# Patient Record
Sex: Male | Born: 1940 | Race: White | Hispanic: No | Marital: Married | Smoking: Former smoker
Health system: Southern US, Community
[De-identification: ages and names within clinical notes are randomized; demographics above are authoritative.]

## PROBLEM LIST (undated history)

## (undated) DIAGNOSIS — R7989 Other specified abnormal findings of blood chemistry: Secondary | ICD-10-CM

## (undated) DIAGNOSIS — Z973 Presence of spectacles and contact lenses: Secondary | ICD-10-CM

## (undated) DIAGNOSIS — F0390 Unspecified dementia without behavioral disturbance: Secondary | ICD-10-CM

## (undated) DIAGNOSIS — R945 Abnormal results of liver function studies: Secondary | ICD-10-CM

## (undated) DIAGNOSIS — Z9889 Other specified postprocedural states: Secondary | ICD-10-CM

## (undated) DIAGNOSIS — M1990 Unspecified osteoarthritis, unspecified site: Secondary | ICD-10-CM

## (undated) DIAGNOSIS — M419 Scoliosis, unspecified: Secondary | ICD-10-CM

## (undated) DIAGNOSIS — T4145XA Adverse effect of unspecified anesthetic, initial encounter: Secondary | ICD-10-CM

## (undated) DIAGNOSIS — T8859XA Other complications of anesthesia, initial encounter: Secondary | ICD-10-CM

## (undated) DIAGNOSIS — R519 Headache, unspecified: Secondary | ICD-10-CM

## (undated) DIAGNOSIS — G4730 Sleep apnea, unspecified: Secondary | ICD-10-CM

## (undated) DIAGNOSIS — R42 Dizziness and giddiness: Secondary | ICD-10-CM

## (undated) DIAGNOSIS — Z9981 Dependence on supplemental oxygen: Secondary | ICD-10-CM

## (undated) DIAGNOSIS — J189 Pneumonia, unspecified organism: Secondary | ICD-10-CM

## (undated) DIAGNOSIS — F419 Anxiety disorder, unspecified: Secondary | ICD-10-CM

## (undated) DIAGNOSIS — C249 Malignant neoplasm of biliary tract, unspecified: Secondary | ICD-10-CM

## (undated) DIAGNOSIS — K589 Irritable bowel syndrome without diarrhea: Secondary | ICD-10-CM

## (undated) DIAGNOSIS — R569 Unspecified convulsions: Secondary | ICD-10-CM

## (undated) DIAGNOSIS — G959 Disease of spinal cord, unspecified: Secondary | ICD-10-CM

## (undated) DIAGNOSIS — R51 Headache: Secondary | ICD-10-CM

## (undated) DIAGNOSIS — K219 Gastro-esophageal reflux disease without esophagitis: Secondary | ICD-10-CM

## (undated) DIAGNOSIS — R112 Nausea with vomiting, unspecified: Secondary | ICD-10-CM

## (undated) DIAGNOSIS — E785 Hyperlipidemia, unspecified: Secondary | ICD-10-CM

## (undated) HISTORY — DX: Hyperlipidemia, unspecified: E78.5

## (undated) HISTORY — PX: COLONOSCOPY: SHX174

## (undated) HISTORY — PX: OSTECTOMY METATARSAL: SUR970

## (undated) HISTORY — PX: CHOLECYSTECTOMY: SHX55

## (undated) HISTORY — PX: FRACTURE SURGERY: SHX138

## (undated) HISTORY — PX: VASECTOMY: SHX75

## (undated) HISTORY — PX: UVULOPALATOPHARYNGOPLASTY (UPPP)/TONSILLECTOMY/SEPTOPLASTY: SHX6164

## (undated) HISTORY — PX: BACK SURGERY: SHX140

## (undated) HISTORY — DX: Dizziness and giddiness: R42

## (undated) HISTORY — PX: TONSILLECTOMY: SUR1361

## (undated) HISTORY — PX: CARDIAC CATHETERIZATION: SHX172

---
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
## 2005-02-22 ENCOUNTER — Emergency Department (HOSPITAL_COMMUNITY): Admission: EM | Admit: 2005-02-22 | Discharge: 2005-02-23 | Payer: Self-pay | Admitting: *Deleted

## 2005-06-29 ENCOUNTER — Emergency Department (HOSPITAL_COMMUNITY): Admission: EM | Admit: 2005-06-29 | Discharge: 2005-06-29 | Payer: Self-pay | Admitting: Emergency Medicine

## 2009-11-12 ENCOUNTER — Encounter: Admission: RE | Admit: 2009-11-12 | Discharge: 2009-11-12 | Payer: Self-pay | Admitting: Gastroenterology

## 2010-10-03 HISTORY — PX: OTHER SURGICAL HISTORY: SHX169

## 2015-05-08 ENCOUNTER — Ambulatory Visit: Payer: Self-pay | Admitting: Urology

## 2017-03-21 ENCOUNTER — Other Ambulatory Visit: Payer: Self-pay | Admitting: Orthopedic Surgery

## 2017-04-07 NOTE — Pre-Procedure Instructions (Signed)
Samuel Olson  04/07/2017      [id number] Angelina Sheriff, [place] [address] [place] [address] Phone: [phone] Fax: [phone]    Your procedure is scheduled on July 16  Report to [place] at 800 A.M.  Call this number if you have problems the morning of surgery:  [phone]   Remember:  Do not eat food or drink liquids after midnight.  Take these medicines the morning of surgery with A SIP OF WATER Baclofen (Lioresal), Buspirone (Buspar), Fluvoxamine (Luvox), Phenobarbital (Luminal), Phenytoin (Dilantin), ranitidine (Zantac)  Stop taking aspirin, BC's, Goody's, herbal medications, Fish Oil, Ibuprofen, Advil, Motrin, Aleve, Vitamins, Celebrex,    Do not wear jewelry, make-up or nail polish.  Do not wear lotions, powders, or perfumes, or deoderant.  Do not shave 48 hours prior to surgery.  Men may shave face and neck.  Do not bring valuables to the hospital.  [facility] is not responsible for any belongings or valuables.  Contacts, dentures or bridgework may not be worn into surgery.  Leave your suitcase in the car.  After surgery it may be brought to your room.  For patients admitted to the hospital, discharge time will be determined by your treatment team.  Patients discharged the day of surgery will not be allowed to drive home.   Special instructions:  [place] - Preparing for Surgery  Before surgery, you can play an important role.  Because skin is not sterile, your skin needs to be as free of germs as possible.  You can reduce the number of germs on you skin by washing with CHG (chlorahexidine gluconate) soap before surgery.  CHG is an antiseptic cleaner which kills germs and bonds with the skin to continue killing germs even after washing.  Please DO NOT use if you have an allergy to CHG or antibacterial soaps.  If your skin becomes reddened/irritated stop using the CHG and inform your  nurse when you arrive at Short Stay.  Do not shave (including legs and underarms) for at least 48 hours prior to the first CHG shower.  You may shave your face.  Please follow these instructions carefully:   1.  Shower with CHG Soap the night before surgery and the                                morning of Surgery.  2.  If you choose to wash your hair, wash your hair first as usual with your       normal shampoo.  3.  After you shampoo, rinse your hair and body thoroughly to remove the                      Shampoo.  4.  Use CHG as you would any other liquid soap.  You can apply chg directly       to the skin and wash gently with scrungie or a clean washcloth.  5.  Apply the CHG Soap to your body ONLY FROM THE NECK DOWN.        Do not use on open wounds or open sores.  Avoid contact with your eyes,       ears, mouth and genitals (private parts).  Wash genitals (private parts)       with your normal soap.  6.  Wash thoroughly, paying  special attention to the area where your surgery        will be performed.  7.  Thoroughly rinse your body with warm water from the neck down.  8.  DO NOT shower/wash with your normal soap after using and rinsing off       the CHG Soap.  9.  Pat yourself dry with a clean towel.            10.  Wear clean pajamas.            11.  Place clean sheets on your bed the night of your first shower and do not        sleep with pets.  Day of Surgery  Do not apply any lotions/deoderants the morning of surgery.  Please wear clean clothes to the hospital/surgery center.     Please read over the following fact sheets that you were given. Pain Booklet, Coughing and Deep Breathing, MRSA Information and Surgical Site Infection Prevention

## 2017-04-10 ENCOUNTER — Encounter (HOSPITAL_COMMUNITY): Payer: Self-pay | Admitting: *Deleted

## 2017-04-10 ENCOUNTER — Encounter (HOSPITAL_COMMUNITY): Payer: Self-pay

## 2017-04-10 ENCOUNTER — Encounter (HOSPITAL_COMMUNITY)
Admission: RE | Admit: 2017-04-10 | Discharge: 2017-04-10 | Disposition: A | Discharge status: Home / Self Care | Payer: Medicare Other | Source: Ambulatory Visit | Attending: Orthopedic Surgery | Admitting: Orthopedic Surgery

## 2017-04-10 DIAGNOSIS — Z01812 Encounter for preprocedural laboratory examination: Secondary | ICD-10-CM | POA: Diagnosis present

## 2017-04-10 DIAGNOSIS — M1711 Unilateral primary osteoarthritis, right knee: Secondary | ICD-10-CM | POA: Diagnosis not present

## 2017-04-10 HISTORY — DX: Gastro-esophageal reflux disease without esophagitis: K21.9

## 2017-04-10 HISTORY — DX: Other complications of anesthesia, initial encounter: T88.59XA

## 2017-04-10 HISTORY — DX: Scoliosis, unspecified: M41.9

## 2017-04-10 HISTORY — DX: Unspecified osteoarthritis, unspecified site: M19.90

## 2017-04-10 HISTORY — DX: Unspecified convulsions: R56.9

## 2017-04-10 HISTORY — DX: Other specified postprocedural states: Z98.890

## 2017-04-10 HISTORY — DX: Pneumonia, unspecified organism: J18.9

## 2017-04-10 HISTORY — DX: Nausea with vomiting, unspecified: R11.2

## 2017-04-10 HISTORY — DX: Adverse effect of unspecified anesthetic, initial encounter: T41.45XA

## 2017-04-10 HISTORY — DX: Irritable bowel syndrome, unspecified: K58.9

## 2017-04-10 HISTORY — DX: Anxiety disorder, unspecified: F41.9

## 2017-04-10 HISTORY — DX: Disease of spinal cord, unspecified: G95.9

## 2017-04-10 HISTORY — DX: Sleep apnea, unspecified: G47.30

## 2017-04-10 LAB — COMPREHENSIVE METABOLIC PANEL
ALT: 26 U/L (ref 17–63)
AST: 36 U/L (ref 15–41)
Albumin: 4.2 g/dL (ref 3.5–5.0)
Alkaline Phosphatase: 98 U/L (ref 38–126)
Anion gap: 8 (ref 5–15)
BILIRUBIN TOTAL: 0.4 mg/dL (ref 0.3–1.2)
BUN: 21 mg/dL — AB (ref 6–20)
CO2: 28 mmol/L (ref 22–32)
Calcium: 9.4 mg/dL (ref 8.9–10.3)
Chloride: 102 mmol/L (ref 101–111)
Creatinine, Ser: 0.81 mg/dL (ref 0.61–1.24)
GFR calc Af Amer: >60 mL/min (ref >60)
GLUCOSE: 86 mg/dL (ref 65–99)
POTASSIUM: 4.8 mmol/L (ref 3.5–5.1)
Sodium: 138 mmol/L (ref 135–145)
TOTAL PROTEIN: 7.4 g/dL (ref 6.5–8.1)

## 2017-04-10 LAB — CBC WITH DIFFERENTIAL/PLATELET
BASOS ABS: 0 10*3/uL (ref 0.0–0.1)
BASOS PCT: 1 %
Eosinophils Absolute: 0.1 10*3/uL (ref 0.0–0.7)
Eosinophils Relative: 2 %
HEMATOCRIT: 43.7 % (ref 39.0–52.0)
HEMOGLOBIN: 13.6 g/dL (ref 13.0–17.0)
LYMPHS PCT: 23 %
Lymphs Abs: 1.1 10*3/uL (ref 0.7–4.0)
MCH: 29.5 pg (ref 26.0–34.0)
MCHC: 31.1 g/dL (ref 30.0–36.0)
MCV: 94.8 fL (ref 78.0–100.0)
Monocytes Absolute: 0.3 10*3/uL (ref 0.1–1.0)
Monocytes Relative: 7 %
NEUTROS ABS: 3.2 10*3/uL (ref 1.7–7.7)
NEUTROS PCT: 67 %
Platelets: 173 10*3/uL (ref 150–400)
RBC: 4.61 MIL/uL (ref 4.22–5.81)
RDW: 14.3 % (ref 11.5–15.5)
WBC: 4.8 10*3/uL (ref 4.0–10.5)

## 2017-04-10 LAB — SURGICAL PCR SCREEN
MRSA, PCR: NEGATIVE
Staphylococcus aureus: NEGATIVE

## 2017-04-10 NOTE — Progress Notes (Signed)
PCP -  Elyn Aquas Cardiologist - Denies  Chest x-ray - Denies EKG - Denies Stress Test - Denies ECHO - Denies Cardiac Cath - Yes, but years ago, per the wife  Sleep Study - Yes CPAP - Denies, but uses 2L of O2 while sleeping due to him not tolerating the cpap machine.   Pt denies having chest pain, sob, or fever at this time. All instructions explained to the pt and wife, with a verbal understanding of the material. Pt and wife agree to go over the instructions while at home for a better understanding. The opportunity to ask questions was provided.

## 2017-04-14 MED ORDER — ACETAMINOPHEN 500 MG PO TABS
1000.0000 mg | ORAL_TABLET | Freq: Once | ORAL | Status: AC
  Administered 2017-04-17: 1000 mg via ORAL
  Filled 2017-04-14: qty 2

## 2017-04-14 MED ORDER — DEXAMETHASONE SODIUM PHOSPHATE 10 MG/ML IJ SOLN
8.0000 mg | Freq: Once | INTRAMUSCULAR | Status: AC
  Administered 2017-04-17: 8 mg via INTRAVENOUS
  Filled 2017-04-14: qty 1

## 2017-04-14 MED ORDER — BUPIVACAINE LIPOSOME 1.3 % IJ SUSP
20.0000 mL | INTRAMUSCULAR | Status: AC
  Administered 2017-04-17: 20 mL
  Filled 2017-04-14: qty 20

## 2017-04-14 MED ORDER — TRANEXAMIC ACID 1000 MG/10ML IV SOLN
1000.0000 mg | INTRAVENOUS | Status: AC
  Administered 2017-04-17: 1000 mg via INTRAVENOUS
  Filled 2017-04-14: qty 10

## 2017-04-14 MED ORDER — GABAPENTIN 300 MG PO CAPS
300.0000 mg | ORAL_CAPSULE | Freq: Once | ORAL | Status: AC
  Administered 2017-04-17: 300 mg via ORAL
  Filled 2017-04-14: qty 1

## 2017-04-16 NOTE — H&P (Signed)
Samuel Olson MRN:  [id number] DOB/SEX:  Jul 18, 1941/male  CHIEF COMPLAINT:  Painful right Knee  HISTORY: Patient is a 76 y.o. male presented with a history of pain in the right knee. Onset of symptoms was gradual starting a few years ago with gradually worsening course since that time. Patient has been treated conservatively with over-the-counter NSAIDs and activity modification. Patient currently rates pain in the knee at 10 out of 10 with activity. There is pain at night.  PAST MEDICAL HISTORY: There are no active problems to display for this patient.  Past Medical History:  Diagnosis Date  . Anxiety   . Arthritis   . 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 Complication of anesthesia   . GERD (gastroesophageal reflux disease)   . IBS (irritable bowel syndrome)   . Pneumonia   . PONV (postoperative nausea and vomiting)   . Scoliosis   . Seizures ([place])   . Sleep apnea    Uses O2 at 2L while sleeping   Past Surgical History:  Procedure Laterality Date  . BACK SURGERY    . CARDIAC CATHETERIZATION    . CHOLECYSTECTOMY    . COLONOSCOPY    . FRACTURE SURGERY     Left plates and screws  . pain stimulator   2012  . pain stimulator   2012   removed 2012  . TONSILLECTOMY    . UVULOPALATOPHARYNGOPLASTY (UPPP)/TONSILLECTOMY/SEPTOPLASTY    . VASECTOMY       MEDICATIONS:   No prescriptions prior to admission.    ALLERGIES:   Allergies  Allergen Reactions  . Promethazine Other (See Comments)    Cramps, shakes.  . Augmentin [Amoxicillin-Pot Clavulanate] Other (See Comments)    UNKNOWN [SEE BELOW]  Has patient had a PCN reaction causing immediate rash, facial/tongue/throat swelling, SOB or lightheadedness with hypotension: Unknown Has patient had a PCN reaction causing severe rash involving mucus membranes or skin necrosis: Uknown PATIENT HAS HAD A PCN REACTION THAT REQUIRED HOSPITALIZATION: >> UNSPECIFIED REACTION WHILE HE WAS ALREADY ADMITTED TO A HOSPITAL.  Has patient had a PCN  reaction occurring within the last 10 years: No   . Oxycodone Other (See Comments)    UNSPECIFIED REACTION  TOLERATES APAP WITHOUT OXYCODONE    REVIEW OF SYSTEMS:  A comprehensive review of systems was negative except for: Musculoskeletal: positive for arthralgias and bone pain   FAMILY HISTORY:  No family history on file.  SOCIAL HISTORY:   Social History  Substance Use Topics  . Smoking status: Former Smoker    Types: Cigarettes  . Smokeless tobacco: Never Used  . Alcohol use No     EXAMINATION:  Vital signs in last 24 hours:    There were no vitals taken for this visit.  General Appearance:    Alert, cooperative, no distress, appears stated age  Head:    Normocephalic, without obvious abnormality, atraumatic  Eyes:    PERRL, conjunctiva/corneas clear, EOM's intact, fundi    benign, both eyes       Ears:    Normal TM's and external ear canals, both ears  Nose:   Nares normal, septum midline, mucosa normal, no drainage    or sinus tenderness  Throat:   Lips, mucosa, and tongue normal; teeth and gums normal  Neck:   Supple, symmetrical, trachea midline, no adenopathy;       thyroid:  No enlargement/tenderness/nodules; no carotid   bruit or JVD  Back:     Symmetric, no curvature, ROM normal, no CVA tenderness  Lungs:     Clear to auscultation bilaterally, respirations unlabored  Chest wall:    No tenderness or deformity  Heart:    Regular rate and rhythm, S1 and S2 normal, no murmur, rub   or gallop  Abdomen:     Soft, non-tender, bowel sounds active all four quadrants,    no masses, no organomegaly  Genitalia:    Normal male without lesion, discharge or tenderness  Rectal:    Normal tone, normal prostate, no masses or tenderness;   guaiac negative stool  Extremities:   Extremities normal, atraumatic, no cyanosis or edema  Pulses:   2+ and symmetric all extremities  Skin:   Skin color, texture, turgor normal, no rashes or lesions  Lymph nodes:   Cervical,  supraclavicular, and axillary nodes normal  Neurologic:   CNII-XII intact. Normal strength, sensation and reflexes      throughout    Musculoskeletal:  ROM 0-120, Ligaments intact,  Imaging Review Plain radiographs demonstrate severe degenerative joint disease of the right knee medial compartment. The overall alignment is mild varus. The bone quality appears to be excellent for age and reported activity level.  Assessment/Plan: Primary osteoarthritis medical compartment, right knee   The patient history, physical examination and imaging studies are consistent with advanced degenerative joint disease of the right knee medial compartment. The patient has failed conservative treatment.  The clearance notes were reviewed.  After discussion with the patient it was felt that partial Knee Replacement was indicated. The procedure,  risks, and benefits of partial knee arthroplasty were presented and reviewed. The risks including but not limited to aseptic loosening, infection, blood clots, vascular injury, stiffness, patella tracking problems complications among others were discussed. The patient acknowledged the explanation, agreed to proceed with the plan.  Donia Ast 04/16/2017, 8:44 PM

## 2017-04-17 ENCOUNTER — Encounter (HOSPITAL_COMMUNITY)
Admission: RE | Disposition: A | Discharge status: Home / Self Care | Payer: Self-pay | Source: Ambulatory Visit | Attending: Orthopedic Surgery

## 2017-04-17 ENCOUNTER — Ambulatory Visit (HOSPITAL_COMMUNITY): Payer: Medicare Other | Admitting: Anesthesiology

## 2017-04-17 ENCOUNTER — Encounter (HOSPITAL_COMMUNITY): Payer: Self-pay

## 2017-04-17 ENCOUNTER — Observation Stay (HOSPITAL_COMMUNITY)
Admission: RE | Admit: 2017-04-17 | Discharge: 2017-04-18 | Disposition: A | Discharge status: Home / Self Care | Payer: Medicare Other | Source: Ambulatory Visit | Attending: Orthopedic Surgery | Admitting: Orthopedic Surgery

## 2017-04-17 DIAGNOSIS — Z79899 Other long term (current) drug therapy: Secondary | ICD-10-CM | POA: Diagnosis not present

## 2017-04-17 DIAGNOSIS — F419 Anxiety disorder, unspecified: Secondary | ICD-10-CM | POA: Insufficient documentation

## 2017-04-17 DIAGNOSIS — M1711 Unilateral primary osteoarthritis, right knee: Secondary | ICD-10-CM | POA: Diagnosis not present

## 2017-04-17 DIAGNOSIS — K219 Gastro-esophageal reflux disease without esophagitis: Secondary | ICD-10-CM | POA: Diagnosis not present

## 2017-04-17 DIAGNOSIS — Z87891 Personal history of nicotine dependence: Secondary | ICD-10-CM | POA: Insufficient documentation

## 2017-04-17 DIAGNOSIS — Z791 Long term (current) use of non-steroidal anti-inflammatories (NSAID): Secondary | ICD-10-CM | POA: Diagnosis not present

## 2017-04-17 DIAGNOSIS — M25561 Pain in right knee: Secondary | ICD-10-CM | POA: Diagnosis present

## 2017-04-17 DIAGNOSIS — G4730 Sleep apnea, unspecified: Secondary | ICD-10-CM | POA: Diagnosis not present

## 2017-04-17 DIAGNOSIS — Z9981 Dependence on supplemental oxygen: Secondary | ICD-10-CM | POA: Diagnosis not present

## 2017-04-17 DIAGNOSIS — Z96651 Presence of right artificial knee joint: Secondary | ICD-10-CM

## 2017-04-17 HISTORY — PX: PARTIAL KNEE ARTHROPLASTY: SHX2174

## 2017-04-17 HISTORY — DX: Dependence on supplemental oxygen: Z99.81

## 2017-04-17 HISTORY — PX: REPLACEMENT UNICONDYLAR JOINT KNEE: SUR1227

## 2017-04-17 SURGERY — ARTHROPLASTY, KNEE, UNICOMPARTMENTAL
Anesthesia: Spinal | Site: Knee | Laterality: Right

## 2017-04-17 MED ORDER — MENTHOL 3 MG MT LOZG: 1.0000 | LOZENGE | OROMUCOSAL | Status: DC | PRN

## 2017-04-17 MED ORDER — LACTATED RINGERS IV SOLN
INTRAVENOUS | Status: DC
  Administered 2017-04-17 (×2): via INTRAVENOUS

## 2017-04-17 MED ORDER — ROPIVACAINE HCL 5 MG/ML IJ SOLN
INTRAMUSCULAR | Status: DC | PRN
  Administered 2017-04-17: 30 mL via PERINEURAL

## 2017-04-17 MED ORDER — FLEET ENEMA 7-19 GM/118ML RE ENEM: 1.0000 | ENEMA | Freq: Once | RECTAL | Status: DC | PRN

## 2017-04-17 MED ORDER — ZOLPIDEM TARTRATE 5 MG PO TABS
5.0000 mg | ORAL_TABLET | Freq: Every evening | ORAL | Status: DC | PRN
Start: 2017-04-17 — End: 2017-04-18

## 2017-04-17 MED ORDER — EPHEDRINE SULFATE 50 MG/ML IJ SOLN
INTRAMUSCULAR | Status: DC | PRN
  Administered 2017-04-17 (×6): 5 mg via INTRAVENOUS

## 2017-04-17 MED ORDER — DEXAMETHASONE SODIUM PHOSPHATE 10 MG/ML IJ SOLN
10.0000 mg | Freq: Once | INTRAMUSCULAR | Status: AC
  Administered 2017-04-18: 10 mg via INTRAVENOUS
  Filled 2017-04-17: qty 1

## 2017-04-17 MED ORDER — ALUM & MAG HYDROXIDE-SIMETH 200-200-20 MG/5ML PO SUSP: 30.0000 mL | ORAL | Status: DC | PRN

## 2017-04-17 MED ORDER — MIDAZOLAM HCL 2 MG/2ML IJ SOLN
INTRAMUSCULAR | Status: AC
  Administered 2017-04-17: 1 mg via INTRAVENOUS
  Filled 2017-04-17: qty 2

## 2017-04-17 MED ORDER — FENTANYL CITRATE (PF) 100 MCG/2ML IJ SOLN
100.0000 ug | Freq: Once | INTRAMUSCULAR | Status: AC
  Administered 2017-04-17: 100 ug via INTRAVENOUS

## 2017-04-17 MED ORDER — PHENYTOIN SODIUM EXTENDED 100 MG PO CAPS
100.0000 mg | ORAL_CAPSULE | Freq: Two times a day (BID) | ORAL | Status: DC
  Administered 2017-04-17 – 2017-04-18 (×2): 100 mg via ORAL
  Filled 2017-04-17 (×2): qty 1

## 2017-04-17 MED ORDER — PROPOFOL 10 MG/ML IV BOLUS
INTRAVENOUS | Status: DC | PRN
  Administered 2017-04-17: 150 mg via INTRAVENOUS

## 2017-04-17 MED ORDER — LIDOCAINE HCL (CARDIAC) 20 MG/ML IV SOLN
INTRAVENOUS | Status: AC
  Filled 2017-04-17: qty 5

## 2017-04-17 MED ORDER — SIMVASTATIN 40 MG PO TABS
40.0000 mg | ORAL_TABLET | Freq: Every evening | ORAL | Status: DC
  Administered 2017-04-17: 40 mg via ORAL
  Filled 2017-04-17: qty 1

## 2017-04-17 MED ORDER — CELECOXIB 200 MG PO CAPS
200.0000 mg | ORAL_CAPSULE | Freq: Two times a day (BID) | ORAL | Status: DC
  Administered 2017-04-17 – 2017-04-18 (×2): 200 mg via ORAL
  Filled 2017-04-17 (×2): qty 1

## 2017-04-17 MED ORDER — MIDAZOLAM HCL 2 MG/2ML IJ SOLN
INTRAMUSCULAR | Status: AC
  Filled 2017-04-17: qty 2

## 2017-04-17 MED ORDER — FAMOTIDINE 20 MG PO TABS
40.0000 mg | ORAL_TABLET | Freq: Every day | ORAL | Status: DC
  Administered 2017-04-17 – 2017-04-18 (×2): 40 mg via ORAL
  Filled 2017-04-17 (×2): qty 2

## 2017-04-17 MED ORDER — CHLORHEXIDINE GLUCONATE 4 % EX LIQD: 60.0000 mL | Freq: Once | CUTANEOUS | Status: DC

## 2017-04-17 MED ORDER — HYDROMORPHONE HCL 1 MG/ML IJ SOLN: 1.0000 mg | INTRAMUSCULAR | Status: DC | PRN

## 2017-04-17 MED ORDER — FLUVOXAMINE MALEATE 100 MG PO TABS
100.0000 mg | ORAL_TABLET | Freq: Every day | ORAL | Status: DC
  Administered 2017-04-17 – 2017-04-18 (×2): 100 mg via ORAL
  Filled 2017-04-17 (×2): qty 1

## 2017-04-17 MED ORDER — MIDAZOLAM HCL 2 MG/2ML IJ SOLN
1.0000 mg | Freq: Once | INTRAMUSCULAR | Status: AC
  Administered 2017-04-17: 1 mg via INTRAVENOUS

## 2017-04-17 MED ORDER — FENTANYL CITRATE (PF) 100 MCG/2ML IJ SOLN
INTRAMUSCULAR | Status: AC
  Filled 2017-04-17: qty 2

## 2017-04-17 MED ORDER — METOCLOPRAMIDE HCL 5 MG/ML IJ SOLN: 5.0000 mg | Freq: Three times a day (TID) | INTRAMUSCULAR | Status: DC | PRN

## 2017-04-17 MED ORDER — EPHEDRINE 5 MG/ML INJ
INTRAVENOUS | Status: AC
  Filled 2017-04-17: qty 10

## 2017-04-17 MED ORDER — ACETAMINOPHEN 325 MG PO TABS: 650.0000 mg | ORAL_TABLET | Freq: Four times a day (QID) | ORAL | Status: DC | PRN

## 2017-04-17 MED ORDER — METHOCARBAMOL 1000 MG/10ML IJ SOLN: 500.0000 mg | Freq: Four times a day (QID) | INTRAMUSCULAR | Status: DC | PRN

## 2017-04-17 MED ORDER — ACETAMINOPHEN 650 MG RE SUPP: 650.0000 mg | Freq: Four times a day (QID) | RECTAL | Status: DC | PRN

## 2017-04-17 MED ORDER — SENNOSIDES-DOCUSATE SODIUM 8.6-50 MG PO TABS: 1.0000 | ORAL_TABLET | Freq: Every evening | ORAL | Status: DC | PRN

## 2017-04-17 MED ORDER — SODIUM CHLORIDE 0.9 % IJ SOLN
INTRAMUSCULAR | Status: DC | PRN
  Administered 2017-04-17: 20 mL via INTRAVENOUS

## 2017-04-17 MED ORDER — FENTANYL CITRATE (PF) 100 MCG/2ML IJ SOLN
INTRAMUSCULAR | Status: DC | PRN
  Administered 2017-04-17: 25 ug via INTRAVENOUS

## 2017-04-17 MED ORDER — DOCUSATE SODIUM 100 MG PO CAPS
100.0000 mg | ORAL_CAPSULE | Freq: Two times a day (BID) | ORAL | Status: DC
  Administered 2017-04-17 – 2017-04-18 (×2): 100 mg via ORAL
  Filled 2017-04-17 (×2): qty 1

## 2017-04-17 MED ORDER — MEPERIDINE HCL 25 MG/ML IJ SOLN: 6.2500 mg | INTRAMUSCULAR | Status: DC | PRN

## 2017-04-17 MED ORDER — HYDROCODONE-ACETAMINOPHEN 7.5-325 MG PO TABS
1.0000 | ORAL_TABLET | Freq: Four times a day (QID) | ORAL | Status: DC
  Administered 2017-04-17 – 2017-04-18 (×4): 1 via ORAL
  Filled 2017-04-17 (×4): qty 1

## 2017-04-17 MED ORDER — ONDANSETRON HCL 4 MG/2ML IJ SOLN
INTRAMUSCULAR | Status: DC | PRN
  Administered 2017-04-17: 4 mg via INTRAVENOUS

## 2017-04-17 MED ORDER — METOCLOPRAMIDE HCL 5 MG PO TABS: 5.0000 mg | ORAL_TABLET | Freq: Three times a day (TID) | ORAL | Status: DC | PRN

## 2017-04-17 MED ORDER — BUPIVACAINE-EPINEPHRINE (PF) 0.25% -1:200000 IJ SOLN
INTRAMUSCULAR | Status: DC | PRN
  Administered 2017-04-17: 30 mL via PERINEURAL

## 2017-04-17 MED ORDER — 0.9 % SODIUM CHLORIDE (POUR BTL) OPTIME
TOPICAL | Status: DC | PRN
  Administered 2017-04-17: 3000 mL
  Administered 2017-04-17: 1000 mL

## 2017-04-17 MED ORDER — PHENOL 1.4 % MT LIQD: 1.0000 | OROMUCOSAL | Status: DC | PRN

## 2017-04-17 MED ORDER — CEFAZOLIN SODIUM-DEXTROSE 2-4 GM/100ML-% IV SOLN
INTRAVENOUS | Status: AC
  Filled 2017-04-17: qty 100

## 2017-04-17 MED ORDER — ONDANSETRON HCL 4 MG/2ML IJ SOLN
INTRAMUSCULAR | Status: AC
  Filled 2017-04-17: qty 2

## 2017-04-17 MED ORDER — BUSPIRONE HCL 5 MG PO TABS
5.0000 mg | ORAL_TABLET | Freq: Two times a day (BID) | ORAL | Status: DC
  Administered 2017-04-17 – 2017-04-18 (×2): 5 mg via ORAL
  Filled 2017-04-17 (×2): qty 1

## 2017-04-17 MED ORDER — CEFAZOLIN SODIUM-DEXTROSE 2-4 GM/100ML-% IV SOLN
2.0000 g | INTRAVENOUS | Status: AC
  Administered 2017-04-17: 2 g via INTRAVENOUS

## 2017-04-17 MED ORDER — BISACODYL 5 MG PO TBEC: 5.0000 mg | DELAYED_RELEASE_TABLET | Freq: Every day | ORAL | Status: DC | PRN

## 2017-04-17 MED ORDER — DIPHENHYDRAMINE HCL 12.5 MG/5ML PO ELIX: 12.5000 mg | ORAL_SOLUTION | ORAL | Status: DC | PRN

## 2017-04-17 MED ORDER — ONDANSETRON HCL 4 MG/2ML IJ SOLN: 4.0000 mg | Freq: Four times a day (QID) | INTRAMUSCULAR | Status: DC | PRN

## 2017-04-17 MED ORDER — ONDANSETRON HCL 4 MG PO TABS: 4.0000 mg | ORAL_TABLET | Freq: Four times a day (QID) | ORAL | Status: DC | PRN

## 2017-04-17 MED ORDER — TRANEXAMIC ACID 1000 MG/10ML IV SOLN
1000.0000 mg | Freq: Once | INTRAVENOUS | Status: AC
  Administered 2017-04-17: 1000 mg via INTRAVENOUS
  Filled 2017-04-17: qty 10

## 2017-04-17 MED ORDER — CLINDAMYCIN PHOSPHATE 600 MG/50ML IV SOLN
600.0000 mg | Freq: Four times a day (QID) | INTRAVENOUS | Status: AC
  Administered 2017-04-17 – 2017-04-18 (×2): 600 mg via INTRAVENOUS
  Filled 2017-04-17 (×2): qty 50

## 2017-04-17 MED ORDER — LIDOCAINE 2% (20 MG/ML) 5 ML SYRINGE
INTRAMUSCULAR | Status: DC | PRN
  Administered 2017-04-17: 100 mg via INTRAVENOUS

## 2017-04-17 MED ORDER — FENTANYL CITRATE (PF) 100 MCG/2ML IJ SOLN
INTRAMUSCULAR | Status: AC
  Administered 2017-04-17: 100 ug via INTRAVENOUS
  Filled 2017-04-17: qty 2

## 2017-04-17 MED ORDER — ASPIRIN EC 325 MG PO TBEC
325.0000 mg | DELAYED_RELEASE_TABLET | Freq: Two times a day (BID) | ORAL | Status: DC
  Administered 2017-04-17 – 2017-04-18 (×2): 325 mg via ORAL
  Filled 2017-04-17 (×2): qty 1

## 2017-04-17 MED ORDER — FENTANYL CITRATE (PF) 100 MCG/2ML IJ SOLN
25.0000 ug | INTRAMUSCULAR | Status: DC | PRN
  Administered 2017-04-17 (×2): 50 ug via INTRAVENOUS

## 2017-04-17 MED ORDER — PHENOBARBITAL 32.4 MG PO TABS
64.8000 mg | ORAL_TABLET | Freq: Two times a day (BID) | ORAL | Status: DC
  Administered 2017-04-17 – 2017-04-18 (×2): 64.8 mg via ORAL
  Filled 2017-04-17 (×3): qty 2

## 2017-04-17 MED ORDER — GABAPENTIN 300 MG PO CAPS
300.0000 mg | ORAL_CAPSULE | Freq: Three times a day (TID) | ORAL | Status: DC
  Administered 2017-04-17 – 2017-04-18 (×3): 300 mg via ORAL
  Filled 2017-04-17 (×3): qty 1

## 2017-04-17 MED ORDER — METHOCARBAMOL 500 MG PO TABS
500.0000 mg | ORAL_TABLET | Freq: Four times a day (QID) | ORAL | Status: DC | PRN
  Administered 2017-04-17 – 2017-04-18 (×2): 500 mg via ORAL
  Filled 2017-04-17 (×2): qty 1

## 2017-04-17 MED ORDER — FENTANYL CITRATE (PF) 250 MCG/5ML IJ SOLN
INTRAMUSCULAR | Status: AC
  Filled 2017-04-17: qty 5

## 2017-04-17 SURGICAL SUPPLY — 63 items
BANDAGE ACE 6X5 VEL STRL LF (GAUZE/BANDAGES/DRESSINGS) ×3 IMPLANT
BANDAGE ESMARK 6X9 LF (GAUZE/BANDAGES/DRESSINGS) ×1 IMPLANT
BEARING TIBIAL IBALANCE SZ5 8M (Miscellaneous) ×3 IMPLANT
BLADE SAW RECIP 87.9 MT (BLADE) IMPLANT
BLADE SAW SGTL 13X75X1.27 (BLADE) IMPLANT
BLADE SAW SGTL 83.5X18.5 (BLADE) IMPLANT
BNDG CMPR MED 10X6 ELC LF (GAUZE/BANDAGES/DRESSINGS) ×1
BNDG ELASTIC 6X10 VLCR STRL LF (GAUZE/BANDAGES/DRESSINGS) ×3 IMPLANT
BNDG ESMARK 6X9 LF (GAUZE/BANDAGES/DRESSINGS) ×3
BOWL SMART MIX CTS (DISPOSABLE) ×3 IMPLANT
CEMENT BONE SIMPLEX SPEEDSET (Cement) ×3 IMPLANT
CLOSURE STERI-STRIP 1/2X4 (GAUZE/BANDAGES/DRESSINGS) ×1
CLSR STERI-STRIP ANTIMIC 1/2X4 (GAUZE/BANDAGES/DRESSINGS) ×2 IMPLANT
COMP FEM CEMENT IBALANCE SZ5 (Knees) ×3 IMPLANT
COMPONENT FEM CMNT IBALNCE SZ5 (Knees) ×1 IMPLANT
COVER SURGICAL LIGHT HANDLE (MISCELLANEOUS) ×6 IMPLANT
CUFF TOURNIQUET SINGLE 34IN LL (TOURNIQUET CUFF) ×3 IMPLANT
DRAPE EXTREMITY T 121X128X90 (DRAPE) ×3 IMPLANT
DRAPE HALF SHEET 40X57 (DRAPES) ×3 IMPLANT
DRAPE IMP U-DRAPE 54X76 (DRAPES) ×3 IMPLANT
DRAPE INCISE IOBAN 66X45 STRL (DRAPES) ×6 IMPLANT
DRAPE U-SHAPE 47X51 STRL (DRAPES) ×3 IMPLANT
DRSG AQUACEL AG ADV 3.5X10 (GAUZE/BANDAGES/DRESSINGS) ×3 IMPLANT
DURAPREP 26ML APPLICATOR (WOUND CARE) ×6 IMPLANT
ELECT REM PT RETURN 9FT ADLT (ELECTROSURGICAL) ×3
ELECTRODE REM PT RTRN 9FT ADLT (ELECTROSURGICAL) ×1 IMPLANT
FLUID NSS /IRRIG 3000 ML XXX (IV SOLUTION) ×3 IMPLANT
GLOVE BIOGEL M 7.0 STRL (GLOVE) IMPLANT
GLOVE BIOGEL PI IND STRL 7.5 (GLOVE) IMPLANT
GLOVE BIOGEL PI IND STRL 8.5 (GLOVE) ×4 IMPLANT
GLOVE BIOGEL PI INDICATOR 7.5 (GLOVE)
GLOVE BIOGEL PI INDICATOR 8.5 (GLOVE) ×8
GLOVE SURG ORTHO 8.0 STRL STRW (GLOVE) ×12 IMPLANT
GOWN STRL REUS W/ TWL LRG LVL3 (GOWN DISPOSABLE) ×1 IMPLANT
GOWN STRL REUS W/ TWL XL LVL3 (GOWN DISPOSABLE) ×2 IMPLANT
GOWN STRL REUS W/TWL 2XL LVL3 (GOWN DISPOSABLE) ×3 IMPLANT
GOWN STRL REUS W/TWL LRG LVL3 (GOWN DISPOSABLE) ×2
GOWN STRL REUS W/TWL XL LVL3 (GOWN DISPOSABLE) ×4
HANDPIECE INTERPULSE COAX TIP (DISPOSABLE) ×3
HOOD PEEL AWAY FACE SHEILD DIS (HOOD) ×9 IMPLANT
KIT BASIN OR (CUSTOM PROCEDURE TRAY) ×3 IMPLANT
KIT ROOM TURNOVER OR (KITS) ×3 IMPLANT
MANIFOLD NEPTUNE II (INSTRUMENTS) ×3 IMPLANT
NEEDLE 21X1 OR PACK (NEEDLE) ×3 IMPLANT
NEEDLE HYPO 21X1 ECLIPSE (NEEDLE) ×3 IMPLANT
NS IRRIG 1000ML POUR BTL (IV SOLUTION) ×3 IMPLANT
PACK TOTAL JOINT (CUSTOM PROCEDURE TRAY) ×3 IMPLANT
PAD ARMBOARD 7.5X6 YLW CONV (MISCELLANEOUS) ×6 IMPLANT
SET HNDPC FAN SPRY TIP SCT (DISPOSABLE) ×1 IMPLANT
SUCTION FRAZIER HANDLE 10FR (MISCELLANEOUS) ×2
SUCTION TUBE FRAZIER 10FR DISP (MISCELLANEOUS) ×1 IMPLANT
SUT MNCRL AB 3-0 PS2 18 (SUTURE) ×3 IMPLANT
SUT VIC AB 0 CT1 27 (SUTURE) ×3
SUT VIC AB 0 CT1 27XBRD ANBCTR (SUTURE) ×1 IMPLANT
SUT VIC AB 1 CT1 27 (SUTURE) ×3
SUT VIC AB 1 CT1 27XBRD ANBCTR (SUTURE) ×1 IMPLANT
SUT VIC AB 2-0 CT1 27 (SUTURE) ×2
SUT VIC AB 2-0 CT1 TAPERPNT 27 (SUTURE) ×1 IMPLANT
SYR 20CC LL (SYRINGE) ×6 IMPLANT
TOWEL OR 17X24 6PK STRL BLUE (TOWEL DISPOSABLE) ×3 IMPLANT
TOWEL OR 17X26 10 PK STRL BLUE (TOWEL DISPOSABLE) ×3 IMPLANT
TRAY TIBIAL UKA SZ5 RIGHT MED (Knees) ×3 IMPLANT
WRAP KNEE MAXI GEL POST OP (GAUZE/BANDAGES/DRESSINGS) ×3 IMPLANT

## 2017-04-17 NOTE — Progress Notes (Signed)
Orthopedic Tech Progress Note Patient Details:  Samuel Olson 01-02-1941 [id number]  CPM Right Knee CPM Right Knee: On Right Knee Flexion (Degrees): 90 Right Knee Extension (Degrees): 0 Additional Comments: trapeze bar patient helper   [name] 04/17/2017, 1:58 PM Viewed order from doctor's order list

## 2017-04-17 NOTE — Op Note (Addendum)
UNI KNEE REPLACEMENT OPERATIVE NOTE:  04/17/2017  2:18 PM  PATIENT:  Samuel Olson  76 y.o. male  PRE-OPERATIVE DIAGNOSIS:  primary osteoarthritis right knee  POST-OPERATIVE DIAGNOSIS:  primary osteoarthritis right knee  PROCEDURE:  Procedure(s): UNICOMPARTMENTAL KNEE  SURGEON:  Surgeon(s): Vickey Huger, MD  PHYSICIAN ASSISTANT: Carlyon Shadow, [facility]   ANESTHESIA:   spinal  DRAINS: Hemovac  SPECIMEN: None  COUNTS:  Correct  TOURNIQUET:   Total Tourniquet Time Documented: Thigh (Right) - 44 minutes Total: Thigh (Right) - 44 minutes   DICTATION:  Indication for procedure:    The patient is a 76 y.o. male who has failed conservative treatment for primary osteoarthritis right knee.  Informed consent was obtained prior to anesthesia. The risks versus benefits of the operation were explain and in a way the patient can, and did, understand.   On the implant demand matching protocol, this patient scored 10.  Therefore, this patient was not receive a polyethylene insert with vitamin E which is a high demand implant.  Description of procedure:     The patient was taken to the operating room and placed under anesthesia.  The patient was positioned in the usual fashion taking care that all body parts were adequately padded and/or protected.  I foley catheter was not placed.  A tourniquet was applied and the leg prepped and draped in the usual sterile fashion.  The extremity was exsanguinated with the esmarch and tourniquet inflated to 350 mmHg.  Pre-operative range of motion was normal.  The knee was in 5 degree of mild varus.  A midline incision approximately 3-4 inches long was made with a #10 blade.  A new blade was used to make a parapatellar arthrotomy going 1 cm into the quadriceps tendon, over the patella, and alongside the medial aspect of the patellar tendon.  A synovectomy was then performed with the #10 blade and forceps. I then elevated the deep MCL off the medial tibial flare.  The knee was put at 90 degrees and the patient specific cutting blocks were used to make our proximal tibial cut and distal femoral cut. The medial meniscus was removed at this point.  I then used the #5 cutting guide on the femur to drill for lugs and cut the chamfers. Likewise, a #5  tibial baseplate was used to prepare the tibia. I then trialed the #5 frmur and #5 tibia. I trialed several poly inserts and a 8 mm achieved good balance in flexion and extension.  I then irrigated copiously and then mixed the cement. I injected exparel in the deep soft tissues at this point. I then cemented the tibia first followed by the femur and removed excess cement and then inserted the polyethylene. I placed the leg in extension and finished injecting the rest of the exparel.  BLOOD LOSS:  300cc DRAINS: 1 hemovac, 1 pain catheter COMPLICATIONS:  None.  PLAN OF CARE: Admit to inpatient   PATIENT DISPOSITION:  PACU - hemodynamically stable.   Delay start of Pharmacological VTE agent (>24hrs) due to surgical blood loss or risk of bleeding:  not applicable  Please fax a copy of this op note to my office at [phone] (please only include page 1 and 2 of the Case Information op note)

## 2017-04-17 NOTE — Evaluation (Addendum)
Physical Therapy Evaluation Patient Details Name: Samuel Olson MRN: [id number] DOB: September 01, 1941 Today's Date: 04/17/2017   History of Present Illness  Patient is a 76 y/o male who presents s/p Right unicompartmental knee replacement. PMH includes seizures, sleep apnea, anxiety, cervical myelopathy.  Clinical Impression  Patient presents with pain and post surgical deficits s/p above surgery. Tolerated gait training with Min guard assist for safety and cues for RW management. Instructed pt in exercises. Pt has support of spouse at home. Pt independent and volunteers 6 days/week PTA. Lengthy discussion re: problem solving bathroom setup, how to use BSC over toilet, get in/out of bed, knee precautions, zero degree knee, exercises etc. Plan for stair training and bed mobility tomorrow prior to d/c. Also will plan to bring exercise handout. Will follow to maximize independence and mobility prior to return home.     Follow Up Recommendations DC plan and follow up therapy as arranged by surgeon;Supervision for mobility/OOB    Equipment Recommendations  None recommended by PT    Recommendations for Other Services       Precautions / Restrictions Precautions Precautions: Knee Precaution Booklet Issued: No Precaution Comments: Reviewed no pillow under knee and precautions. Restrictions Weight Bearing Restrictions: Yes RLE Weight Bearing: Weight bearing as tolerated      Mobility  Bed Mobility               General bed mobility comments: Up in chair upon PT arrival.   Transfers Overall transfer level: Needs assistance Equipment used: Rolling walker (2 wheeled) Transfers: Sit to/from Stand Sit to Stand: Min guard         General transfer comment: Min guard to steady in standing. Stood from Automotive engineer.  Ambulation/Gait Ambulation/Gait assistance: Min guard Ambulation Distance (Feet): 100 Feet (+30') Assistive device: Rolling walker (2 wheeled) Gait Pattern/deviations: Step-to  pattern;Step-through pattern;Decreased stance time - right;Decreased step length - left;Trunk flexed Gait velocity: decreased Gait velocity interpretation: Below normal speed for age/gender General Gait Details: Cues for knee extension during stance phase to activate quads. Cues for RW management esp during turns.  Stairs            Wheelchair Mobility    Modified Rankin (Stroke Patients Only)       Balance Overall balance assessment: Needs assistance Sitting-balance support: Feet supported;No upper extremity supported Sitting balance-Leahy Scale: Good     Standing balance support: During functional activity;Single extremity supported Standing balance-Leahy Scale: Fair Standing balance comment: Able to stand at toilet and urinate with 1 UE support; requires UE support for ambulation for safety.                              Pertinent Vitals/Pain Pain Assessment: Faces Faces Pain Scale: Hurts even more Pain Location: right knee Pain Descriptors / Indicators: Sore;Operative site guarding;Aching Pain Intervention(s): Monitored during session;Repositioned;Limited activity within patient's tolerance;Relaxation    Home Living Family/patient expects to be discharged to:: Private residence Living Arrangements: Spouse/significant other;Children Available Help at Discharge: Family;Available 24 hours/day Type of Home: House Home Access: Stairs to enter Entrance Stairs-Rails: Can reach both Entrance Stairs-Number of Steps: 3-4 Home Layout: One level Home Equipment: Walker - 2 wheels      Prior Function Level of Independence: Independent         Comments: Volunteers 5 days/week with disaster relief.     Hand Dominance        Extremity/Trunk Assessment   Upper Extremity Assessment  Upper Extremity Assessment: Defer to OT evaluation    Lower Extremity Assessment Lower Extremity Assessment: RLE deficits/detail RLE Deficits / Details: Able to perform  SLR. RLE Sensation:  [facility])       Communication   Communication: HOH  Cognition Arousal/Alertness: Awake/alert Behavior During Therapy: WFL for tasks assessed/performed Overall Cognitive Status: Within Functional Limits for tasks assessed                                 General Comments: Has memory issues per wife      General Comments General comments (skin integrity, edema, etc.): Wife present during session.     Exercises Total Joint Exercises Ankle Circles/Pumps: Both;10 reps;Seated Quad Sets: Both;10 reps;Seated Gluteal Sets: Both;5 reps;Seated   Assessment/Plan    PT Assessment Patient needs continued PT services  PT Problem List Decreased strength;Decreased mobility;Decreased range of motion;Pain;Decreased balance;Decreased knowledge of use of DME;Decreased knowledge of precautions       PT Treatment Interventions DME instruction;Therapeutic activities;Gait training;Therapeutic exercise;Patient/family education;Stair training;Balance training;Functional mobility training    PT Goals (Current goals can be found in the Care Plan section)  Acute Rehab PT Goals Patient Stated Goal: to go home PT Goal Formulation: With patient Time For Goal Achievement: 05/01/17 Potential to Achieve Goals: Good    Frequency 7X/week   Barriers to discharge Inaccessible home environment stairs to enter home    Co-evaluation               AM-PAC PT "6 Clicks" Daily Activity  Outcome Measure Difficulty turning over in bed (including adjusting bedclothes, sheets and blankets)?: None Difficulty moving from lying on back to sitting on the side of the bed? : None Difficulty sitting down on and standing up from a chair with arms (e.g., wheelchair, bedside commode, etc,.)?: None Help needed moving to and from a bed to chair (including a wheelchair)?: A Little Help needed walking in hospital room?: A Little Help needed climbing 3-5 steps with a railing? : A Lot 6  Click Score: 20    End of Session Equipment Utilized During Treatment: Gait belt Activity Tolerance: Patient tolerated treatment well Patient left: in chair;with call bell/phone within reach;with family/visitor present Nurse Communication: Mobility status PT Visit Diagnosis: Pain;Difficulty in walking, not elsewhere classified (R26.2) Pain - Right/Left: Right Pain - part of body: Knee    Time: 5809-9833 PT Time Calculation (min) (ACUTE ONLY): 37 min   Charges:   PT Evaluation $PT Eval Low Complexity: 1 Procedure PT Treatments $Gait Training: 8-22 mins   PT G Codes:   PT G-Codes **NOT FOR INPATIENT CLASS** Functional Assessment Tool Used: Clinical judgement Functional Limitation: Mobility: Walking and moving around Mobility: Walking and Moving Around Current Status (A2505): At least 20 percent but less than 40 percent impaired, limited or restricted Mobility: Walking and Moving Around Goal Status [phone]): At least 1 percent but less than 20 percent impaired, limited or restricted    [place], [place], [place] [phone]    Lacie Draft 04/17/2017, 4:03 PM

## 2017-04-17 NOTE — Progress Notes (Signed)
Orthopedic Tech Progress Note Patient Details:  Samuel Olson Jun 25, 1941 [id number]  CPM Right Knee CPM Right Knee: On Right Knee Flexion (Degrees): 90 Right Knee Extension (Degrees): 0 Additional Comments: Right knee.  pt tolerated application well.  Informed pt at 12am he should request to come off CPM.     Kristopher Oppenheim 04/17/2017, 9:50 PM

## 2017-04-17 NOTE — Anesthesia Preprocedure Evaluation (Addendum)
Anesthesia Evaluation  Patient identified by MRN, date of birth, ID band Patient awake    Reviewed: Allergy & Precautions, NPO status , Patient's Chart, lab work & pertinent test results  History of Anesthesia Complications (+) PONV and history of anesthetic complications  Airway Mallampati: II  TM Distance: >3 FB Neck ROM: Full    Dental no notable dental hx.    Pulmonary neg pulmonary ROS, sleep apnea and Oxygen sleep apnea , former smoker,    Pulmonary exam normal breath sounds clear to auscultation       Cardiovascular negative cardio ROS Normal cardiovascular exam Rhythm:Regular Rate:Normal     Neuro/Psych Seizures -, Well Controlled,  Anxiety negative neurological ROS  negative psych ROS   GI/Hepatic negative GI ROS, Neg liver ROS, GERD  ,  Endo/Other  negative endocrine ROS  Renal/GU negative Renal ROS  negative genitourinary   Musculoskeletal negative musculoskeletal ROS (+)   Abdominal   Peds negative pediatric ROS (+)  Hematology negative hematology ROS (+)   Anesthesia Other Findings   Reproductive/Obstetrics negative OB ROS                             Anesthesia Physical Anesthesia Plan  ASA: III  Anesthesia Plan: Spinal and General   Post-op Pain Management:  Regional for Post-op pain   Induction: Intravenous  PONV Risk Score and Plan: 3 and Ondansetron, Dexamethasone, Propofol, Midazolam and Treatment may vary due to age or medical condition  Airway Management Planned: Nasal Cannula and Natural Airway  Additional Equipment:   Intra-op Plan:   Post-operative Plan:   Informed Consent: I have reviewed the patients History and Physical, chart, labs and discussed the procedure including the risks, benefits and alternatives for the proposed anesthesia with the patient or authorized representative who has indicated his/her understanding and acceptance.   Dental  advisory given  Plan Discussed with: CRNA and Surgeon  Anesthesia Plan Comments: (  )       Anesthesia Quick Evaluation

## 2017-04-17 NOTE — Transfer of Care (Signed)
Immediate Anesthesia Transfer of Care Note  Patient: Samuel Olson  Procedure(s) Performed: Procedure(s): UNICOMPARTMENTAL KNEE (Right)  Patient Location: PACU  Anesthesia Type:GA combined with regional for post-op pain  Level of Consciousness: drowsy and patient cooperative  Airway & Oxygen Therapy: Patient Spontanous Breathing and Patient connected to nasal cannula oxygen  Post-op Assessment: Report given to RN and Post -op Vital signs reviewed and stable  Post vital signs: Reviewed and stable  Last Vitals:  Vitals:   04/17/17 0920 04/17/17 1232  BP: 128/62   Pulse: (!) 47   Resp: 11   Temp:  (!) (P) 36.1 C    Last Pain:  Vitals:   04/17/17 0817  TempSrc: Oral         Complications: No apparent anesthesia complications

## 2017-04-17 NOTE — Anesthesia Procedure Notes (Signed)
Procedure Name: LMA Insertion Date/Time: 04/17/2017 11:03 AM Performed by: Candis Shine Pre-anesthesia Checklist: Patient identified, Emergency Drugs available, Suction available and Patient being monitored Patient Re-evaluated:Patient Re-evaluated prior to induction Oxygen Delivery Method: Circle System Utilized Preoxygenation: Pre-oxygenation with 100% oxygen Induction Type: IV induction Ventilation: Mask ventilation without difficulty LMA: LMA inserted LMA Size: 4.0 Number of attempts: 1 Placement Confirmation: positive ETCO2 Tube secured with: Tape Dental Injury: Teeth and Oropharynx as per pre-operative assessment

## 2017-04-17 NOTE — Anesthesia Procedure Notes (Signed)
Anesthesia Regional Block: Adductor canal block   Pre-Anesthetic Checklist: ,, timeout performed, Correct Patient, Correct Site, Correct Laterality, Correct Procedure, Correct Position, site marked, Risks and benefits discussed,  Surgical consent,  Pre-op evaluation,  At surgeon's request and post-op pain management  Laterality: Right  Prep: chloraprep       Needles:  Injection technique: Single-shot  Needle Type: Echogenic Stimulator Needle     Needle Length: 5cm  Needle Gauge: 22     Additional Needles:   Procedures: ultrasound guided, nerve stimulator,,,,,,  Narrative:  Start time: 04/17/2017 9:09 AM End time: 04/17/2017 9:19 AM Injection made incrementally with aspirations every 5 mL.  Performed by: Personally  Anesthesiologist: Keva Darty  Additional Notes: Functioning IV was confirmed and monitors were applied.  A 43mm 22ga Arrow echogenic stimulator needle was used. Sterile prep and drape,hand hygiene and sterile gloves were used. Ultrasound guidance: relevant anatomy identified, needle position confirmed, local anesthetic spread visualized around nerve(s)., vascular puncture avoided.  Image printed for medical record. Negative aspiration and negative test dose prior to incremental administration of local anesthetic. The patient tolerated the procedure well.

## 2017-04-17 NOTE — Progress Notes (Signed)
Pt admitted to the unit from pacu; pt A&O x4; pt oriented to the unit and room; fall/safety precaution and prevention education completed with pt and family at bedside; CPM; IV intact and transfusing; SCD's ON; right knee incision has compression dsg clean, dry and intact with no stain or drainage noted. Skin intact with no pressure ulcer or other opened wound noted; pt due to void per report; I&O cathed at 1300; call light within reach, will continue to closely monitor. Delia Heady RN

## 2017-04-17 NOTE — Progress Notes (Signed)
Pt voided 348ml in urinal; PVR >958ml; pt assisted to the BR and voided unmeasured. Pt voices relieve and sitting up in chair with family at bedside. Will continue to closely monitor. Delia Heady RN

## 2017-04-17 NOTE — Anesthesia Postprocedure Evaluation (Signed)
Anesthesia Post Note  Patient: Samuel Olson  Procedure(s) Performed: Procedure(s) (LRB): UNICOMPARTMENTAL KNEE (Right)     Patient location during evaluation: PACU Anesthesia Type: General and Regional Level of consciousness: oriented and awake and alert Pain management: pain level controlled Vital Signs Assessment: post-procedure vital signs reviewed and stable Respiratory status: spontaneous breathing, respiratory function stable and patient connected to nasal cannula oxygen Cardiovascular status: blood pressure returned to baseline and stable Postop Assessment: no headache and no backache Anesthetic complications: no    Last Vitals:  Vitals:   04/17/17 1353 04/17/17 1409  BP:  (!) 153/77  Pulse: (!) 58 67  Resp: 10 18  Temp: (!) 36.1 C 36.4 C    Last Pain:  Vitals:   04/17/17 1409  TempSrc: Oral  PainSc:                  Kelliann Pendergraph

## 2017-04-18 DIAGNOSIS — M1711 Unilateral primary osteoarthritis, right knee: Secondary | ICD-10-CM | POA: Diagnosis not present

## 2017-04-18 LAB — CBC
HEMATOCRIT: 35.4 % — AB (ref 39.0–52.0)
HEMOGLOBIN: 11.3 g/dL — AB (ref 13.0–17.0)
MCH: 29.2 pg (ref 26.0–34.0)
MCHC: 31.9 g/dL (ref 30.0–36.0)
MCV: 91.5 fL (ref 78.0–100.0)
Platelets: 140 10*3/uL — ABNORMAL LOW (ref 150–400)
RBC: 3.87 MIL/uL — ABNORMAL LOW (ref 4.22–5.81)
RDW: 13.9 % (ref 11.5–15.5)
WBC: 8.1 10*3/uL (ref 4.0–10.5)

## 2017-04-18 LAB — BASIC METABOLIC PANEL
ANION GAP: 6 (ref 5–15)
BUN: 15 mg/dL (ref 6–20)
CHLORIDE: 102 mmol/L (ref 101–111)
CO2: 27 mmol/L (ref 22–32)
Calcium: 8.6 mg/dL — ABNORMAL LOW (ref 8.9–10.3)
Creatinine, Ser: 0.79 mg/dL (ref 0.61–1.24)
GFR calc non Af Amer: >60 mL/min (ref >60)
Glucose, Bld: 88 mg/dL (ref 65–99)
Potassium: 3.9 mmol/L (ref 3.5–5.1)
Sodium: 135 mmol/L (ref 135–145)

## 2017-04-18 MED ORDER — HYDROCODONE-ACETAMINOPHEN 7.5-325 MG PO TABS: 1.0000 | ORAL_TABLET | Freq: Four times a day (QID) | ORAL | 0 refills | Status: DC | PRN

## 2017-04-18 MED ORDER — ASPIRIN 325 MG PO TBEC: 325.0000 mg | DELAYED_RELEASE_TABLET | Freq: Two times a day (BID) | ORAL | 0 refills | Status: DC

## 2017-04-18 MED ORDER — METHOCARBAMOL 500 MG PO TABS: 500.0000 mg | ORAL_TABLET | Freq: Four times a day (QID) | ORAL | 0 refills | Status: DC | PRN

## 2017-04-18 NOTE — Progress Notes (Signed)
Occupational Therapy Treatment Patient Details Name: Samuel Olson MRN: [id number] DOB: [birth year] Today's Date: 04/18/2017    History of present illness Patient is a 76 y/o male who presents s/p Right unicompartmental knee replacement. PMH includes seizures, sleep apnea, anxiety, cervical myelopathy.   OT comments  Patient evaluated by Occupational Therapy with no further acute OT needs identified. All education has been completed and the patient has no further questions. See below for any follow-up Occupational Therapy or equipment needs. OT to sign off. Thank you for referral.    Follow Up Recommendations  No OT follow up    Equipment Recommendations  None recommended by OT    Recommendations for Other Services      Precautions / Restrictions Precautions Precautions: Knee Precaution Booklet Issued: Yes (comment) Precaution Comments: Reviewed no pillow under knee and precautions and handout Restrictions Weight Bearing Restrictions: Yes RLE Weight Bearing: Weight bearing as tolerated       Mobility Bed Mobility Overal bed mobility: Needs Assistance Bed Mobility: Sit to Supine       Sit to supine: Modified independent (Device/Increase time)   General bed mobility comments: HOB flat, no use of rails for support.   Transfers Overall transfer level: Needs assistance Equipment used: Rolling walker (2 wheeled) Transfers: Sit to/from Stand Sit to Stand: Supervision         General transfer comment: Supervision for safety. Stood from chair x2, from EOB x1.     Balance Overall balance assessment: Needs assistance Sitting-balance support: Feet supported;No upper extremity supported Sitting balance-Leahy Scale: Good     Standing balance support: During functional activity;Single extremity supported Standing balance-Leahy Scale: Good Standing balance comment: able to complete toileting and bathing at sink level                           ADL either  performed or assessed with clinical judgement   ADL Overall ADL's : Needs assistance/impaired Eating/Feeding: Independent   Grooming: Independent   Upper Body Bathing: Independent   Lower Body Bathing: Supervison/ safety   Upper Body Dressing : Independent   Lower Body Dressing: Supervision/safety   Toilet Transfer: Supervision/safety       Tub/ Shower Transfer: English as a second language teacher Details (indicate cue type and reason): educated on sequence and positioning Functional mobility during ADLs: Supervision/safety General ADL Comments: pt completed full adl at sink level. pt did ask for help washing his back and hair   Pt educated on bathing and avoid washing directly on incision. Pt educated to use new wash cloth and towel each day. Pt educated to allow water to run across dressing and not to soak in a tub at this time.      Vision Baseline Vision/History: Wears glasses Wears Glasses: At all times     Perception     Praxis      Cognition Arousal/Alertness: Awake/alert Behavior During Therapy: WFL for tasks assessed/performed Overall Cognitive Status: Within Functional Limits for tasks assessed                                 General Comments: Has memory issues per wife        Exercises Total Joint Exercises Ankle Circles/Pumps: 10 reps;Seated;Both Quad Sets: Both;10 reps;Supine;Strengthening Towel Squeeze: Both;10 reps;Supine Heel Slides: Right;5 reps;Supine Hip ABduction/ADduction: Right;5 reps;Supine Knee Flexion: Right;5 reps;Seated Goniometric ROM: 3- 105 degrees knee AROM   Shoulder Instructions  General Comments educated on washing and wound site    Pertinent Vitals/ Pain       Pain Assessment: Faces Pain Score: 7  Faces Pain Scale: Hurts little more Pain Location: right knee Pain Descriptors / Indicators: Sore;Operative site guarding;Aching Pain Intervention(s): Monitored during session;Premedicated before  session;Repositioned  Home Living Family/patient expects to be discharged to:: Private residence Living Arrangements: Spouse/significant other;Children Available Help at Discharge: Family;Available 24 hours/day Type of Home: House Home Access: Stairs to enter [employer] of Steps: 3-4 Entrance Stairs-Rails: Can reach both Home Layout: One level     Bathroom Shower/Tub: Occupational psychologist: Standard     Home Equipment: Environmental consultant - 2 wheels   Additional Comments: wife to (A) with any needs      Prior Functioning/Environment Level of Independence: Independent        Comments: Volunteers 5 days/week with disaster relief.   Frequency           Progress Toward Goals  OT Goals(current goals can now be found in the care plan section)     Acute Rehab OT Goals Patient Stated Goal: to go home Potential to Achieve Goals: Good  Plan      Co-evaluation                 AM-PAC PT "6 Clicks" Daily Activity     Outcome Measure   Help from another person eating meals?: None Help from another person taking care of personal grooming?: None Help from another person toileting, which includes using toliet, bedpan, or urinal?: None Help from another person bathing (including washing, rinsing, drying)?: None Help from another person to put on and taking off regular upper body clothing?: None Help from another person to put on and taking off regular lower body clothing?: None 6 Click Score: 24    End of Session Equipment Utilized During Treatment: Gait belt;Rolling walker CPM Right Knee CPM Right Knee: Off Right Knee Flexion (Degrees): 90 Right Knee Extension (Degrees): 0  OT Visit Diagnosis: Unsteadiness on feet (R26.81)   Activity Tolerance Patient tolerated treatment well   Patient Left in bed;with call bell/phone within reach;with family/visitor present   Nurse Communication Mobility status;Precautions    Functional Assessment Tool  Used: Clinical judgement Functional Limitation: Self care Self Care Current Status (V6160): 0 percent impaired, limited or restricted Self Care Goal Status (V3710): 0 percent impaired, limited or restricted Self Care Discharge Status [phone]): 0 percent impaired, limited or restricted   Time: 8546-2703 OT Time Calculation (min): 36 min  Charges: OT G-codes **NOT FOR INPATIENT CLASS** Functional Assessment Tool Used: Clinical judgement Functional Limitation: Self care Self Care Current Status (J0093): 0 percent impaired, limited or restricted Self Care Goal Status (G1829): 0 percent impaired, limited or restricted Self Care Discharge Status (H3716): 0 percent impaired, limited or restricted OT General Charges $OT Visit: 1 Procedure OT Evaluation $OT Eval Moderate Complexity: 1 Procedure OT Treatments $Self Care/Home Management : 8-22 mins   Jeri Modena   OTR/L Pager: [phone] Office: [phone] .    Parke Poisson B 04/18/2017, 12:02 PM

## 2017-04-18 NOTE — Progress Notes (Signed)
Pt dioscharged home with wife, all DME delivered to house. OP PT set up by CM.  AVS reviewed with patient and wife. Scripts given to patient. All questions answered.   Blood pressure 126/66, pulse 65, temperature (!) 97.5 F (36.4 C), temperature source Oral, resp. rate 16, height 5\' 10"  (1.778 m), weight 83.1 kg (183 lb 3.2 oz), SpO2 99 %.

## 2017-04-18 NOTE — Discharge Summary (Signed)
SPORTS MEDICINE & JOINT REPLACEMENT   Samuel Mulch, MD   Carlyon Shadow, PA-C [place], [place],   [address]                             [phone]  PATIENT ID: Samuel Olson        MRN:  [id number]          DOB/AGE: April 13, 1941 / 76 y.o.    DISCHARGE SUMMARY  ADMISSION DATE:    04/17/2017 DISCHARGE DATE:   04/18/2017   ADMISSION DIAGNOSIS: primary osteoarthritis right knee    DISCHARGE DIAGNOSIS:  primary osteoarthritis right knee    ADDITIONAL DIAGNOSIS: Active Problems:   S/P right unicompartmental knee replacement  Past Medical History:  Diagnosis Date  . Anxiety   . Arthritis   . Cervical myelopathy ([place])   . Complication of anesthesia   . GERD (gastroesophageal reflux disease)   . IBS (irritable bowel syndrome)   . On home oxygen therapy    "2L when I sleep" (04/17/2017)  . Pneumonia   . PONV (postoperative nausea and vomiting)   . Scoliosis   . Seizures ([place])   . Sleep apnea    Uses O2 at 2L while sleeping; "can't tolerate CPAP OR BIPAP" * (04/17/2017)    PROCEDURE: Procedure(s): UNICOMPARTMENTAL KNEE on 04/17/2017  CONSULTS:    HISTORY:  See H&P in chart  HOSPITAL COURSE:  [name] is a 76 y.o. admitted on 04/17/2017 and found to have a diagnosis of primary osteoarthritis right knee.  After appropriate laboratory studies were obtained  they were taken to the operating room on 04/17/2017 and underwent Procedure(s): UNICOMPARTMENTAL KNEE.   They were given perioperative antibiotics:  Anti-infectives    Start     Dose/Rate Route Frequency Ordered Stop   04/17/17 1700  clindamycin (CLEOCIN) IVPB 600 mg     600 mg 100 mL/hr over 30 Minutes Intravenous Every 6 hours 04/17/17 1430 04/18/17 0111   04/17/17 0900  ceFAZolin (ANCEF) IVPB 2g/100 mL premix     2 g 200 mL/hr over 30 Minutes Intravenous On call to O.R. 04/17/17 0750 04/17/17 1135   04/17/17 0755  ceFAZolin (ANCEF) 2-4 GM/100ML-% IVPB    Comments:  Tamsen Snider   : cabinet override       04/17/17 0755 04/17/17 1105    .  Patient given tranexamic acid IV or topical and exparel intra-operatively.  Tolerated the procedure well.    POD# 1: Vital signs were stable.  Patient denied Chest pain, shortness of breath, or calf pain.  Patient was started on Lovenox 30 mg subcutaneously twice daily at 8am.  Consults to PT, OT, and care management were made.  The patient was weight bearing as tolerated.  CPM was placed on the operative leg 0-90 degrees for 6-8 hours a day. When out of the CPM, patient was placed in the foam block to achieve full extension. Incentive spirometry was taught.  Dressing was changed.       POD #2, Continued  PT for ambulation and exercise program.  IV saline locked.  O2 discontinued.    The remainder of the hospital course was dedicated to ambulation and strengthening.   The patient was discharged on 1 Day Post-Op in  Good condition.  Blood products given:none  DIAGNOSTIC STUDIES: Recent vital signs: Patient Vitals for the past 24 hrs:  BP Temp Temp src Pulse Resp SpO2 Height Weight  04/18/17  0451 131/67 (!) 97.5 F (36.4 C) Oral 60 18 99 % - -  04/17/17 2245 123/65 97.6 F (36.4 C) Oral 73 18 97 % - -  04/17/17 1431 - - - - - 100 % - -  04/17/17 1409 (!) 153/77 97.6 F (36.4 C) Oral 67 18 100 % - -  04/17/17 1353 - (!) 97 F (36.1 C) - (!) 58 10 100 % - -  04/17/17 1337 - - - - 18 - - -  04/17/17 1335 (!) 148/85 - - (!) 57 19 100 % - -  04/17/17 1330 - - - 60 11 100 % - -  04/17/17 1320 132/81 - - (!) 59 12 100 % - -  04/17/17 1305 (!) 142/81 - - (!) 59 17 94 % - -  04/17/17 1300 - - - (!) 58 18 99 % - -  04/17/17 1258 - - - (!) 59 (!) 9 98 % - -  04/17/17 1250 139/81 - - 60 11 98 % - -  04/17/17 1245 - - - 67 15 100 % - -  04/17/17 1235 (!) 151/75 - - 63 17 98 % - -  04/17/17 1234 - - - - 17 - - -  04/17/17 1232 - (!) 97 F (36.1 C) - 62 - 96 % - -  04/17/17 0920 128/62 - - (!) 47 11 99 % - -  04/17/17 0915 125/78 - - (!) 51 (!) 7 91  % - -  04/17/17 0910 (!) 153/84 - - (!) 59 12 96 % - -  04/17/17 0905 (!) 153/79 - - (!) 51 13 99 % - -  04/17/17 0900 (!) 163/82 - - (!) 55 (!) 8 99 % - -  04/17/17 0817 (!) 158/86 97.7 F (36.5 C) Oral (!) 51 18 97 % 5\' 10"  (1.778 m) 83.1 kg (183 lb 3.2 oz)       Recent laboratory studies:  Recent Labs  04/18/17 0506  WBC 8.1  HGB 11.3*  HCT 35.4*  PLT 140*    Recent Labs  04/18/17 0506  NA 135  K 3.9  CL 102  CO2 27  BUN 15  CREATININE 0.79  GLUCOSE 88  CALCIUM 8.6*   No results found for: INR, PROTIME   Recent Radiographic Studies :  No results found.  DISCHARGE INSTRUCTIONS: Discharge Instructions    Call MD / Call 911    Complete by:  As directed    If you experience chest pain or shortness of breath, CALL 911 and be transported to the hospital emergency room.  If you develope a fever above 101 F, pus (white drainage) or increased drainage or redness at the wound, or calf pain, call your surgeon's office.   Constipation Prevention    Complete by:  As directed    Drink plenty of fluids.  Prune juice may be helpful.  You may use a stool softener, such as Colace (over the counter) 100 mg twice a day.  Use MiraLax (over the counter) for constipation as needed.   Diet - low sodium heart healthy    Complete by:  As directed    Discharge instructions    Complete by:  As directed    INSTRUCTIONS AFTER JOINT REPLACEMENT   Remove items at home which could result in a fall. This includes throw rugs or furniture in walking pathways ICE to the affected joint every three hours while awake for 30 minutes at a time, for at  least the first 3-5 days, and then as needed for pain and swelling.  Continue to use ice for pain and swelling. You may notice swelling that will progress down to the foot and ankle.  This is normal after surgery.  Elevate your leg when you are not up walking on it.   Continue to use the breathing machine you got in the hospital (incentive spirometer) which  will help keep your temperature down.  It is common for your temperature to cycle up and down following surgery, especially at night when you are not up moving around and exerting yourself.  The breathing machine keeps your lungs expanded and your temperature down.   DIET:  As you were doing prior to hospitalization, we recommend a well-balanced diet.  DRESSING / WOUND CARE / SHOWERING  Keep the surgical dressing until follow up.  The dressing is water proof, so you can shower without any extra covering.  IF THE DRESSING FALLS OFF or the wound gets wet inside, change the dressing with sterile gauze.  Please use good hand washing techniques before changing the dressing.  Do not use any lotions or creams on the incision until instructed by your surgeon.    ACTIVITY  Increase activity slowly as tolerated, but follow the weight bearing instructions below.   No driving for 6 weeks or until further direction given by your physician.  You cannot drive while taking narcotics.  No lifting or carrying greater than 10 lbs. until further directed by your surgeon. Avoid periods of inactivity such as sitting longer than an hour when not asleep. This helps prevent blood clots.  You may return to work once you are authorized by your doctor.     WEIGHT BEARING   Weight bearing as tolerated with assist device (walker, cane, etc) as directed, use it as long as suggested by your surgeon or therapist, typically at least 4-6 weeks.   EXERCISES  Results after joint replacement surgery are often greatly improved when you follow the exercise, range of motion and muscle strengthening exercises prescribed by your doctor. Safety measures are also important to protect the joint from further injury. Any time any of these exercises cause you to have increased pain or swelling, decrease what you are doing until you are comfortable again and then slowly increase them. If you have problems or questions, call your caregiver  or physical therapist for advice.   Rehabilitation is important following a joint replacement. After just a few days of immobilization, the muscles of the leg can become weakened and shrink (atrophy).  These exercises are designed to build up the tone and strength of the thigh and leg muscles and to improve motion. Often times heat used for twenty to thirty minutes before working out will loosen up your tissues and help with improving the range of motion but do not use heat for the first two weeks following surgery (sometimes heat can increase post-operative swelling).   These exercises can be done on a training (exercise) mat, on the floor, on a table or on a bed. Use whatever works the best and is most comfortable for you.    Use music or television while you are exercising so that the exercises are a pleasant break in your day. This will make your life better with the exercises acting as a break in your routine that you can look forward to.   Perform all exercises about fifteen times, three times per day or as directed.  You should exercise  both the operative leg and the other leg as well.   Exercises include:   Quad Sets - Tighten up the muscle on the front of the thigh (Quad) and hold for 5-10 seconds.   Straight Leg Raises - With your knee straight (if you were given a brace, keep it on), lift the leg to 60 degrees, hold for 3 seconds, and slowly lower the leg.  Perform this exercise against resistance later as your leg gets stronger.  Leg Slides: Lying on your back, slowly slide your foot toward your buttocks, bending your knee up off the floor (only go as far as is comfortable). Then slowly slide your foot back down until your leg is flat on the floor again.  Angel Wings: Lying on your back spread your legs to the side as far apart as you can without causing discomfort.  Hamstring Strength:  Lying on your back, push your heel against the floor with your leg straight by tightening up the muscles  of your buttocks.  Repeat, but this time bend your knee to a comfortable angle, and push your heel against the floor.  You may put a pillow under the heel to make it more comfortable if necessary.   A rehabilitation program following joint replacement surgery can speed recovery and prevent re-injury in the future due to weakened muscles. Contact your doctor or a physical therapist for more information on knee rehabilitation.    CONSTIPATION  Constipation is defined medically as fewer than three stools per week and severe constipation as less than one stool per week.  Even if you have a regular bowel pattern at home, your normal regimen is likely to be disrupted due to multiple reasons following surgery.  Combination of anesthesia, postoperative narcotics, change in appetite and fluid intake all can affect your bowels.   YOU MUST use at least one of the following options; they are listed in order of increasing strength to get the job done.  They are all available over the counter, and you may need to use some, POSSIBLY even all of these options:    Drink plenty of fluids (prune juice may be helpful) and high fiber foods Colace 100 mg by mouth twice a day  Senokot for constipation as directed and as needed Dulcolax (bisacodyl), take with full glass of water  Miralax (polyethylene glycol) once or twice a day as needed.  If you have tried all these things and are unable to have a bowel movement in the first 3-4 days after surgery call either your surgeon or your primary doctor.    If you experience loose stools or diarrhea, hold the medications until you stool forms back up.  If your symptoms do not get better within 1 week or if they get worse, check with your doctor.  If you experience "the worst abdominal pain ever" or develop nausea or vomiting, please contact the office immediately for further recommendations for treatment.   ITCHING:  If you experience itching with your medications, try taking  only a single pain pill, or even half a pain pill at a time.  You can also use Benadryl over the counter for itching or also to help with sleep.   TED HOSE STOCKINGS:  Use stockings on both legs until for at least 2 weeks or as directed by physician office. They may be removed at night for sleeping.  MEDICATIONS:  See your medication summary on the "After Visit Summary" that nursing will review with you.  You  may have some home medications which will be placed on hold until you complete the course of blood thinner medication.  It is important for you to complete the blood thinner medication as prescribed.  PRECAUTIONS:  If you experience chest pain or shortness of breath - call 911 immediately for transfer to the hospital emergency department.   If you develop a fever greater that 101 F, purulent drainage from wound, increased redness or drainage from wound, foul odor from the wound/dressing, or calf pain - CONTACT YOUR SURGEON.                                                   FOLLOW-UP APPOINTMENTS:  If you do not already have a post-op appointment, please call the office for an appointment to be seen by your surgeon.  Guidelines for how soon to be seen are listed in your "After Visit Summary", but are typically between 1-4 weeks after surgery.  OTHER INSTRUCTIONS:   Knee Replacement:  Do not place pillow under knee, focus on keeping the knee straight while resting. CPM instructions: 0-90 degrees, 2 hours in the morning, 2 hours in the afternoon, and 2 hours in the evening. Place foam block, curve side up under heel at all times except when in CPM or when walking.  DO NOT modify, tear, cut, or change the foam block in any way.  MAKE SURE YOU:  Understand these instructions.  Get help right away if you are not doing well or get worse.    Thank you for letting us be a part of your medical care team.  It is a privilege we respect greatly.  We hope these instructions will help you stay on track  for a fast and full recovery!   Increase activity slowly as tolerated    Complete by:  As directed       DISCHARGE MEDICATIONS:   Allergies as of 04/18/2017      Reactions   Promethazine Other (See Comments)   Cramps, shakes.   Augmentin [amoxicillin-pot Clavulanate] Other (See Comments)   UNKNOWN [SEE BELOW] Has patient had a PCN reaction causing immediate rash, facial/tongue/throat swelling, SOB or lightheadedness with hypotension: Unknown Has patient had a PCN reaction causing severe rash involving mucus membranes or skin necrosis: Uknown PATIENT HAS HAD A PCN REACTION THAT REQUIRED HOSPITALIZATION: >> UNSPECIFIED REACTION WHILE HE WAS ALREADY ADMITTED TO A HOSPITAL.  Has patient had a PCN reaction occurring within the last 10 years: No   Oxycodone Other (See Comments)   UNSPECIFIED REACTION  TOLERATES APAP WITHOUT OXYCODONE      Medication List    TAKE these medications   aspirin 325 MG EC tablet Take 1 tablet (325 mg total) by mouth 2 (two) times daily.   baclofen 20 MG tablet Commonly known as:  LIORESAL Take 20 mg by mouth 3 (three) times daily.   busPIRone 5 MG tablet Commonly known as:  BUSPAR Take 5 mg by mouth 2 (two) times daily.   celecoxib 200 MG capsule Commonly known as:  CELEBREX Take 200 mg by mouth daily.   clarithromycin 250 MG tablet Commonly known as:  BIAXIN Take 500 mg by mouth daily as needed. Take 1 hour prior to dental appointments   CLEARLAX packet Generic drug:  polyethylene glycol Take 17 g by mouth 2 (two) times daily.  diclofenac sodium 1 % Gel Commonly known as:  VOLTAREN Apply 1 application topically 4 (four) times daily as needed. Apply according to package instructions as needed for knee pain   FIBERCON 625 MG tablet Generic drug:  polycarbophil Take 625 mg by mouth 2 (two) times daily.   fluvoxaMINE 100 MG tablet Commonly known as:  LUVOX Take 100 mg by mouth daily.   GLUCOSAMINE 1500 COMPLEX PO Take 1 capsule by mouth  2 (two) times daily.   HYDROcodone-acetaminophen 7.5-325 MG tablet Commonly known as:  NORCO Take 1 tablet by mouth every 6 (six) hours as needed for moderate pain.   methocarbamol 500 MG tablet Commonly known as:  ROBAXIN Take 1-2 tablets (500-1,000 mg total) by mouth every 6 (six) hours as needed for muscle spasms.   multivitamin with minerals Tabs tablet Take 1 tablet by mouth daily.   OVER THE COUNTER MEDICATION Take 1 capsule by mouth 2 (two) times daily. Fish oil + vit D   OVER THE COUNTER MEDICATION Take 1 tablet by mouth 2 (two) times daily. Calcium citrate   PHENobarbital 64.8 MG tablet Commonly known as:  LUMINAL Take 64.8 mg by mouth 2 (two) times daily.   phenytoin 100 MG ER capsule Commonly known as:  DILANTIN Take 100 mg by mouth 2 (two) times daily.   PRESERVISION AREDS PO Take 1 tablet by mouth 2 (two) times daily.   ranitidine 300 MG tablet Commonly known as:  ZANTAC Take 300 mg by mouth 2 (two) times daily.   simvastatin 40 MG tablet Commonly known as:  ZOCOR Take 40 mg by mouth every evening. for cholesterol   SUPER B COMPLEX/C PO Take 1 tablet by mouth daily.   VITAMIN D3 PO Take 1,500 Units by mouth daily.            Durable Medical Equipment        Start     Ordered   04/17/17 1431  DME Walker rolling  Once    Question:  Patient needs a walker to treat with the following condition  Answer:  S/P right unicompartmental knee replacement   04/17/17 1430   04/17/17 1431  DME 3 n 1  Once     04/17/17 1430   04/17/17 1431  DME Bedside commode  Once    Question:  Patient needs a bedside commode to treat with the following condition  Answer:  S/P right unicompartmental knee replacement   04/17/17 1430      FOLLOW UP VISIT:    DISPOSITION: HOME VS. SNF  CONDITION:  Good   Donia Ast 04/18/2017, 7:45 AM

## 2017-04-18 NOTE — Care Management Obs Status (Signed)
MEDICARE OBSERVATION STATUS NOTIFICATION   Patient Details  Name: Samuel Olson MRN: [id number] Date of Birth: 06-28-1941   Medicare Observation Status Notification Given:  Yes    Ninfa Meeker, RN 04/18/2017, 10:55 AM

## 2017-04-18 NOTE — Care Management Note (Signed)
Case Management Note  Patient Details  Name: EZELL POKE MRN: [id number] Date of Birth: [birth year]  Subjective/Objective:  76 yr old gentleman s/p right unicompartmental arthroplasty.              Action/Plan: Case manager spoke with patient and his wife concerning discharge plan and DME needs. Wife says that he is scheduled to go to outpatient therapy beginning Friday, April 21, 2017 at [facility] in [place]. CPM and RW have been delivered to his home.    Expected Discharge Date:  04/18/17               Expected Discharge Plan:  Home/Self Care  In-House Referral:  NA  Discharge planning Services  CM Consult  Post Acute Care Choice:  Durable Medical Equipment Choice offered to:     DME Arranged:  Walker rolling, CPM DME Agency:  [employer]  HH Arranged:  NA HH Agency:  NA  Status of Service:  Completed, signed off  If discussed at [place] of Stay Meetings, dates discussed:    Additional Comments:  Ninfa Meeker, RN 04/18/2017, 10:52 AM

## 2017-04-18 NOTE — Progress Notes (Signed)
SPORTS MEDICINE AND JOINT REPLACEMENT  Lara Mulch, MD    Carlyon Shadow, PA-C [address], [place], [place]  [address]                             [phone]   PROGRESS NOTE  Subjective:  negative for Chest Pain  negative for Shortness of Breath  negative for Nausea/Vomiting   negative for Calf Pain  negative for Bowel Movement   Tolerating Diet: yes         Patient reports pain as 3 on 0-10 scale.    Objective: Vital signs in last 24 hours:   Patient Vitals for the past 24 hrs:  BP Temp Temp src Pulse Resp SpO2 Height Weight  04/18/17 0451 131/67 (!) 97.5 F (36.4 C) Oral 60 18 99 % - -  04/17/17 2245 123/65 97.6 F (36.4 C) Oral 73 18 97 % - -  04/17/17 1431 - - - - - 100 % - -  04/17/17 1409 (!) 153/77 97.6 F (36.4 C) Oral 67 18 100 % - -  04/17/17 1353 - (!) 97 F (36.1 C) - (!) 58 10 100 % - -  04/17/17 1337 - - - - 18 - - -  04/17/17 1335 (!) 148/85 - - (!) 57 19 100 % - -  04/17/17 1330 - - - 60 11 100 % - -  04/17/17 1320 132/81 - - (!) 59 12 100 % - -  04/17/17 1305 (!) 142/81 - - (!) 59 17 94 % - -  04/17/17 1300 - - - (!) 58 18 99 % - -  04/17/17 1258 - - - (!) 59 (!) 9 98 % - -  04/17/17 1250 139/81 - - 60 11 98 % - -  04/17/17 1245 - - - 67 15 100 % - -  04/17/17 1235 (!) 151/75 - - 63 17 98 % - -  04/17/17 1234 - - - - 17 - - -  04/17/17 1232 - (!) 97 F (36.1 C) - 62 - 96 % - -  04/17/17 0920 128/62 - - (!) 47 11 99 % - -  04/17/17 0915 125/78 - - (!) 51 (!) 7 91 % - -  04/17/17 0910 (!) 153/84 - - (!) 59 12 96 % - -  04/17/17 0905 (!) 153/79 - - (!) 51 13 99 % - -  04/17/17 0900 (!) 163/82 - - (!) 55 (!) 8 99 % - -  04/17/17 0817 (!) 158/86 97.7 F (36.5 C) Oral (!) 51 18 97 % 5\' 10"  (1.778 m) 83.1 kg (183 lb 3.2 oz)    @flow {1959:LAST@   Intake/Output from previous day:   07/16 0701 - 07/17 0700 In: 1580 [P.O.:120; I.V.:1300] Out: 550 [Urine:525]   Intake/Output this shift:   No intake/output data recorded.   Intake/Output       07/16 0701 - 07/17 0700 07/17 0701 - 07/18 0700   P.O. 120    I.V. (mL/kg) 1300 (15.6)    IV Piggyback 160    Total Intake(mL/kg) 1580 (19)    Urine (mL/kg/hr) 525    Blood 25    Total Output 550     Net +1030          Urine Occurrence 5 x       LABORATORY DATA:  Recent Labs  04/18/17 0506  WBC 8.1  HGB 11.3*  HCT 35.4*  PLT  140*    Recent Labs  04/18/17 0506  NA 135  K 3.9  CL 102  CO2 27  BUN 15  CREATININE 0.79  GLUCOSE 88  CALCIUM 8.6*   No results found for: INR, PROTIME  Examination:  General appearance: alert, cooperative and no distress Extremities: extremities normal, atraumatic, no cyanosis or edema  Wound Exam: clean, dry, intact   Drainage:  None: wound tissue dry  Motor Exam: Quadriceps and Hamstrings Intact  Sensory Exam: Superficial Peroneal, Deep Peroneal and Tibial normal   Assessment:    1 Day Post-Op  Procedure(s) (LRB): UNICOMPARTMENTAL KNEE (Right)  ADDITIONAL DIAGNOSIS:  Active Problems:   S/P right unicompartmental knee replacement     Plan: Physical Therapy as ordered Weight Bearing as Tolerated (WBAT)  DVT Prophylaxis:  Aspirin  DISCHARGE PLAN: Home  DISCHARGE NEEDS: HHPT   Patient doing great, ready for D/C         Donia Ast 04/18/2017, 7:42 AM

## 2017-04-18 NOTE — Progress Notes (Signed)
Physical Therapy Treatment Patient Details Name: Samuel Olson MRN: [id number] DOB: 11/30/1940 Today's Date: 04/18/2017    History of Present Illness Patient is a 76 y/o male who presents s/p Right unicompartmental knee replacement. PMH includes seizures, sleep apnea, anxiety, cervical myelopathy.    PT Comments    Patient progressing well towards PT goals. Tolerated gait training and stair training with supervision for safety. Provided handout and instructed pt in exercises. Pt with 3-105 degree knee AROM today. Education re: how to get in/out of car, exercises, stair training and safety. Plans to d/c home today with support of wife. Will follow if still in hospital.   Follow Up Recommendations  DC plan and follow up therapy as arranged by surgeon;Supervision for mobility/OOB     Equipment Recommendations  None recommended by PT    Recommendations for Other Services       Precautions / Restrictions Precautions Precautions: Knee Precaution Booklet Issued: Yes (comment) Precaution Comments: Reviewed no pillow under knee and precautions and handout Restrictions Weight Bearing Restrictions: Yes RLE Weight Bearing: Weight bearing as tolerated    Mobility  Bed Mobility Overal bed mobility: Needs Assistance Bed Mobility: Sit to Supine       Sit to supine: Modified independent (Device/Increase time)   General bed mobility comments: HOB flat, no use of rails for support.   Transfers Overall transfer level: Needs assistance Equipment used: Rolling walker (2 wheeled) Transfers: Sit to/from Stand Sit to Stand: Supervision         General transfer comment: Supervision for safety. Stood from chair x2, from EOB x1.   Ambulation/Gait Ambulation/Gait assistance: Supervision Ambulation Distance (Feet): 200 Feet Assistive device: Rolling walker (2 wheeled) Gait Pattern/deviations: Step-to pattern;Step-through pattern;Decreased stance time - right;Decreased step length -  left;Trunk flexed Gait velocity: decreased   General Gait Details: Cues for knee extension during stance phase to activate quads. 1 standing rest break. Cues for RW management esp during turns.   Stairs Stairs: Yes   Stair Management: Step to pattern;Two rails Number of Stairs: 2 General stair comments: Cues for technique and safety.   Wheelchair Mobility    Modified Rankin (Stroke Patients Only)       Balance Overall balance assessment: Needs assistance Sitting-balance support: Feet supported;No upper extremity supported Sitting balance-Leahy Scale: Good     Standing balance support: During functional activity;Single extremity supported Standing balance-Leahy Scale: Fair Standing balance comment: Able to stand at toilet and urinate with 1 UE support; requires UE support for ambulation for safety.                             Cognition Arousal/Alertness: Awake/alert Behavior During Therapy: WFL for tasks assessed/performed Overall Cognitive Status: Within Functional Limits for tasks assessed                                 General Comments: Has memory issues per wife      Exercises Total Joint Exercises Ankle Circles/Pumps: 10 reps;Seated;Both Quad Sets: Both;10 reps;Supine;Strengthening Towel Squeeze: Both;10 reps;Supine Heel Slides: Right;5 reps;Supine Hip ABduction/ADduction: Right;5 reps;Supine Knee Flexion: Right;5 reps;Seated Goniometric ROM: 3- 105 degrees knee AROM    General Comments        Pertinent Vitals/Pain Pain Assessment: 0-10 Pain Score: 7  Pain Location: right knee Pain Descriptors / Indicators: Sore;Operative site guarding;Aching Pain Intervention(s): Monitored during session;Repositioned;Limited activity within patient's tolerance;Patient requesting pain  meds-RN notified    Home Living                      Prior Function            PT Goals (current goals can now be found in the care plan section)  Progress towards PT goals: Progressing toward goals    Frequency    7X/week      PT Plan Current plan remains appropriate    Co-evaluation              AM-PAC PT "6 Clicks" Daily Activity  Outcome Measure  Difficulty turning over in bed (including adjusting bedclothes, sheets and blankets)?: None Difficulty moving from lying on back to sitting on the side of the bed? : None Difficulty sitting down on and standing up from a chair with arms (e.g., wheelchair, bedside commode, etc,.)?: None Help needed moving to and from a bed to chair (including a wheelchair)?: A Little Help needed walking in hospital room?: A Little Help needed climbing 3-5 steps with a railing? : A Little 6 Click Score: 21    End of Session Equipment Utilized During Treatment: Gait belt Activity Tolerance: Patient tolerated treatment well Patient left: in bed;with call bell/phone within reach;with nursing/sitter in room Nurse Communication: Mobility status PT Visit Diagnosis: Pain;Difficulty in walking, not elsewhere classified (R26.2) Pain - Right/Left: Right Pain - part of body: Knee     Time: 0786-7544 PT Time Calculation (min) (ACUTE ONLY): 32 min  Charges:  $Gait Training: 8-22 mins $Therapeutic Exercise: 8-22 mins                    G Codes:       Wray Kearns, PT, DPT [place] 04/18/2017, 10:30 AM

## 2017-04-19 ENCOUNTER — Encounter (HOSPITAL_COMMUNITY): Payer: Self-pay | Admitting: Orthopedic Surgery

## 2018-11-29 ENCOUNTER — Observation Stay (HOSPITAL_COMMUNITY)
Admission: EM | Admit: 2018-11-29 | Discharge: 2018-12-01 | Disposition: A | Discharge status: Home / Self Care | Payer: Medicare Other | Source: Other Acute Inpatient Hospital | Attending: Internal Medicine | Admitting: Internal Medicine

## 2018-11-29 DIAGNOSIS — F419 Anxiety disorder, unspecified: Secondary | ICD-10-CM | POA: Diagnosis not present

## 2018-11-29 DIAGNOSIS — Z880 Allergy status to penicillin: Secondary | ICD-10-CM | POA: Diagnosis not present

## 2018-11-29 DIAGNOSIS — E7800 Pure hypercholesterolemia, unspecified: Secondary | ICD-10-CM | POA: Insufficient documentation

## 2018-11-29 DIAGNOSIS — M1990 Unspecified osteoarthritis, unspecified site: Secondary | ICD-10-CM | POA: Insufficient documentation

## 2018-11-29 DIAGNOSIS — Z87891 Personal history of nicotine dependence: Secondary | ICD-10-CM | POA: Insufficient documentation

## 2018-11-29 DIAGNOSIS — G4733 Obstructive sleep apnea (adult) (pediatric): Secondary | ICD-10-CM | POA: Insufficient documentation

## 2018-11-29 DIAGNOSIS — Z7982 Long term (current) use of aspirin: Secondary | ICD-10-CM | POA: Insufficient documentation

## 2018-11-29 DIAGNOSIS — Q453 Other congenital malformations of pancreas and pancreatic duct: Secondary | ICD-10-CM | POA: Insufficient documentation

## 2018-11-29 DIAGNOSIS — Z9981 Dependence on supplemental oxygen: Secondary | ICD-10-CM | POA: Diagnosis not present

## 2018-11-29 DIAGNOSIS — R7989 Other specified abnormal findings of blood chemistry: Secondary | ICD-10-CM | POA: Insufficient documentation

## 2018-11-29 DIAGNOSIS — M419 Scoliosis, unspecified: Secondary | ICD-10-CM | POA: Diagnosis not present

## 2018-11-29 DIAGNOSIS — K831 Obstruction of bile duct: Secondary | ICD-10-CM | POA: Diagnosis present

## 2018-11-29 DIAGNOSIS — K219 Gastro-esophageal reflux disease without esophagitis: Secondary | ICD-10-CM | POA: Diagnosis not present

## 2018-11-29 DIAGNOSIS — R569 Unspecified convulsions: Secondary | ICD-10-CM | POA: Insufficient documentation

## 2018-11-29 DIAGNOSIS — Z79899 Other long term (current) drug therapy: Secondary | ICD-10-CM | POA: Diagnosis not present

## 2018-11-29 DIAGNOSIS — K8051 Calculus of bile duct without cholangitis or cholecystitis with obstruction: Secondary | ICD-10-CM | POA: Diagnosis present

## 2018-11-29 DIAGNOSIS — K589 Irritable bowel syndrome without diarrhea: Secondary | ICD-10-CM | POA: Insufficient documentation

## 2018-11-29 DIAGNOSIS — Z9049 Acquired absence of other specified parts of digestive tract: Secondary | ICD-10-CM | POA: Diagnosis not present

## 2018-11-29 DIAGNOSIS — K449 Diaphragmatic hernia without obstruction or gangrene: Secondary | ICD-10-CM | POA: Insufficient documentation

## 2018-11-29 DIAGNOSIS — Z791 Long term (current) use of non-steroidal anti-inflammatories (NSAID): Secondary | ICD-10-CM | POA: Insufficient documentation

## 2018-11-29 DIAGNOSIS — Z885 Allergy status to narcotic agent status: Secondary | ICD-10-CM | POA: Diagnosis not present

## 2018-11-29 DIAGNOSIS — R945 Abnormal results of liver function studies: Secondary | ICD-10-CM

## 2018-11-29 DIAGNOSIS — K8050 Calculus of bile duct without cholangitis or cholecystitis without obstruction: Secondary | ICD-10-CM | POA: Diagnosis present

## 2018-11-29 DIAGNOSIS — I82401 Acute embolism and thrombosis of unspecified deep veins of right lower extremity: Secondary | ICD-10-CM | POA: Diagnosis present

## 2018-11-29 DIAGNOSIS — D3501 Benign neoplasm of right adrenal gland: Secondary | ICD-10-CM | POA: Insufficient documentation

## 2018-11-29 NOTE — Progress Notes (Signed)
Pt arrived to [facility]. Identified appropriately. Admission notified of pt arrival to floor. No orders on file. Pt alert and oriented x 4, VS stable, denied chest pain and SOB, denied abdominal pain at this time, no signs of acute distress.  Pt placed on cardiac monitor and CCMD notified.  Pt oriented to room and equipment, instructed to call for assistance prior to getting out of bed. Call bell left within reach. Pt verbalized understanding.  Will continue to monitor and treat pt per MD orders.

## 2018-11-30 ENCOUNTER — Observation Stay (HOSPITAL_COMMUNITY): Payer: Medicare Other

## 2018-11-30 ENCOUNTER — Encounter (HOSPITAL_COMMUNITY)
Admission: EM | Disposition: A | Discharge status: Home / Self Care | Payer: Self-pay | Source: Other Acute Inpatient Hospital | Attending: Internal Medicine

## 2018-11-30 ENCOUNTER — Observation Stay (HOSPITAL_BASED_OUTPATIENT_CLINIC_OR_DEPARTMENT_OTHER): Payer: Medicare Other

## 2018-11-30 ENCOUNTER — Encounter (HOSPITAL_COMMUNITY): Payer: Self-pay | Admitting: Family Medicine

## 2018-11-30 DIAGNOSIS — K8050 Calculus of bile duct without cholangitis or cholecystitis without obstruction: Secondary | ICD-10-CM | POA: Diagnosis present

## 2018-11-30 DIAGNOSIS — M79609 Pain in unspecified limb: Secondary | ICD-10-CM | POA: Diagnosis not present

## 2018-11-30 DIAGNOSIS — R945 Abnormal results of liver function studies: Secondary | ICD-10-CM | POA: Diagnosis not present

## 2018-11-30 DIAGNOSIS — I824Y1 Acute embolism and thrombosis of unspecified deep veins of right proximal lower extremity: Secondary | ICD-10-CM | POA: Diagnosis not present

## 2018-11-30 DIAGNOSIS — R569 Unspecified convulsions: Secondary | ICD-10-CM | POA: Diagnosis not present

## 2018-11-30 DIAGNOSIS — K8051 Calculus of bile duct without cholangitis or cholecystitis with obstruction: Secondary | ICD-10-CM | POA: Diagnosis not present

## 2018-11-30 DIAGNOSIS — K831 Obstruction of bile duct: Secondary | ICD-10-CM | POA: Diagnosis present

## 2018-11-30 DIAGNOSIS — I82401 Acute embolism and thrombosis of unspecified deep veins of right lower extremity: Secondary | ICD-10-CM | POA: Diagnosis present

## 2018-11-30 LAB — COMPREHENSIVE METABOLIC PANEL
ALBUMIN: 3.8 g/dL (ref 3.5–5.0)
ALT: 267 U/L — ABNORMAL HIGH (ref 0–44)
ANION GAP: 8 (ref 5–15)
AST: 224 U/L — ABNORMAL HIGH (ref 15–41)
Alkaline Phosphatase: 108 U/L (ref 38–126)
BUN: 11 mg/dL (ref 8–23)
CO2: 28 mmol/L (ref 22–32)
Calcium: 9.3 mg/dL (ref 8.9–10.3)
Chloride: 101 mmol/L (ref 98–111)
Creatinine, Ser: 0.85 mg/dL (ref 0.61–1.24)
GFR calc Af Amer: >60 mL/min (ref >60)
GFR calc non Af Amer: >60 mL/min (ref >60)
Glucose, Bld: 88 mg/dL (ref 70–99)
Potassium: 3.5 mmol/L (ref 3.5–5.1)
Sodium: 137 mmol/L (ref 135–145)
Total Bilirubin: 0.7 mg/dL (ref 0.3–1.2)
Total Protein: 6.4 g/dL — ABNORMAL LOW (ref 6.5–8.1)

## 2018-11-30 LAB — CBC WITH DIFFERENTIAL/PLATELET
Abs Immature Granulocytes: 0.01 10*3/uL (ref 0.00–0.07)
Basophils Absolute: 0 10*3/uL (ref 0.0–0.1)
Basophils Relative: 0 %
Eosinophils Absolute: 0.1 10*3/uL (ref 0.0–0.5)
Eosinophils Relative: 2 %
HEMATOCRIT: 41.8 % (ref 39.0–52.0)
Hemoglobin: 13.8 g/dL (ref 13.0–17.0)
Immature Granulocytes: 0 %
Lymphocytes Relative: 22 %
Lymphs Abs: 1.2 10*3/uL (ref 0.7–4.0)
MCH: 32.1 pg (ref 26.0–34.0)
MCHC: 33 g/dL (ref 30.0–36.0)
MCV: 97.2 fL (ref 80.0–100.0)
Monocytes Absolute: 0.4 10*3/uL (ref 0.1–1.0)
Monocytes Relative: 7 %
NEUTROS PCT: 69 %
Neutro Abs: 3.8 10*3/uL (ref 1.7–7.7)
Platelets: 142 10*3/uL — ABNORMAL LOW (ref 150–400)
RBC: 4.3 MIL/uL (ref 4.22–5.81)
RDW: 11.9 % (ref 11.5–15.5)
WBC: 5.5 10*3/uL (ref 4.0–10.5)
nRBC: 0 % (ref 0.0–0.2)

## 2018-11-30 LAB — PROTIME-INR
INR: 1 (ref 0.8–1.2)
Prothrombin Time: 13.5 seconds (ref 11.4–15.2)

## 2018-11-30 LAB — LIPASE, BLOOD: LIPASE: 35 U/L (ref 11–51)

## 2018-11-30 SURGERY — ERCP, WITH INTERVENTION IF INDICATED
Anesthesia: General

## 2018-11-30 MED ORDER — SODIUM CHLORIDE 0.9 % IV SOLN
INTRAVENOUS | Status: AC
  Administered 2018-11-30: 01:00:00 via INTRAVENOUS

## 2018-11-30 MED ORDER — ACETAMINOPHEN 325 MG PO TABS: 650.0000 mg | ORAL_TABLET | Freq: Four times a day (QID) | ORAL | Status: DC | PRN

## 2018-11-30 MED ORDER — GADOBUTROL 1 MMOL/ML IV SOLN
8.0000 mL | Freq: Once | INTRAVENOUS | Status: AC | PRN
  Administered 2018-11-30: 8 mL via INTRAVENOUS

## 2018-11-30 MED ORDER — FENTANYL CITRATE (PF) 100 MCG/2ML IJ SOLN
25.0000 ug | INTRAMUSCULAR | Status: DC | PRN
  Administered 2018-11-30: 25 ug via INTRAVENOUS
  Administered 2018-11-30: 50 ug via INTRAVENOUS
  Administered 2018-11-30 – 2018-12-01 (×2): 25 ug via INTRAVENOUS
  Filled 2018-11-30 (×4): qty 2

## 2018-11-30 MED ORDER — ENOXAPARIN SODIUM 40 MG/0.4ML ~~LOC~~ SOLN
40.0000 mg | SUBCUTANEOUS | Status: DC
  Administered 2018-11-30: 40 mg via SUBCUTANEOUS
  Filled 2018-11-30: qty 0.4

## 2018-11-30 MED ORDER — ONDANSETRON HCL 4 MG/2ML IJ SOLN: 4.0000 mg | Freq: Four times a day (QID) | INTRAMUSCULAR | Status: DC | PRN

## 2018-11-30 MED ORDER — HYDRALAZINE HCL 20 MG/ML IJ SOLN: 10.0000 mg | INTRAMUSCULAR | Status: DC | PRN

## 2018-11-30 MED ORDER — ACETAMINOPHEN 650 MG RE SUPP: 650.0000 mg | Freq: Four times a day (QID) | RECTAL | Status: DC | PRN

## 2018-11-30 MED ORDER — ONDANSETRON HCL 4 MG PO TABS: 4.0000 mg | ORAL_TABLET | Freq: Four times a day (QID) | ORAL | Status: DC | PRN

## 2018-11-30 MED ORDER — HEPARIN (PORCINE) 25000 UT/250ML-% IV SOLN
1100.0000 [IU]/h | INTRAVENOUS | Status: DC
  Administered 2018-11-30: 1100 [IU]/h via INTRAVENOUS
  Filled 2018-11-30: qty 250

## 2018-11-30 NOTE — H&P (View-Only) (Signed)
[facility] Consult: 10:24 AM 11/30/2018  LOS: 0 days    Referring Provider: Dr Starla Link  Primary Care Physician:  Elyn Aquas Primary Gastroenterologist:  [place].  Unassigned GI patient transferred from [facility].      Reason for Consultation:  Lesion/polyp in bile duct.     HPI: Samuel Olson is a 78 y.o. male.  PMH IBS.  Remote seizures.  GERD.  Cataracts.  Scoliosis.  Hypercholesterolemia.  Osteoarthritis.  Cataracts.  OSA on CPAP.   Memory problems.  Biliary pancreatitis in 1994 Surgeries include cholecystectomy 1994, tonsillectomy oral surgery, cardiac cath, bilateral partial knee replacement Previous colonoscopies and endoscopies in [place]. Constipation.  Evaluated 2009 at [facility] by Dr. Cristy Folks.  Constipation resolved after starting on daily MiraLAX.  Home meds include but not limited to ranitidine 300 mg twice daily, MiraLAX.  On Wednesday evening he developed acute upper abdominal pain.  He had eaten some nuts a little while prior to this.  The pain persisted and his wife brought him to the [facility] emergency department at about 11:00 that night.  He underwent radiologic and lab testing CT abdomen pelvis with contrast showed retained stone in proximal CBD with dilated biliary tree.  Also noted was DVT in the right lower extremity with probable extension into the right common iliac or IVC.  T bili 0.5.  Alkaline phosphatase 143.  AST/ALT 196/100.  Lipase 79. WBC is normal.  Hgb 14.6.  Platelets 145. This morning AST/ALT 224/267 with normal bili and alk phos. MRCP/MRI reveal 1.3 cm mass versus polyp in the common hepatic duct.  Though CHD dilated at 1.8 cm, the lesion does not appear to be causing obstruction.Marland Kitchen  CBD 1.1 cm and terminates bluntly at the ampulla.  Partial  pancreas disease and.  Right adrenal adenoma.  Due to lack of GI coverage capable of performing ERCP at [facility],  he was transferred overnight to [facility].    Fm Hx heart disease with MI in both of his parents. Patient does not drink alcohol he is a former smoker.   Past Medical History:  Diagnosis Date  . Anxiety   . Arthritis   . Cervical myelopathy ([place])   . Complication of anesthesia   . GERD (gastroesophageal reflux disease)   . IBS (irritable bowel syndrome)   . On home oxygen therapy    "2L when I sleep" (04/17/2017)  . Pneumonia   . PONV (postoperative nausea and vomiting)   . Scoliosis   . Seizures ([place])   . Sleep apnea    Uses O2 at 2L while sleeping; "can't tolerate CPAP OR BIPAP" * (04/17/2017)    Past Surgical History:  Procedure Laterality Date  . BACK SURGERY    . CARDIAC CATHETERIZATION    . CHOLECYSTECTOMY    . COLONOSCOPY    . FRACTURE SURGERY     Left plates and screws  . pain stimulator   2012  . pain stimulator   2012   removed 2012  . PARTIAL KNEE ARTHROPLASTY Right 04/17/2017  Procedure: UNICOMPARTMENTAL KNEE;  Surgeon: Vickey Huger, MD;  Location: [facility];  Service: Orthopedics;  Laterality: Right;  . REPLACEMENT UNICONDYLAR JOINT KNEE Right 04/17/2017  . TONSILLECTOMY    . UVULOPALATOPHARYNGOPLASTY (UPPP)/TONSILLECTOMY/SEPTOPLASTY    . VASECTOMY      Prior to Admission medications   Medication Sig Start Date End Date Taking? Authorizing Provider  aspirin EC 325 MG EC tablet Take 1 tablet (325 mg total) by mouth 2 (two) times daily. 04/18/17   Donia Ast, PA  baclofen (LIORESAL) 20 MG tablet Take 20 mg by mouth 3 (three) times daily.    [provider]  busPIRone (BUSPAR) 5 MG tablet Take 5 mg by mouth 2 (two) times daily. 03/17/17   [provider]  celecoxib (CELEBREX) 200 MG capsule Take 200 mg by mouth daily. 03/20/17   [provider]  Cholecalciferol (VITAMIN D3 PO) Take 1,500 Units by mouth  daily.    [provider]  clarithromycin (BIAXIN) 250 MG tablet Take 500 mg by mouth daily as needed. Take 1 hour prior to dental appointments    [provider]  diclofenac sodium (VOLTAREN) 1 % GEL Apply 1 application topically 4 (four) times daily as needed. Apply according to package instructions as needed for knee pain 01/27/17   [provider]  fluvoxaMINE (LUVOX) 100 MG tablet Take 100 mg by mouth daily. 01/25/17   [provider]  Glucosamine-Chondroit-Vit C-Mn (GLUCOSAMINE 1500 COMPLEX PO) Take 1 capsule by mouth 2 (two) times daily.    [provider]  HYDROcodone-acetaminophen (NORCO) 7.5-325 MG tablet Take 1 tablet by mouth every 6 (six) hours as needed for moderate pain. 04/18/17   Donia Ast, PA  methocarbamol (ROBAXIN) 500 MG tablet Take 1-2 tablets (500-1,000 mg total) by mouth every 6 (six) hours as needed for muscle spasms. 04/18/17   Donia Ast, PA  Multiple Vitamin (MULTIVITAMIN WITH MINERALS) TABS tablet Take 1 tablet by mouth daily.    [provider]  Multiple Vitamins-Minerals (PRESERVISION AREDS PO) Take 1 tablet by mouth 2 (two) times daily.    [provider]  OVER THE COUNTER MEDICATION Take 1 capsule by mouth 2 (two) times daily. Fish oil + vit D    [provider]  OVER THE COUNTER MEDICATION Take 1 tablet by mouth 2 (two) times daily. Calcium citrate    [provider]  PHENobarbital (LUMINAL) 64.8 MG tablet Take 64.8 mg by mouth 2 (two) times daily. 02/27/17   [provider]  phenytoin (DILANTIN) 100 MG ER capsule Take 100 mg by mouth 2 (two) times daily. 03/20/17   [provider]  polycarbophil (FIBERCON) 625 MG tablet Take 625 mg by mouth 2 (two) times daily.    [provider]  polyethylene glycol (CLEARLAX) packet Take 17 g by mouth 2 (two) times daily.    [provider]  ranitidine (ZANTAC) 300 MG tablet Take 300 mg by mouth 2 (two)  times daily. 01/24/17   [provider]  simvastatin (ZOCOR) 40 MG tablet Take 40 mg by mouth every evening. for cholesterol 02/25/17   [provider]  SUPER B COMPLEX/C PO Take 1 tablet by mouth daily.    [provider]    Scheduled Meds:  Infusions: . sodium chloride 80 mL/hr at 11/30/18 0034  . heparin 1,100 Units/hr (11/30/18 0441)   PRN Meds: acetaminophen **OR** acetaminophen, fentaNYL (SUBLIMAZE) injection, hydrALAZINE, ondansetron **OR** ondansetron (ZOFRAN) IV   Allergies as of 11/29/2018 - Review Complete  04/17/2017  Allergen Reaction Noted  . Promethazine Other (See Comments) 01/26/2017  . Augmentin [amoxicillin-pot clavulanate] Other (See Comments) 04/07/2017  . Oxycodone Other (See Comments) 04/14/2017    History reviewed. No pertinent family history.  Social History   Socioeconomic History  . Marital status: Married    Spouse name: Not on file  . Number of children: Not on file  . Years of education: Not on file  . Highest education level: Not on file  Occupational History  . Not on file  Social Needs  . Financial resource strain: Not on file  . Food insecurity:    Worry: Not on file    Inability: Not on file  . Transportation needs:    Medical: Not on file    Non-medical: Not on file  Tobacco Use  . Smoking status: Former Smoker    Types: Cigarettes  . Smokeless tobacco: Never Used  Substance and Sexual Activity  . Alcohol use: No  . Drug use: No  . Sexual activity: Not on file  Lifestyle  . Physical activity:    Days per week: Not on file    Minutes per session: Not on file  . Stress: Not on file  Relationships  . Social connections:    Talks on phone: Not on file    Gets together: Not on file    Attends religious service: Not on file    Active member of club or organization: Not on file    Attends meetings of clubs or organizations: Not on file    Relationship status: Not on file  . Intimate partner violence:      Fear of current or ex partner: Not on file    Emotionally abused: Not on file    Physically abused: Not on file    Forced sexual activity: Not on file  Other Topics Concern  . Not on file  Social History Narrative  . Not on file    REVIEW OF SYSTEMS: Constitutional: Feels washed out.  Today he feels like he is burning all over but has not had sweats. ENT:  No nose bleeds Pulm: Denies difficulty breathing or cough. CV:  No palpitations, no LE edema.  GU:  No hematuria, no frequency.  Low, not amber or dark-colored.  with IV fluids he is urinating quite a bit GI: Per HPI. Heme: Denies unusual bleeding or bruising. Transfusions: None Neuro:  No headaches, no peripheral tingling or numbness Derm:  No itching, no rash or sores.  Endocrine:  No sweats or chills.  No polyuria or dysuria Immunization: Inquire as to recent immunizations.    PHYSICAL EXAM: Vital signs in last 24 hours: Vitals:   11/29/18 2248 11/30/18 0504  BP: (!) 159/99 (!) 149/89  Pulse: 72 (!) 101  Resp: 16 18  Temp: 98 F (36.7 C) 97.9 F (36.6 C)  SpO2: 100% 100%   Wt Readings from Last 3 Encounters:  11/30/18 80.2 kg  04/17/17 83.1 kg  04/10/17 83.1 kg    General: Looks tired.  Not toxic, not ill appearing. Head: No facial asymmetry or swelling.  No signs of head trauma. Eyes: No scleral icterus, no conjunctival pallor.  EOMI. Ears: Not hard of hearing. Nose: Discharge. Mouth: Oropharynx moist, pink, clear.  Tongue midline. Neck: No JVD, no masses, no thyromegaly. Lungs: Minutes breath sounds on the right.   No cough or labored breathing. Heart: RRR. Abdomen: Soft.  Not tender or distended.  No masses, no HSM, no bruits, no hernias.  Bowel sounds active..   Rectal: Deferred Musc/Skeltl: Postoperative changes in the knees.  No redness or swelling in the joints. Extremities: CCE. Neurologic: Oriented x3.  Alert.  No tremors.  No gross deficits. Skin: No jaundice.  No telangiectasia, no rash,  no sores. Tattoos: None seen. Nodes: No cervical adenopathy. Psych: Pleasant, cooperative, slightly dull affect consistent with his not feeling well..  Intake/Output from previous day: 02/27 0701 - 02/28 0700 In: 446.1 [I.V.:446.1] Out: 550 [Urine:550] Intake/Output this shift: Total I/O In: -  Out: 200 [Urine:200]  LAB RESULTS: Recent Labs    11/30/18 0022  WBC 5.5  HGB 13.8  HCT 41.8  PLT 142*   BMET Lab Results  Component Value Date   NA 137 11/30/2018   NA 135 04/18/2017   NA 138 04/10/2017   K 3.5 11/30/2018   K 3.9 04/18/2017   K 4.8 04/10/2017   CL 101 11/30/2018   CL 102 04/18/2017   CL 102 04/10/2017   CO2 28 11/30/2018   CO2 27 04/18/2017   CO2 28 04/10/2017   GLUCOSE 88 11/30/2018   GLUCOSE 88 04/18/2017   GLUCOSE 86 04/10/2017   BUN 11 11/30/2018   BUN 15 04/18/2017   BUN 21 (H) 04/10/2017   CREATININE 0.85 11/30/2018   CREATININE 0.79 04/18/2017   CREATININE 0.81 04/10/2017   CALCIUM 9.3 11/30/2018   CALCIUM 8.6 (L) 04/18/2017   CALCIUM 9.4 04/10/2017   LFT Recent Labs    11/30/18 0022  PROT 6.4*  ALBUMIN 3.8  AST 224*  ALT 267*  ALKPHOS 108  BILITOT 0.7   PT/INR Lab Results  Component Value Date   INR 1.0 11/30/2018   Hepatitis Panel No results for input(s): HEPBSAG, HCVAB, HEPAIGM, HEPBIGM in the last 72 hours. C-Diff No components found for: CDIFF Lipase     Component Value Date/Time   LIPASE 35 11/30/2018 0022    Drugs of Abuse  No results found for: LABOPIA, COCAINSCRNUR, LABBENZ, AMPHETMU, THCU, LABBARB   RADIOLOGY STUDIES: Mr 3d Recon At Scanner Mr Abdomen Mrcp Moise Boring Contast  Result Date: 11/30/2018 CLINICAL DATA:  Abnormal liver function tests with acute epigastric pain. Cholecystectomy about 10 years ago. Nausea. EXAM: MRI ABDOMEN WITHOUT AND WITH CONTRAST (INCLUDING MRCP) TECHNIQUE: Multiplanar multisequence MR imaging of the abdomen was performed both before and after the administration of intravenous contrast.  Heavily T2-weighted images of the biliary and pancreatic ducts were obtained, and three-dimensional MRCP images were rendered by post processing. CONTRAST:  8 cc Gadavist COMPARISON:  CT abdomen 11/29/2018 FINDINGS: Lower chest: Small hiatal hernia. Dextroconvex lower thoracic scoliosis. 6 mm right lower lobe nodule on image 1/15, not changed from 11/29/2018. Hepatobiliary: Cholecystectomy. There is 1.3 by 1.0 by 1.3 cm enhancing mass anteriorly in the common hepatic duct, for example as shown on subtraction image 31/18. This lesion has some accentuated precontrast T1 signal specially along its medial border on image 33/13, but does appear to diffusely enhance on subtraction images. No separate stone is identified at this lesion corresponds to the filling defect seen on the 11/29/2018 CT scan The common hepatic duct remains dilated distal to this lesion, at 1.8 cm in diameter, and the common bile duct is also mildly dilated at 1.1 cm in diameter. The distal CBD demonstrates a somewhat blunted termination in the ampulla, but without another filling defect in this area. No additional liver lesions are identified. There is minimal intrahepatic biliary dilatation. Pancreas: Dilated dorsal pancreatic duct. Partial pancreas divisum. No pancreatic mass  is identified. Spleen:  Unremarkable Adrenals/Urinary Tract: 3.0 by 1.9 cm right adrenal adenoma, with prominent dropout of signal in the lesion on out of phase images. Left adrenal gland and kidneys appear normal. Stomach/Bowel: Prominent stool throughout the colon favors constipation. Vascular/Lymphatic: Aortoiliac atherosclerotic vascular disease. No appreciable pathologic adenopathy. Other:  No supplemental non-categorized findings. Musculoskeletal: Posterolateral rod and pedicle screw fixation at L4-L5-S1. Likely chronic compression at the L2 level. Levoconvex lumbar scoliosis. IMPRESSION: 1. A 1.3 cm enhancing mass or polyp is present anteriorly in the common hepatic  duct. This has some accentuated precontrast T1 signal but clearly enhances. Possibilities include cholangiocarcinoma, and various types of polypoid lesions such as inflammatory polyp, cholesterol polyp, papilloma, or cystadenoma. Surgical referral recommended. 2. Biliary dilatation with the common hepatic duct at 1.8 cm and the common bile duct at 1.1 cm. Much of this may be due to physiologic response to cholecystectomy. The polypoid mass in the common hepatic duct does not appear obstructive, and no other mass or stone is seen in the biliary tree. The CBD terminates in a slightly blunt fashion in the region of the ampulla, but without obvious filling defect or ampullary mass. 3. Partial pancreas divisum. 4. Right adrenal adenoma. 5. Other imaging findings of potential clinical significance: Aortic Atherosclerosis (ICD10-I70.0). Prominent stool throughout the colon favors constipation. Chronic compression of the L2 vertebral body. Levoconvex lumbar scoliosis and dextroconvex lower thoracic scoliosis. Small hiatal hernia. Electronically Signed   By: Van Clines M.D.   On: 11/30/2018 10:00      IMPRESSION:   *  CHD lesion.  ? Polyp, ? Cholangiocarcinoma.   Prior cholecystectomy 1994 with associated biliary pancreatitis at that time.  *    IBS-C.  Constipation well managed with MiraLAX.  *     RLE DVT.  On IV Heparin gtt.   patient is undergo a lower extremity vascular ultrasound.  ?  Need for CT to rule out PE?  The papers from [place] do not include any chest CT.     PLAN:     *    ERCP. Dr. Jerilynn Mages was trying to get this arranged for today.  However the heparin is in place so we may need to delay ERCP when patient can have a window off heparin.   Azucena Freed  11/30/2018, 10:24 AM Phone [phone]

## 2018-11-30 NOTE — Consult Note (Signed)
[facility] Surgery Consult/Admission Note  TERELL KINCY 1940-12-31  [id number].    Requesting MD: Dr. Starla Link Chief Complaint/Reason for Consult: Lesion in hepatic duct  HPI:  Pt is a 78 yo male with a hx of GERD, IBS, hypercholesterolemia, OA, OSA on CPAP, s/p cholecystectomy 9735 complicated by pancreatitis, and hx of cardiac cath who was transferred from [facility] for further evaluation of CT findings of CBD dilation with question of retained stone within the CBD. Pt states he developed acute epigastric abdominal pain 2 days ago after eating peanuts. Pain progressively worsened to constant, severe, non radiating with associated nausea no vomiting. He states he has never had this type of pain before. He has not eaten in 2 days. His pain has improved and he is currently pain free. He complains of thirst and hunger. His bowel movements have been normal for him and no urinary complaints. [facility] and it showed 1.3 cm enhancing mass or polyp is present anteriorly in the common hepatic duct. We were asked to see.   [facility] ED Course: afebrile, Chemistry panel with an alkaline phosphatase of 143, AST 197, ALT 100, and normal bilirubin.  Lipase was elevated to 79.  CBC was unremarkable.    ROS:  Review of Systems  Constitutional: Negative for chills, diaphoresis and fever.  HENT: Negative for sore throat.   Respiratory: Negative for cough and shortness of breath.   Cardiovascular: Negative for chest pain.  Gastrointestinal: Positive for abdominal pain and nausea. Negative for blood in stool, constipation, diarrhea and vomiting.  Genitourinary: Negative for dysuria.  Skin: Negative for rash.  Neurological: Negative for dizziness and loss of consciousness.  All other systems reviewed and are negative.    History reviewed. No pertinent family history.  Past Medical History:  Diagnosis Date  . Anxiety   . Arthritis   . 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  Complication of anesthesia   . GERD (gastroesophageal reflux disease)   . IBS (irritable bowel syndrome)   . On home oxygen therapy    "2L when I sleep" (04/17/2017)  . Pneumonia   . PONV (postoperative nausea and vomiting)   . Scoliosis   . Seizures ([place])   . Sleep apnea    Uses O2 at 2L while sleeping; "can't tolerate CPAP OR BIPAP" * (04/17/2017)    Past Surgical History:  Procedure Laterality Date  . BACK SURGERY    . CARDIAC CATHETERIZATION    . CHOLECYSTECTOMY    . COLONOSCOPY    . FRACTURE SURGERY     Left plates and screws  . pain stimulator   2012  . pain stimulator   2012   removed 2012  . PARTIAL KNEE ARTHROPLASTY Right 04/17/2017   Procedure: UNICOMPARTMENTAL KNEE;  Surgeon: Vickey Huger, MD;  Location: [place];  Service: Orthopedics;  Laterality: Right;  . REPLACEMENT UNICONDYLAR JOINT KNEE Right 04/17/2017  . TONSILLECTOMY    . UVULOPALATOPHARYNGOPLASTY (UPPP)/TONSILLECTOMY/SEPTOPLASTY    . VASECTOMY      Social History:  reports that he has quit smoking. His smoking use included cigarettes. He has never used smokeless tobacco. He reports that he does not drink alcohol or use drugs.  Allergies:  Allergies  Allergen Reactions  . Promethazine Other (See Comments)    Cramps, shakes.  . Augmentin [Amoxicillin-Pot Clavulanate] Other (See Comments)    UNKNOWN [SEE BELOW]  Has patient had a PCN reaction causing immediate rash, facial/tongue/throat swelling, SOB or lightheadedness with hypotension: Unknown  Has patient had a PCN reaction causing severe rash involving mucus membranes or skin necrosis: Uknown PATIENT HAS HAD A PCN REACTION THAT REQUIRED HOSPITALIZATION: >> UNSPECIFIED REACTION WHILE HE WAS ALREADY ADMITTED TO A HOSPITAL.  Has patient had a PCN reaction occurring within the last 10 years: No   . Oxycodone Other (See Comments)    UNSPECIFIED REACTION  TOLERATES APAP WITHOUT OXYCODONE    Medications Prior to Admission  Medication Sig Dispense Refill   . acetaminophen (TYLENOL) 500 MG tablet Take 1,000 mg by mouth every 6 (six) hours as needed for mild pain or headache.    . baclofen (LIORESAL) 20 MG tablet Take 20 mg by mouth 2 (two) times daily.     . busPIRone (BUSPAR) 5 MG tablet Take 5 mg by mouth 2 (two) times daily.  3  . celecoxib (CELEBREX) 200 MG capsule Take 200 mg by mouth daily.  12  . cetirizine (ZYRTEC) 10 MG tablet Take 10 mg by mouth daily as needed for allergies.    . Cholecalciferol (VITAMIN D3 PO) Take 1,500 Units by mouth daily.    . clarithromycin (BIAXIN) 250 MG tablet Take 500 mg by mouth daily as needed. Take 1 hour prior to dental appointments    . famotidine (PEPCID) 40 MG tablet Take 40 mg by mouth 2 (two) times daily. Taking once daily (40mg )    . fluvoxaMINE (LUVOX) 100 MG tablet Take 100 mg by mouth daily.  1  . Glucosamine-Chondroit-Vit C-Mn (GLUCOSAMINE 1500 COMPLEX PO) Take 1 capsule by mouth 2 (two) times daily.    . Multiple Vitamin (MULTIVITAMIN WITH MINERALS) TABS tablet Take 1 tablet by mouth daily.    . Multiple Vitamins-Minerals (PRESERVISION AREDS PO) Take 1 tablet by mouth 2 (two) times daily.    Marland Kitchen OVER THE COUNTER MEDICATION Take 1 capsule by mouth 2 (two) times daily. Fish oil + vit D    . OVER THE COUNTER MEDICATION Take 1 tablet by mouth 2 (two) times daily. Calcium citrate    . OXYGEN Inhale 2 L into the lungs at bedtime.    Marland Kitchen PHENobarbital (LUMINAL) 64.8 MG tablet Take 64.8 mg by mouth at bedtime.   1  . polycarbophil (FIBERCON) 625 MG tablet Take 625 mg by mouth 2 (two) times daily.    . polyethylene glycol (CLEARLAX) packet Take 17 g by mouth daily.     . simvastatin (ZOCOR) 40 MG tablet Take 40 mg by mouth every evening. for cholesterol  3  . SUPER B COMPLEX/C PO Take 1 tablet by mouth daily.    Marland Kitchen aspirin EC 325 MG EC tablet Take 1 tablet (325 mg total) by mouth 2 (two) times daily. (Patient not taking: Reported on 11/30/2018) 30 tablet 0  . diclofenac sodium (VOLTAREN) 1 % GEL Apply 1  application topically 4 (four) times daily as needed. Apply according to package instructions as needed for knee pain  5  . HYDROcodone-acetaminophen (NORCO) 7.5-325 MG tablet Take 1 tablet by mouth every 6 (six) hours as needed for moderate pain. (Patient not taking: Reported on 11/30/2018) 40 tablet 0  . methocarbamol (ROBAXIN) 500 MG tablet Take 1-2 tablets (500-1,000 mg total) by mouth every 6 (six) hours as needed for muscle spasms. (Patient not taking: Reported on 11/30/2018) 60 tablet 0  . phenytoin (DILANTIN) 100 MG ER capsule Take 100 mg by mouth 2 (two) times daily.  3  . ranitidine (ZANTAC) 300 MG tablet Take 300 mg by mouth 2 (two) times daily.  3    Blood pressure (!) 149/89, pulse (!) 101, temperature 97.9 F (36.6 C), temperature source Oral, resp. rate 18, height 5\' 10"  (1.778 m), weight 80.2 kg, SpO2 100 %.  Physical Exam Vitals signs and nursing note reviewed. Exam conducted with a chaperone present.  Constitutional:      General: He is not in acute distress.    Appearance: Normal appearance. He is not diaphoretic.  HENT:     Head: Normocephalic and atraumatic.     Nose: Nose normal.     Mouth/Throat:     Lips: Pink.     Mouth: Mucous membranes are moist.     Pharynx: Oropharynx is clear.  Eyes:     General: No scleral icterus.       Right eye: No discharge.        Left eye: No discharge.     Conjunctiva/sclera: Conjunctivae normal.     Pupils: Pupils are equal, round, and reactive to light.  Neck:     Musculoskeletal: Normal range of motion and neck supple.  Cardiovascular:     Rate and Rhythm: Normal rate and regular rhythm.     Pulses:          Radial pulses are 2+ on the right side and 2+ on the left side.       Dorsalis pedis pulses are 2+ on the right side and 2+ on the left side.     Heart sounds: Normal heart sounds. No murmur.  Pulmonary:     Effort: Pulmonary effort is normal. No respiratory distress.     Breath sounds: Normal breath sounds. No  wheezing, rhonchi or rales.  Abdominal:     General: Bowel sounds are normal. There is no distension.     Palpations: Abdomen is soft. Abdomen is not rigid. There is no hepatomegaly or splenomegaly.     Tenderness: There is no abdominal tenderness. There is no guarding.  Musculoskeletal: Normal range of motion.        General: No tenderness or deformity.  Skin:    General: Skin is warm and dry.     Findings: No rash.  Neurological:     Mental Status: He is alert and oriented to person, place, and time.  Psychiatric:        Mood and Affect: Mood normal.        Behavior: Behavior normal.     Results for orders placed or performed during the hospital encounter of 11/29/18 (from the past 48 hour(s))  Comprehensive metabolic panel     Status: Abnormal   Collection Time: 11/30/18 12:22 AM  Result Value Ref Range   Sodium 137 135 - 145 mmol/L   Potassium 3.5 3.5 - 5.1 mmol/L   Chloride 101 98 - 111 mmol/L   CO2 28 22 - 32 mmol/L   Glucose, Bld 88 70 - 99 mg/dL   BUN 11 8 - 23 mg/dL   Creatinine, Ser 0.85 0.61 - 1.24 mg/dL   Calcium 9.3 8.9 - 10.3 mg/dL   Total Protein 6.4 (L) 6.5 - 8.1 g/dL   Albumin 3.8 3.5 - 5.0 g/dL   AST 224 (H) 15 - 41 U/L   ALT 267 (H) 0 - 44 U/L   Alkaline Phosphatase 108 38 - 126 U/L   Total Bilirubin 0.7 0.3 - 1.2 mg/dL   GFR calc non Af Amer >60 >60 mL/min   GFR calc Af Amer >60 >60 mL/min   Anion gap 8 5 - 15  Comment: Performed at [facility], [place] [address], [place], [place] [address]  CBC WITH DIFFERENTIAL     Status: Abnormal   Collection Time: 11/30/18 12:22 AM  Result Value Ref Range   WBC 5.5 4.0 - 10.5 K/uL   RBC 4.30 4.22 - 5.81 MIL/uL   Hemoglobin 13.8 13.0 - 17.0 g/dL   HCT 41.8 39.0 - 52.0 %   MCV 97.2 80.0 - 100.0 fL   MCH 32.1 26.0 - 34.0 pg   MCHC 33.0 30.0 - 36.0 g/dL   RDW 11.9 11.5 - 15.5 %   Platelets 142 (L) 150 - 400 K/uL   nRBC 0.0 0.0 - 0.2 %   Neutrophils Relative % 69 %   Neutro Abs 3.8 1.7 - 7.7 K/uL    Lymphocytes Relative 22 %   Lymphs Abs 1.2 0.7 - 4.0 K/uL   Monocytes Relative 7 %   Monocytes Absolute 0.4 0.1 - 1.0 K/uL   Eosinophils Relative 2 %   Eosinophils Absolute 0.1 0.0 - 0.5 K/uL   Basophils Relative 0 %   Basophils Absolute 0.0 0.0 - 0.1 K/uL   Immature Granulocytes 0 %   Abs Immature Granulocytes 0.01 0.00 - 0.07 K/uL    Comment: Performed at [facility] [address] [place], [place] [address]  Protime-INR     Status: None   Collection Time: 11/30/18 12:22 AM  Result Value Ref Range   Prothrombin Time 13.5 11.4 - 15.2 seconds   INR 1.0 0.8 - 1.2    Comment: (NOTE) INR goal varies based on device and disease states. Performed at [facility], [place] [address], [place], [place] [address]   Lipase, blood     Status: None   Collection Time: 11/30/18 12:22 AM  Result Value Ref Range   Lipase 35 11 - 51 U/L    Comment: Performed at [facility] [address], [place], [place] [address]   Mr 3d Recon At Scanner  Result Date: 11/30/2018 CLINICAL DATA:  Abnormal liver function tests with acute epigastric pain. Cholecystectomy about 10 years ago. Nausea. EXAM: MRI ABDOMEN WITHOUT AND WITH CONTRAST (INCLUDING MRCP) TECHNIQUE: Multiplanar multisequence MR imaging of the abdomen was performed both before and after the administration of intravenous contrast. Heavily T2-weighted images of the biliary and pancreatic ducts were obtained, and three-dimensional MRCP images were rendered by post processing. CONTRAST:  8 cc Gadavist COMPARISON:  CT abdomen 11/29/2018 FINDINGS: Lower chest: Small hiatal hernia. Dextroconvex lower thoracic scoliosis. 6 mm right lower lobe nodule on image 1/15, not changed from 11/29/2018. Hepatobiliary: Cholecystectomy. There is 1.3 by 1.0 by 1.3 cm enhancing mass anteriorly in the common hepatic duct, for example as shown on subtraction image 31/18. This lesion has some accentuated precontrast T1 signal specially along its medial border  on image 33/13, but does appear to diffusely enhance on subtraction images. No separate stone is identified at this lesion corresponds to the filling defect seen on the 11/29/2018 CT scan The common hepatic duct remains dilated distal to this lesion, at 1.8 cm in diameter, and the common bile duct is also mildly dilated at 1.1 cm in diameter. The distal CBD demonstrates a somewhat blunted termination in the ampulla, but without another filling defect in this area. No additional liver lesions are identified. There is minimal intrahepatic biliary dilatation. Pancreas: Dilated dorsal pancreatic duct. Partial pancreas divisum. No pancreatic mass is identified. Spleen:  Unremarkable Adrenals/Urinary Tract: 3.0 by 1.9 cm right  adrenal adenoma, with prominent dropout of signal in the lesion on out of phase images. Left adrenal gland and kidneys appear normal. Stomach/Bowel: Prominent stool throughout the colon favors constipation. Vascular/Lymphatic: Aortoiliac atherosclerotic vascular disease. No appreciable pathologic adenopathy. Other:  No supplemental non-categorized findings. Musculoskeletal: Posterolateral rod and pedicle screw fixation at L4-L5-S1. Likely chronic compression at the L2 level. Levoconvex lumbar scoliosis. IMPRESSION: 1. A 1.3 cm enhancing mass or polyp is present anteriorly in the common hepatic duct. This has some accentuated precontrast T1 signal but clearly enhances. Possibilities include cholangiocarcinoma, and various types of polypoid lesions such as inflammatory polyp, cholesterol polyp, papilloma, or cystadenoma. Surgical referral recommended. 2. Biliary dilatation with the common hepatic duct at 1.8 cm and the common bile duct at 1.1 cm. Much of this may be due to physiologic response to cholecystectomy. The polypoid mass in the common hepatic duct does not appear obstructive, and no other mass or stone is seen in the biliary tree. The CBD terminates in a slightly blunt fashion in the region  of the ampulla, but without obvious filling defect or ampullary mass. 3. Partial pancreas divisum. 4. Right adrenal adenoma. 5. Other imaging findings of potential clinical significance: Aortic Atherosclerosis (ICD10-I70.0). Prominent stool throughout the colon favors constipation. Chronic compression of the L2 vertebral body. Levoconvex lumbar scoliosis and dextroconvex lower thoracic scoliosis. Small hiatal hernia. Electronically Signed   By: Van Clines M.D.   On: 11/30/2018 10:00   Mr Abdomen Mrcp Moise Boring Contast  Result Date: 11/30/2018 CLINICAL DATA:  Abnormal liver function tests with acute epigastric pain. Cholecystectomy about 10 years ago. Nausea. EXAM: MRI ABDOMEN WITHOUT AND WITH CONTRAST (INCLUDING MRCP) TECHNIQUE: Multiplanar multisequence MR imaging of the abdomen was performed both before and after the administration of intravenous contrast. Heavily T2-weighted images of the biliary and pancreatic ducts were obtained, and three-dimensional MRCP images were rendered by post processing. CONTRAST:  8 cc Gadavist COMPARISON:  CT abdomen 11/29/2018 FINDINGS: Lower chest: Small hiatal hernia. Dextroconvex lower thoracic scoliosis. 6 mm right lower lobe nodule on image 1/15, not changed from 11/29/2018. Hepatobiliary: Cholecystectomy. There is 1.3 by 1.0 by 1.3 cm enhancing mass anteriorly in the common hepatic duct, for example as shown on subtraction image 31/18. This lesion has some accentuated precontrast T1 signal specially along its medial border on image 33/13, but does appear to diffusely enhance on subtraction images. No separate stone is identified at this lesion corresponds to the filling defect seen on the 11/29/2018 CT scan The common hepatic duct remains dilated distal to this lesion, at 1.8 cm in diameter, and the common bile duct is also mildly dilated at 1.1 cm in diameter. The distal CBD demonstrates a somewhat blunted termination in the ampulla, but without another filling defect  in this area. No additional liver lesions are identified. There is minimal intrahepatic biliary dilatation. Pancreas: Dilated dorsal pancreatic duct. Partial pancreas divisum. No pancreatic mass is identified. Spleen:  Unremarkable Adrenals/Urinary Tract: 3.0 by 1.9 cm right adrenal adenoma, with prominent dropout of signal in the lesion on out of phase images. Left adrenal gland and kidneys appear normal. Stomach/Bowel: Prominent stool throughout the colon favors constipation. Vascular/Lymphatic: Aortoiliac atherosclerotic vascular disease. No appreciable pathologic adenopathy. Other:  No supplemental non-categorized findings. Musculoskeletal: Posterolateral rod and pedicle screw fixation at L4-L5-S1. Likely chronic compression at the L2 level. Levoconvex lumbar scoliosis. IMPRESSION: 1. A 1.3 cm enhancing mass or polyp is present anteriorly in the common hepatic duct. This has some accentuated precontrast T1 signal but  clearly enhances. Possibilities include cholangiocarcinoma, and various types of polypoid lesions such as inflammatory polyp, cholesterol polyp, papilloma, or cystadenoma. Surgical referral recommended. 2. Biliary dilatation with the common hepatic duct at 1.8 cm and the common bile duct at 1.1 cm. Much of this may be due to physiologic response to cholecystectomy. The polypoid mass in the common hepatic duct does not appear obstructive, and no other mass or stone is seen in the biliary tree. The CBD terminates in a slightly blunt fashion in the region of the ampulla, but without obvious filling defect or ampullary mass. 3. Partial pancreas divisum. 4. Right adrenal adenoma. 5. Other imaging findings of potential clinical significance: Aortic Atherosclerosis (ICD10-I70.0). Prominent stool throughout the colon favors constipation. Chronic compression of the L2 vertebral body. Levoconvex lumbar scoliosis and dextroconvex lower thoracic scoliosis. Small hiatal hernia. Electronically Signed   By: Van Clines M.D.   On: 11/30/2018 10:00      Assessment/Plan Principal Problem:   Choledocholithiasis Active Problems:   Seizures (HCC)   Anxiety   Elevated LFTs   Biliary obstruction   Deep vein thrombosis (DVT) of right lower extremity (HCC)  Epigastric abdominal pain Mass in common hepatic duct - concerning for poylp, cholangiocarcinoma? - GI is trying to schedule an ERCP today  - ideally they will be able to get brushings or a biopsy of this lesion - we will follow along and determine plan once we have more information on this lesion  Thank you for the consult.   Kalman Drape, [facility] Surgery 11/30/2018, 11:28 AM Pager: [phone] Consults: [phone] Mon-Fri 7:00 am-4:30 pm Sat-Sun 7:00 am-11:30 am

## 2018-11-30 NOTE — Progress Notes (Signed)
ANTICOAGULATION CONSULT NOTE - Initial Consult  Pharmacy Consult for Heparin  Indication: DVT Px  Allergies  Allergen Reactions  . Promethazine Other (See Comments)    Cramps, shakes.  . Augmentin [Amoxicillin-Pot Clavulanate] Other (See Comments)    UNKNOWN [SEE BELOW]  Has patient had a PCN reaction causing immediate rash, facial/tongue/throat swelling, SOB or lightheadedness with hypotension: Unknown Has patient had a PCN reaction causing severe rash involving mucus membranes or skin necrosis: Uknown PATIENT HAS HAD A PCN REACTION THAT REQUIRED HOSPITALIZATION: >> UNSPECIFIED REACTION WHILE HE WAS ALREADY ADMITTED TO A HOSPITAL.  Has patient had a PCN reaction occurring within the last 10 years: No   . Oxycodone Other (See Comments)    UNSPECIFIED REACTION  TOLERATES APAP WITHOUT OXYCODONE   Patient Measurements: Height: 5\' 10"  (177.8 cm) Weight: 176 lb 12.9 oz (80.2 kg) IBW/kg (Calculated) : 73  Vital Signs: Temp: 97.9 F (36.6 C) (02/28 0504) Temp Source: Oral (02/28 0504) BP: 149/89 (02/28 0504) Pulse Rate: 101 (02/28 0504)  Labs: Recent Labs    11/30/18 0022  HGB 13.8  HCT 41.8  PLT 142*  LABPROT 13.5  INR 1.0  CREATININE 0.85    Estimated Creatinine Clearance: 74 mL/min (by C-G formula based on SCr of 0.85 mg/dL).   Medical History: Past Medical History:  Diagnosis Date  . Anxiety   . Arthritis   . Cervical myelopathy ([place])   . Complication of anesthesia   . GERD (gastroesophageal reflux disease)   . IBS (irritable bowel syndrome)   . On home oxygen therapy    "2L when I sleep" (04/17/2017)  . Pneumonia   . PONV (postoperative nausea and vomiting)   . Scoliosis   . Seizures ([place])   . Sleep apnea    Uses O2 at 2L while sleeping; "can't tolerate CPAP OR BIPAP" * (04/17/2017)    Assessment: 78 y/o M transfer from [facility] with GI issues. Also found to have RLE DVT on CT scan. Was given a one time dose of full dose Lovenox at 0429 on 2/27.    Doppler is neg for DVT, therefore, heparin will be converted to SQ Lovenox for prophylaxis.    Goal of Therapy:  Heparin level 0.3-0.7 units/ml Monitor platelets by anticoagulation protocol: Yes   Plan:  Dc IV heparin Lovenox 40mg  SQ qday Rx signs off  Onnie Boer, PharmD, BCIDP, AAHIVP, CPP Infectious Disease Pharmacist 11/30/2018 1:33 PM

## 2018-11-30 NOTE — Progress Notes (Signed)
Patient ID: Samuel Olson, male   DOB: Jul 29, 1941, 78 y.o.   MRN: [id number] Vascular ultrasound of IVC/iliac is negative for thrombus in IVC and iliac veins.  Heparin drip will be discontinued.  Patient will only require prophylactic dosing for DVT.

## 2018-11-30 NOTE — Progress Notes (Signed)
Was told by pharmacist/ Azucena Freed that ERCP will not happen today and to restart Heparin at rate of 11. Pt will also be on a full liquid diet.

## 2018-11-30 NOTE — Progress Notes (Signed)
Patient ID: Samuel Olson, male   DOB: 05/02/1941, 78 y.o.   MRN: [id number] Patient was admitted early this morning for question of choledocholithiasis.  MRCP shows question of hepatic duct abnormality.  Spoke to Dr. Mansouraty/GI on phone who will evaluate the patient as well.  Recommended to keep the patient n.p.o.  Also have consulted general surgery because of abnormal MRI findings.  Duplex ultrasound of lower extremities pending.  Currently on heparin drip.  Patient seen and examined at bedside.  Plan of care discussed with him and his wife at bedside.  I have reviewed patient's medical records including this morning's H&P myself.  Follow a.m. labs.

## 2018-11-30 NOTE — Progress Notes (Signed)
Was told by GI to stop heparin for ERCP today.

## 2018-11-30 NOTE — Progress Notes (Signed)
[facility] Consult: 10:24 AM 11/30/2018  LOS: 0 days    Referring Provider: Dr Starla Link  Primary Care Physician:  Elyn Aquas Primary Gastroenterologist:  In [place].  Unassigned GI patient transferred from [facility]      Reason for Consultation:  Lesion/polyp in bile duct.     HPI: Samuel Olson is a 78 y.o. male.  PMH IBS.  Remote seizures.  GERD.  Cataracts.  Scoliosis.  Hypercholesterolemia.  Osteoarthritis.  Cataracts.  OSA on CPAP.   Memory problems.  Biliary pancreatitis in 1994 Surgeries include cholecystectomy 1994, tonsillectomy oral surgery, cardiac cath, bilateral partial knee replacement Previous colonoscopies and endoscopies in [place]. Constipation.  Evaluated 2009 at [facility] by Dr. Cristy Folks.  Constipation resolved after starting on daily MiraLAX.  Home meds include but not limited to ranitidine 300 mg twice daily, MiraLAX.  On Wednesday evening he developed acute upper abdominal pain.  He had eaten some nuts a little while prior to this.  The pain persisted and his wife brought him to the [facility] emergency department at about 11:00 that night.  He underwent radiologic and lab testing CT abdomen pelvis with contrast showed retained stone in proximal CBD with dilated biliary tree.  Also noted was DVT in the right lower extremity with probable extension into the right common iliac or IVC.  T bili 0.5.  Alkaline phosphatase 143.  AST/ALT 196/100.  Lipase 79. WBC is normal.  Hgb 14.6.  Platelets 145. This morning AST/ALT 224/267 with normal bili and alk phos. MRCP/MRI reveal 1.3 cm mass versus polyp in the common hepatic duct.  Though CHD dilated at 1.8 cm, the lesion does not appear to be causing obstruction.Marland Kitchen  CBD 1.1 cm and terminates bluntly at the ampulla.  Partial  pancreas disease and.  Right adrenal adenoma.  Due to lack of GI coverage capable of performing ERCP at [facility],  he was transferred overnight to [facility].    Fm Hx heart disease with MI in both of his parents. Patient does not drink alcohol he is a former smoker.   Past Medical History:  Diagnosis Date  . Anxiety   . Arthritis   . 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 Complication of anesthesia   . GERD (gastroesophageal reflux disease)   . IBS (irritable bowel syndrome)   . On home oxygen therapy    "2L when I sleep" (04/17/2017)  . Pneumonia   . PONV (postoperative nausea and vomiting)   . Scoliosis   . Seizures ([place])   . Sleep apnea    Uses O2 at 2L while sleeping; "can't tolerate CPAP OR BIPAP" * (04/17/2017)    Past Surgical History:  Procedure Laterality Date  . BACK SURGERY    . CARDIAC CATHETERIZATION    . CHOLECYSTECTOMY    . COLONOSCOPY    . FRACTURE SURGERY     Left plates and screws  . pain stimulator   2012  . pain stimulator   2012   removed 2012  . PARTIAL KNEE ARTHROPLASTY Right 04/17/2017  Procedure: UNICOMPARTMENTAL KNEE;  Surgeon: Vickey Huger, MD;  Location: [facility];  Service: Orthopedics;  Laterality: Right;  . REPLACEMENT UNICONDYLAR JOINT KNEE Right 04/17/2017  . TONSILLECTOMY    . UVULOPALATOPHARYNGOPLASTY (UPPP)/TONSILLECTOMY/SEPTOPLASTY    . VASECTOMY      Prior to Admission medications   Medication Sig Start Date End Date Taking? Authorizing Provider  aspirin EC 325 MG EC tablet Take 1 tablet (325 mg total) by mouth 2 (two) times daily. 04/18/17   Donia Ast, PA  baclofen (LIORESAL) 20 MG tablet Take 20 mg by mouth 3 (three) times daily.    [provider]  busPIRone (BUSPAR) 5 MG tablet Take 5 mg by mouth 2 (two) times daily. 03/17/17   [provider]  celecoxib (CELEBREX) 200 MG capsule Take 200 mg by mouth daily. 03/20/17   [provider]  Cholecalciferol (VITAMIN D3 PO) Take 1,500 Units by mouth  daily.    [provider]  clarithromycin (BIAXIN) 250 MG tablet Take 500 mg by mouth daily as needed. Take 1 hour prior to dental appointments    [provider]  diclofenac sodium (VOLTAREN) 1 % GEL Apply 1 application topically 4 (four) times daily as needed. Apply according to package instructions as needed for knee pain 01/27/17   [provider]  fluvoxaMINE (LUVOX) 100 MG tablet Take 100 mg by mouth daily. 01/25/17   [provider]  Glucosamine-Chondroit-Vit C-Mn (GLUCOSAMINE 1500 COMPLEX PO) Take 1 capsule by mouth 2 (two) times daily.    [provider]  HYDROcodone-acetaminophen (NORCO) 7.5-325 MG tablet Take 1 tablet by mouth every 6 (six) hours as needed for moderate pain. 04/18/17   Donia Ast, PA  methocarbamol (ROBAXIN) 500 MG tablet Take 1-2 tablets (500-1,000 mg total) by mouth every 6 (six) hours as needed for muscle spasms. 04/18/17   Donia Ast, PA  Multiple Vitamin (MULTIVITAMIN WITH MINERALS) TABS tablet Take 1 tablet by mouth daily.    [provider]  Multiple Vitamins-Minerals (PRESERVISION AREDS PO) Take 1 tablet by mouth 2 (two) times daily.    [provider]  OVER THE COUNTER MEDICATION Take 1 capsule by mouth 2 (two) times daily. Fish oil + vit D    [provider]  OVER THE COUNTER MEDICATION Take 1 tablet by mouth 2 (two) times daily. Calcium citrate    [provider]  PHENobarbital (LUMINAL) 64.8 MG tablet Take 64.8 mg by mouth 2 (two) times daily. 02/27/17   [provider]  phenytoin (DILANTIN) 100 MG ER capsule Take 100 mg by mouth 2 (two) times daily. 03/20/17   [provider]  polycarbophil (FIBERCON) 625 MG tablet Take 625 mg by mouth 2 (two) times daily.    [provider]  polyethylene glycol (CLEARLAX) packet Take 17 g by mouth 2 (two) times daily.    [provider]  ranitidine (ZANTAC) 300 MG tablet Take 300 mg by mouth 2 (two)  times daily. 01/24/17   [provider]  simvastatin (ZOCOR) 40 MG tablet Take 40 mg by mouth every evening. for cholesterol 02/25/17   [provider]  SUPER B COMPLEX/C PO Take 1 tablet by mouth daily.    [provider]    Scheduled Meds:  Infusions: . sodium chloride 80 mL/hr at 11/30/18 0034  . heparin 1,100 Units/hr (11/30/18 0441)   PRN Meds: acetaminophen **OR** acetaminophen, fentaNYL (SUBLIMAZE) injection, hydrALAZINE, ondansetron **OR** ondansetron (ZOFRAN) IV   Allergies as of 11/29/2018 - Review Complete  04/17/2017  Allergen Reaction Noted  . Promethazine Other (See Comments) 01/26/2017  . Augmentin [amoxicillin-pot clavulanate] Other (See Comments) 04/07/2017  . Oxycodone Other (See Comments) 04/14/2017    History reviewed. No pertinent family history.  Social History   Socioeconomic History  . Marital status: Married    Spouse name: Not on file  . Number of children: Not on file  . Years of education: Not on file  . Highest education level: Not on file  Occupational History  . Not on file  Social Needs  . Financial resource strain: Not on file  . Food insecurity:    Worry: Not on file    Inability: Not on file  . Transportation needs:    Medical: Not on file    Non-medical: Not on file  Tobacco Use  . Smoking status: Former Smoker    Types: Cigarettes  . Smokeless tobacco: Never Used  Substance and Sexual Activity  . Alcohol use: No  . Drug use: No  . Sexual activity: Not on file  Lifestyle  . Physical activity:    Days per week: Not on file    Minutes per session: Not on file  . Stress: Not on file  Relationships  . Social connections:    Talks on phone: Not on file    Gets together: Not on file    Attends religious service: Not on file    Active member of club or organization: Not on file    Attends meetings of clubs or organizations: Not on file    Relationship status: Not on file  . Intimate partner violence:      Fear of current or ex partner: Not on file    Emotionally abused: Not on file    Physically abused: Not on file    Forced sexual activity: Not on file  Other Topics Concern  . Not on file  Social History Narrative  . Not on file    REVIEW OF SYSTEMS: Constitutional: Feels washed out.  Today he feels like he is burning all over but has not had sweats. ENT:  No nose bleeds Pulm: Denies difficulty breathing or cough. CV:  No palpitations, no LE edema.  GU:  No hematuria, no frequency.  Low, not amber or dark-colored.  with IV fluids he is urinating quite a bit GI: Per HPI. Heme: Denies unusual bleeding or bruising. Transfusions: None Neuro:  No headaches, no peripheral tingling or numbness Derm:  No itching, no rash or sores.  Endocrine:  No sweats or chills.  No polyuria or dysuria Immunization: Inquire as to recent immunizations.    PHYSICAL EXAM: Vital signs in last 24 hours: Vitals:   11/29/18 2248 11/30/18 0504  BP: (!) 159/99 (!) 149/89  Pulse: 72 (!) 101  Resp: 16 18  Temp: 98 F (36.7 C) 97.9 F (36.6 C)  SpO2: 100% 100%   Wt Readings from Last 3 Encounters:  11/30/18 80.2 kg  04/17/17 83.1 kg  04/10/17 83.1 kg    General: Looks tired.  Not toxic, not ill appearing. Head: No facial asymmetry or swelling.  No signs of head trauma. Eyes: No scleral icterus, no conjunctival pallor.  EOMI. Ears: Not hard of hearing. Nose: Discharge. Mouth: Oropharynx moist, pink, clear.  Tongue midline. Neck: No JVD, no masses, no thyromegaly. Lungs: Minutes breath sounds on the right.   No cough or labored breathing. Heart: RRR. Abdomen: Soft.  Not tender or distended.  No masses, no HSM, no bruits, no hernias.  Bowel sounds active..   Rectal: Deferred Musc/Skeltl: Postoperative changes in the knees.  No redness or swelling in the joints. Extremities: CCE. Neurologic: Oriented x3.  Alert.  No tremors.  No gross deficits. Skin: No jaundice.  No telangiectasia, no rash,  no sores. Tattoos: None seen. Nodes: No cervical adenopathy. Psych: Pleasant, cooperative, slightly dull affect consistent with his not feeling well..  Intake/Output from previous day: 02/27 0701 - 02/28 0700 In: 446.1 [I.V.:446.1] Out: 550 [Urine:550] Intake/Output this shift: Total I/O In: -  Out: 200 [Urine:200]  LAB RESULTS: Recent Labs    11/30/18 0022  WBC 5.5  HGB 13.8  HCT 41.8  PLT 142*   BMET Lab Results  Component Value Date   NA 137 11/30/2018   NA 135 04/18/2017   NA 138 04/10/2017   K 3.5 11/30/2018   K 3.9 04/18/2017   K 4.8 04/10/2017   CL 101 11/30/2018   CL 102 04/18/2017   CL 102 04/10/2017   CO2 28 11/30/2018   CO2 27 04/18/2017   CO2 28 04/10/2017   GLUCOSE 88 11/30/2018   GLUCOSE 88 04/18/2017   GLUCOSE 86 04/10/2017   BUN 11 11/30/2018   BUN 15 04/18/2017   BUN 21 (H) 04/10/2017   CREATININE 0.85 11/30/2018   CREATININE 0.79 04/18/2017   CREATININE 0.81 04/10/2017   CALCIUM 9.3 11/30/2018   CALCIUM 8.6 (L) 04/18/2017   CALCIUM 9.4 04/10/2017   LFT Recent Labs    11/30/18 0022  PROT 6.4*  ALBUMIN 3.8  AST 224*  ALT 267*  ALKPHOS 108  BILITOT 0.7   PT/INR Lab Results  Component Value Date   INR 1.0 11/30/2018   Hepatitis Panel No results for input(s): HEPBSAG, HCVAB, HEPAIGM, HEPBIGM in the last 72 hours. C-Diff No components found for: CDIFF Lipase     Component Value Date/Time   LIPASE 35 11/30/2018 0022    Drugs of Abuse  No results found for: LABOPIA, COCAINSCRNUR, LABBENZ, AMPHETMU, THCU, LABBARB   RADIOLOGY STUDIES: Mr 3d Recon At Scanner Mr Abdomen Mrcp Moise Boring Contast  Result Date: 11/30/2018 CLINICAL DATA:  Abnormal liver function tests with acute epigastric pain. Cholecystectomy about 10 years ago. Nausea. EXAM: MRI ABDOMEN WITHOUT AND WITH CONTRAST (INCLUDING MRCP) TECHNIQUE: Multiplanar multisequence MR imaging of the abdomen was performed both before and after the administration of intravenous contrast.  Heavily T2-weighted images of the biliary and pancreatic ducts were obtained, and three-dimensional MRCP images were rendered by post processing. CONTRAST:  8 cc Gadavist COMPARISON:  CT abdomen 11/29/2018 FINDINGS: Lower chest: Small hiatal hernia. Dextroconvex lower thoracic scoliosis. 6 mm right lower lobe nodule on image 1/15, not changed from 11/29/2018. Hepatobiliary: Cholecystectomy. There is 1.3 by 1.0 by 1.3 cm enhancing mass anteriorly in the common hepatic duct, for example as shown on subtraction image 31/18. This lesion has some accentuated precontrast T1 signal specially along its medial border on image 33/13, but does appear to diffusely enhance on subtraction images. No separate stone is identified at this lesion corresponds to the filling defect seen on the 11/29/2018 CT scan The common hepatic duct remains dilated distal to this lesion, at 1.8 cm in diameter, and the common bile duct is also mildly dilated at 1.1 cm in diameter. The distal CBD demonstrates a somewhat blunted termination in the ampulla, but without another filling defect in this area. No additional liver lesions are identified. There is minimal intrahepatic biliary dilatation. Pancreas: Dilated dorsal pancreatic duct. Partial pancreas divisum. No pancreatic mass  is identified. Spleen:  Unremarkable Adrenals/Urinary Tract: 3.0 by 1.9 cm right adrenal adenoma, with prominent dropout of signal in the lesion on out of phase images. Left adrenal gland and kidneys appear normal. Stomach/Bowel: Prominent stool throughout the colon favors constipation. Vascular/Lymphatic: Aortoiliac atherosclerotic vascular disease. No appreciable pathologic adenopathy. Other:  No supplemental non-categorized findings. Musculoskeletal: Posterolateral rod and pedicle screw fixation at L4-L5-S1. Likely chronic compression at the L2 level. Levoconvex lumbar scoliosis. IMPRESSION: 1. A 1.3 cm enhancing mass or polyp is present anteriorly in the common hepatic  duct. This has some accentuated precontrast T1 signal but clearly enhances. Possibilities include cholangiocarcinoma, and various types of polypoid lesions such as inflammatory polyp, cholesterol polyp, papilloma, or cystadenoma. Surgical referral recommended. 2. Biliary dilatation with the common hepatic duct at 1.8 cm and the common bile duct at 1.1 cm. Much of this may be due to physiologic response to cholecystectomy. The polypoid mass in the common hepatic duct does not appear obstructive, and no other mass or stone is seen in the biliary tree. The CBD terminates in a slightly blunt fashion in the region of the ampulla, but without obvious filling defect or ampullary mass. 3. Partial pancreas divisum. 4. Right adrenal adenoma. 5. Other imaging findings of potential clinical significance: Aortic Atherosclerosis (ICD10-I70.0). Prominent stool throughout the colon favors constipation. Chronic compression of the L2 vertebral body. Levoconvex lumbar scoliosis and dextroconvex lower thoracic scoliosis. Small hiatal hernia. Electronically Signed   By: Van Clines M.D.   On: 11/30/2018 10:00      IMPRESSION:   *  CHD lesion.  ? Polyp, ? Cholangiocarcinoma.   Prior cholecystectomy 1994 with associated biliary pancreatitis at that time.  *    IBS-C.  Constipation well managed with MiraLAX.  *     RLE DVT.  On IV Heparin gtt.   patient is undergo a lower extremity vascular ultrasound.  ?  Need for CT to rule out PE?  The papers from [place] do not include any chest CT.     PLAN:     *    ERCP. Dr. Jerilynn Mages was trying to get this arranged for today.  However the heparin is in place so we may need to delay ERCP when patient can have a window off heparin.   Azucena Freed  11/30/2018, 10:24 AM Phone [phone]

## 2018-11-30 NOTE — Progress Notes (Signed)
Pt cannot list medications that he is currently taking, his wife will bring list in the morning. Opyd, MD aware.

## 2018-11-30 NOTE — Progress Notes (Signed)
ANTICOAGULATION CONSULT NOTE - Initial Consult  Pharmacy Consult for Heparin  Indication: DVT  Allergies  Allergen Reactions  . Promethazine Other (See Comments)    Cramps, shakes.  . Augmentin [Amoxicillin-Pot Clavulanate] Other (See Comments)    UNKNOWN [SEE BELOW]  Has patient had a PCN reaction causing immediate rash, facial/tongue/throat swelling, SOB or lightheadedness with hypotension: Unknown Has patient had a PCN reaction causing severe rash involving mucus membranes or skin necrosis: Uknown PATIENT HAS HAD A PCN REACTION THAT REQUIRED HOSPITALIZATION: >> UNSPECIFIED REACTION WHILE HE WAS ALREADY ADMITTED TO A HOSPITAL.  Has patient had a PCN reaction occurring within the last 10 years: No   . Oxycodone Other (See Comments)    UNSPECIFIED REACTION  TOLERATES APAP WITHOUT OXYCODONE   Patient Measurements: Height: 5\' 10"  (177.8 cm) IBW/kg (Calculated) : 73  Vital Signs: Temp: 98 F (36.7 C) (02/27 2248) Temp Source: Oral (02/27 2248) BP: 159/99 (02/27 2248) Pulse Rate: 72 (02/27 2248)  Labs: Recent Labs    11/30/18 0022  HGB 13.8  HCT 41.8  PLT 142*  LABPROT 13.5  INR 1.0  CREATININE 0.85    CrCl cannot be calculated (Unknown ideal weight.).   Medical History: Past Medical History:  Diagnosis Date  . Anxiety   . Arthritis   . Cervical myelopathy ([place])   . Complication of anesthesia   . GERD (gastroesophageal reflux disease)   . IBS (irritable bowel syndrome)   . On home oxygen therapy    "2L when I sleep" (04/17/2017)  . Pneumonia   . PONV (postoperative nausea and vomiting)   . Scoliosis   . Seizures ([place])   . Sleep apnea    Uses O2 at 2L while sleeping; "can't tolerate CPAP OR BIPAP" * (04/17/2017)    Assessment: 78 y/o M transfer from [facility] with GI issues. Also found to have RLE DVT on CT scan. Was given a one time dose of full dose Lovenox at 0429 on 2/27. Will start heparin now. CBC/renal function good.   Goal of Therapy:   Heparin level 0.3-0.7 units/ml Monitor platelets by anticoagulation protocol: Yes   Plan:  -Start heparin drip at 1100 units/hr -1230 HL -Daily CBC/HL -Monitor for bleeding  Narda Bonds 11/30/2018,3:44 AM

## 2018-11-30 NOTE — H&P (Signed)
History and Physical    Samuel Olson ACZ:[id number] DOB: May 09, 1941 DOA: 11/29/2018  PCP: Elyn Aquas   Patient coming from: Home, by way of [facility]   Chief Complaint: Epigastric pain, nausea   HPI: Samuel Olson is a 78 y.o. male with medical history significant for remote seizures, GERD, memory problems, OSA with CPAP intolerance, and history of cholecystectomy approximately 10 years ago, now presenting to the emergency department for evaluation of epigastric pain and nausea.  The patient reports he had been in his usual state of health until the night of 11/28/2018 when he developed acute onset of sharp, severe, localized pain in the epigastrium.  This was associated with nausea.  He denies any fevers or chills, denies diarrhea, and does not remember experiencing these symptoms previously.  He reports some aching discomfort in the bilateral lower extremities that developed insidiously and recent weeks.  [facility] ED Course: Upon arrival to the ED, patient is found to be afebrile, slightly hypertensive, and with vitals otherwise stable.  Chemistry panel is notable for an alkaline phosphatase of 143, AST 197, ALT 100, and normal bilirubin.  Lipase was elevated to 79.  CBC was unremarkable.  CT the abdomen and pelvis was concerning for CBD dilation with question of retained stone within the CBD.  Incidental notation was made on the CT of probable venous thrombus involving the proximal right lower extremity.  Patient was given IV fluids, analgesics, and a therapeutic dose of Lovenox in the ED.  He remains hemodynamically stable and has been transferred to [facility] for further evaluation and management.  Review of Systems:  All other systems reviewed and apart from HPI, are negative.  Past Medical History:  Diagnosis Date  . Anxiety   . Arthritis   . 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 Complication of anesthesia   . GERD (gastroesophageal reflux disease)   . IBS (irritable bowel syndrome)   .  On home oxygen therapy    "2L when I sleep" (04/17/2017)  . Pneumonia   . PONV (postoperative nausea and vomiting)   . Scoliosis   . Seizures ([place])   . Sleep apnea    Uses O2 at 2L while sleeping; "can't tolerate CPAP OR BIPAP" * (04/17/2017)    Past Surgical History:  Procedure Laterality Date  . BACK SURGERY    . CARDIAC CATHETERIZATION    . CHOLECYSTECTOMY    . COLONOSCOPY    . FRACTURE SURGERY     Left plates and screws  . pain stimulator   2012  . pain stimulator   2012   removed 2012  . PARTIAL KNEE ARTHROPLASTY Right 04/17/2017   Procedure: UNICOMPARTMENTAL KNEE;  Surgeon: Vickey Huger, MD;  Location: [place];  Service: Orthopedics;  Laterality: Right;  . REPLACEMENT UNICONDYLAR JOINT KNEE Right 04/17/2017  . TONSILLECTOMY    . UVULOPALATOPHARYNGOPLASTY (UPPP)/TONSILLECTOMY/SEPTOPLASTY    . VASECTOMY       reports that he has quit smoking. His smoking use included cigarettes. He has never used smokeless tobacco. He reports that he does not drink alcohol or use drugs.  Allergies  Allergen Reactions  . Promethazine Other (See Comments)    Cramps, shakes.  . Augmentin [Amoxicillin-Pot Clavulanate] Other (See Comments)    UNKNOWN [SEE BELOW]  Has patient had a PCN reaction causing immediate rash, facial/tongue/throat swelling, SOB or lightheadedness with hypotension: Unknown Has patient had a PCN reaction causing severe rash involving mucus membranes or skin necrosis: Uknown PATIENT HAS HAD A PCN REACTION  THAT REQUIRED HOSPITALIZATION: >> UNSPECIFIED REACTION WHILE HE WAS ALREADY ADMITTED TO A HOSPITAL.  Has patient had a PCN reaction occurring within the last 10 years: No   . Oxycodone Other (See Comments)    UNSPECIFIED REACTION  TOLERATES APAP WITHOUT OXYCODONE    History reviewed. No pertinent family history.   Prior to Admission medications   Medication Sig Start Date End Date Taking? Authorizing Provider  aspirin EC 325 MG EC tablet Take 1 tablet (325 mg  total) by mouth 2 (two) times daily. 04/18/17   Donia Ast, PA  baclofen (LIORESAL) 20 MG tablet Take 20 mg by mouth 3 (three) times daily.    [provider]  busPIRone (BUSPAR) 5 MG tablet Take 5 mg by mouth 2 (two) times daily. 03/17/17   [provider]  celecoxib (CELEBREX) 200 MG capsule Take 200 mg by mouth daily. 03/20/17   [provider]  Cholecalciferol (VITAMIN D3 PO) Take 1,500 Units by mouth daily.    [provider]  clarithromycin (BIAXIN) 250 MG tablet Take 500 mg by mouth daily as needed. Take 1 hour prior to dental appointments    [provider]  diclofenac sodium (VOLTAREN) 1 % GEL Apply 1 application topically 4 (four) times daily as needed. Apply according to package instructions as needed for knee pain 01/27/17   [provider]  fluvoxaMINE (LUVOX) 100 MG tablet Take 100 mg by mouth daily. 01/25/17   [provider]  Glucosamine-Chondroit-Vit C-Mn (GLUCOSAMINE 1500 COMPLEX PO) Take 1 capsule by mouth 2 (two) times daily.    [provider]  HYDROcodone-acetaminophen (NORCO) 7.5-325 MG tablet Take 1 tablet by mouth every 6 (six) hours as needed for moderate pain. 04/18/17   Donia Ast, PA  methocarbamol (ROBAXIN) 500 MG tablet Take 1-2 tablets (500-1,000 mg total) by mouth every 6 (six) hours as needed for muscle spasms. 04/18/17   Donia Ast, PA  Multiple Vitamin (MULTIVITAMIN WITH MINERALS) TABS tablet Take 1 tablet by mouth daily.    [provider]  Multiple Vitamins-Minerals (PRESERVISION AREDS PO) Take 1 tablet by mouth 2 (two) times daily.    [provider]  OVER THE COUNTER MEDICATION Take 1 capsule by mouth 2 (two) times daily. Fish oil + vit D    [provider]  OVER THE COUNTER MEDICATION Take 1 tablet by mouth 2 (two) times daily. Calcium citrate    [provider]  PHENobarbital (LUMINAL) 64.8 MG tablet Take 64.8 mg by mouth 2 (two)  times daily. 02/27/17   [provider]  phenytoin (DILANTIN) 100 MG ER capsule Take 100 mg by mouth 2 (two) times daily. 03/20/17   [provider]  polycarbophil (FIBERCON) 625 MG tablet Take 625 mg by mouth 2 (two) times daily.    [provider]  polyethylene glycol (CLEARLAX) packet Take 17 g by mouth 2 (two) times daily.    [provider]  ranitidine (ZANTAC) 300 MG tablet Take 300 mg by mouth 2 (two) times daily. 01/24/17   [provider]  simvastatin (ZOCOR) 40 MG tablet Take 40 mg by mouth every evening. for cholesterol 02/25/17   [provider]  SUPER B COMPLEX/C PO Take 1 tablet by mouth daily.    [provider]    Physical Exam: There were no vitals filed for this visit.   Constitutional: NAD, calm  Eyes: PERTLA, lids and conjunctivae normal ENMT: Mucous membranes are moist. Posterior pharynx clear of any exudate  or lesions.   Neck: normal, supple, no masses, no thyromegaly Respiratory: clear to auscultation bilaterally, no wheezing, no crackles. Normal respiratory effort.    Cardiovascular: S1 & S2 heard, regular rate and rhythm. No extremity edema.   Abdomen: No distension, no tenderness, soft. Bowel sounds normal.  Musculoskeletal: no clubbing / cyanosis. No joint deformity upper and lower extremities.   Skin: no significant rashes, lesions, ulcers. Warm, dry, well-perfused. Neurologic: Mild dysarthria, no gross facial asymmetry. Sensation intact. Strength 5/5 in all 4 limbs.  Psychiatric:  Alert and oriented to person, place, and situation. Pleasant, cooperative.    Labs on Admission: I have personally reviewed following labs and imaging studies  CBC: No results for input(s): WBC, NEUTROABS, HGB, HCT, MCV, PLT in the last 168 hours. Basic Metabolic Panel: No results for input(s): NA, K, CL, CO2, GLUCOSE, BUN, CREATININE, CALCIUM, MG, PHOS in the last 168 hours. GFR: CrCl cannot be calculated (Patient's  most recent lab result is older than the maximum 21 days allowed.). Liver Function Tests: No results for input(s): AST, ALT, ALKPHOS, BILITOT, PROT, ALBUMIN in the last 168 hours. No results for input(s): LIPASE, AMYLASE in the last 168 hours. No results for input(s): AMMONIA in the last 168 hours. Coagulation Profile: No results for input(s): INR, PROTIME in the last 168 hours. Cardiac Enzymes: No results for input(s): CKTOTAL, CKMB, CKMBINDEX, TROPONINI in the last 168 hours. BNP (last 3 results) No results for input(s): PROBNP in the last 8760 hours. HbA1C: No results for input(s): HGBA1C in the last 72 hours. CBG: No results for input(s): GLUCAP in the last 168 hours. Lipid Profile: No results for input(s): CHOL, HDL, LDLCALC, TRIG, CHOLHDL, LDLDIRECT in the last 72 hours. Thyroid Function Tests: No results for input(s): TSH, T4TOTAL, FREET4, T3FREE, THYROIDAB in the last 72 hours. Anemia Panel: No results for input(s): VITAMINB12, FOLATE, FERRITIN, TIBC, IRON, RETICCTPCT in the last 72 hours. Urine analysis: No results found for: COLORURINE, APPEARANCEUR, LABSPEC, PHURINE, GLUCOSEU, HGBUR, BILIRUBINUR, KETONESUR, PROTEINUR, UROBILINOGEN, NITRITE, LEUKOCYTESUR Sepsis Labs: @LABRCNTIP (procalcitonin:4,lacticidven:4) )No results found for this or any previous visit (from the past 240 hour(s)).   Radiological Exams on Admission: No results found.  EKG: Not performed.   Assessment/Plan  1. Acute epigastric pain; ?choledocholithasis  - Presents with acute-onset of epigastric pain and nausea, found to have new elevation in transaminases, lipase, and alkaline phosphatase  - CT abd/pelvis with CBD dilation and possible retained CBD stone  - He is afebrile and there is no leukocytosis  - Check MRCP, continue bowel rest and supportive care with IVF hydration, analgesia, and antiemetics    2. Reg leg DVT  - Reports insidious development of bilateral LE aches  - Probable proximal  right leg DVT noted on CT abd/pelvis in ED  - He was given a therapeutic dose of Lovenox in ED  - Check bilateral venous US, continue anticoagulation with heparin infusion  - No chest pain, cough, SOB, or hypoxia to suggest PE    3. Hx of seizures  - Patient reports no seizure in 50 yrs  - He is in the process of being tapered off of his antiepileptics, is unsure if he is still taking any, but states wife will be here in am with med list     DVT prophylaxis: IV heparin  Code Status: Full  Family Communication: Discussed with patient  Consults called: none Admission status: observation     Vianne Bulls, MD [facility] Pager [phone]  If 7PM-7AM, please contact  night-coverage [email or web address] Password [email or web address]  11/30/2018, 12:08 AM

## 2018-12-01 DIAGNOSIS — Q445 Other congenital malformations of bile ducts: Secondary | ICD-10-CM | POA: Diagnosis not present

## 2018-12-01 DIAGNOSIS — R569 Unspecified convulsions: Secondary | ICD-10-CM | POA: Diagnosis not present

## 2018-12-01 DIAGNOSIS — K8051 Calculus of bile duct without cholangitis or cholecystitis with obstruction: Secondary | ICD-10-CM | POA: Diagnosis not present

## 2018-12-01 DIAGNOSIS — R945 Abnormal results of liver function studies: Secondary | ICD-10-CM | POA: Diagnosis not present

## 2018-12-01 LAB — COMPREHENSIVE METABOLIC PANEL
ALT: 167 U/L — ABNORMAL HIGH (ref 0–44)
AST: 92 U/L — ABNORMAL HIGH (ref 15–41)
Albumin: 3.7 g/dL (ref 3.5–5.0)
Alkaline Phosphatase: 112 U/L (ref 38–126)
Anion gap: 7 (ref 5–15)
BUN: 10 mg/dL (ref 8–23)
CO2: 29 mmol/L (ref 22–32)
Calcium: 9.2 mg/dL (ref 8.9–10.3)
Chloride: 101 mmol/L (ref 98–111)
Creatinine, Ser: 0.81 mg/dL (ref 0.61–1.24)
GFR calc Af Amer: >60 mL/min (ref >60)
GFR calc non Af Amer: >60 mL/min (ref >60)
Glucose, Bld: 95 mg/dL (ref 70–99)
Potassium: 3.5 mmol/L (ref 3.5–5.1)
Sodium: 137 mmol/L (ref 135–145)
Total Bilirubin: 0.8 mg/dL (ref 0.3–1.2)
Total Protein: 6.7 g/dL (ref 6.5–8.1)

## 2018-12-01 LAB — CBC WITH DIFFERENTIAL/PLATELET
Abs Immature Granulocytes: 0.01 10*3/uL (ref 0.00–0.07)
Basophils Absolute: 0 10*3/uL (ref 0.0–0.1)
Basophils Relative: 0 %
Eosinophils Absolute: 0.2 10*3/uL (ref 0.0–0.5)
Eosinophils Relative: 3 %
HCT: 40.8 % (ref 39.0–52.0)
Hemoglobin: 13.9 g/dL (ref 13.0–17.0)
Immature Granulocytes: 0 %
Lymphocytes Relative: 20 %
Lymphs Abs: 1.3 10*3/uL (ref 0.7–4.0)
MCH: 32.9 pg (ref 26.0–34.0)
MCHC: 34.1 g/dL (ref 30.0–36.0)
MCV: 96.5 fL (ref 80.0–100.0)
Monocytes Absolute: 0.5 10*3/uL (ref 0.1–1.0)
Monocytes Relative: 8 %
Neutro Abs: 4.6 10*3/uL (ref 1.7–7.7)
Neutrophils Relative %: 69 %
Platelets: 146 10*3/uL — ABNORMAL LOW (ref 150–400)
RBC: 4.23 MIL/uL (ref 4.22–5.81)
RDW: 11.9 % (ref 11.5–15.5)
WBC: 6.7 10*3/uL (ref 4.0–10.5)
nRBC: 0 % (ref 0.0–0.2)

## 2018-12-01 LAB — MAGNESIUM: Magnesium: 2 mg/dL (ref 1.7–2.4)

## 2018-12-01 LAB — GLUCOSE, CAPILLARY: Glucose-Capillary: 86 mg/dL (ref 70–99)

## 2018-12-01 MED ORDER — MELATONIN 3 MG PO TABS
3.0000 mg | ORAL_TABLET | Freq: Once | ORAL | Status: AC
  Administered 2018-12-01: 3 mg via ORAL
  Filled 2018-12-01: qty 1

## 2018-12-01 MED ORDER — METHOCARBAMOL 500 MG PO TABS
500.0000 mg | ORAL_TABLET | Freq: Once | ORAL | Status: AC
  Administered 2018-12-01: 500 mg via ORAL
  Filled 2018-12-01: qty 1

## 2018-12-01 MED ORDER — TRAMADOL HCL 50 MG PO TABS: 50.0000 mg | ORAL_TABLET | Freq: Four times a day (QID) | ORAL | 0 refills | Status: DC | PRN

## 2018-12-01 MED ORDER — ONDANSETRON HCL 4 MG PO TABS: 4.0000 mg | ORAL_TABLET | Freq: Four times a day (QID) | ORAL | 0 refills | Status: DC | PRN

## 2018-12-01 NOTE — Progress Notes (Signed)
Pt given discharge instructions, prescriptions, and care notes. Pt verbalized understanding AEB no further questions or concerns at this time. IV was discontinued, no redness, pain, or swelling noted at this time. Pt left the floor via wheelchair with staff in stable condition and in no distress. Skin dry and intact.

## 2018-12-01 NOTE — Discharge Summary (Signed)
Physician Discharge Summary  Samuel Olson ZYS:[id number] DOB: 06-Jan-1941 DOA: 11/29/2018  PCP: Elyn Aquas  Admit date: 11/29/2018 Discharge date: 12/01/2018  Admitted From: Home Disposition: Home  Recommendations for Outpatient Follow-up:  1. Follow up with PCP in 1 week recapitulated/CMP 2. Outpatient follow-up with GI/Dr. Rush Landmark and general surgery as well 3. Follow up in ED if symptoms worsen or new appear   Home Health:  No Equipment/Devices: None  Discharge Condition: Stable CODE STATUS: Full Diet recommendation: Heart healthy  Brief/Interim Summary: 78 year old male with history of remote seizures, GERD, memory problems, OSA with CPAP intolerance, cholecystectomy presented with epigastric pain and nausea.  He was transferred from [facility] because of elevated LFTs, normal bilirubin, CT of the abdomen and pelvis concerning for CBD dilatation with question of retained stone within CBD and there was an incidental finding of probable venous thrombus involving the proximal right lower extremity.  He was started on heparin drip and was transferred to [facility].  He underwent MRCP which showed probable common hepatic duct polyp/mass.  GI and general surgery were consulted.  GI recommended outpatient ERCP.  IVC/iliac ultrasound showed no DVT.  Heparin drip was discontinued.  Patient is currently having no abdominal pain.  If he tolerates his diet this morning, he will be discharged home with outpatient follow-up.  Discharge Diagnoses:  Principal Problem:   Choledocholithiasis Active Problems:   Seizures (HCC)   Anxiety   Elevated LFTs   Biliary obstruction   Deep vein thrombosis (DVT) of right lower extremity (HCC)  Abdominal pain with abnormal LFTs -Initially there was a question of choledocholithiasis with CBD dilatation on CT of the abdomen and pelvis.  But on MRCP, there was no choledocholithiasis, there was CBD dilation; the common hepatic duct polyp/mass  with concern for cholangiocarcinoma versus polypoid lesion.  GI and general surgery were consulted.  He had recommended outpatient ERCP.  Currently he is not having any abdominal pain.  LFTs are improving.  If he tolerates that today, he will be discharged home with outpatient follow-up with GI and general surgery  Question of right proximal DVT -Incidentally seen on the CT of abdomen and pelvis.  Iliac/IVC ultrasound was negative for thrombus in IVC and IJ veins.  Heparin drip was discontinued.  history of seizures -Continue home regimen.  Outpatient follow-up   Discharge Instructions  Discharge Instructions    Ambulatory referral to Gastroenterology   Complete by:  As directed    Needs ERCP   What is the reason for referral?:  Other   Call MD for:  difficulty breathing, headache or visual disturbances   Complete by:  As directed    Call MD for:  extreme fatigue   Complete by:  As directed    Call MD for:  hives   Complete by:  As directed    Call MD for:  persistant dizziness or light-headedness   Complete by:  As directed    Call MD for:  persistant nausea and vomiting   Complete by:  As directed    Call MD for:  severe uncontrolled pain   Complete by:  As directed    Call MD for:  temperature >100.4   Complete by:  As directed    Diet - low sodium heart healthy   Complete by:  As directed    Increase activity slowly   Complete by:  As directed      Allergies as of 12/01/2018      Reactions  Promethazine Other (See Comments)   Cramps, shakes.   Augmentin [amoxicillin-pot Clavulanate] Other (See Comments)   UNKNOWN [SEE BELOW] Has patient had a PCN reaction causing immediate rash, facial/tongue/throat swelling, SOB or lightheadedness with hypotension: Unknown Has patient had a PCN reaction causing severe rash involving mucus membranes or skin necrosis: Uknown PATIENT HAS HAD A PCN REACTION THAT REQUIRED HOSPITALIZATION: >> UNSPECIFIED REACTION WHILE HE WAS ALREADY  ADMITTED TO A HOSPITAL.  Has patient had a PCN reaction occurring within the last 10 years: No   Oxycodone Other (See Comments)   UNSPECIFIED REACTION  TOLERATES APAP WITHOUT OXYCODONE      Medication List    STOP taking these medications   aspirin 325 MG EC tablet   HYDROcodone-acetaminophen 7.5-325 MG tablet Commonly known as:  NORCO   methocarbamol 500 MG tablet Commonly known as:  ROBAXIN   ranitidine 300 MG tablet Commonly known as:  ZANTAC     TAKE these medications   acetaminophen 500 MG tablet Commonly known as:  TYLENOL Take 1,000 mg by mouth every 6 (six) hours as needed for mild pain or headache.   baclofen 20 MG tablet Commonly known as:  LIORESAL Take 20 mg by mouth 2 (two) times daily.   busPIRone 5 MG tablet Commonly known as:  BUSPAR Take 5 mg by mouth 2 (two) times daily.   celecoxib 200 MG capsule Commonly known as:  CELEBREX Take 200 mg by mouth daily.   cetirizine 10 MG tablet Commonly known as:  ZYRTEC Take 10 mg by mouth daily as needed for allergies.   clarithromycin 250 MG tablet Commonly known as:  BIAXIN Take 500 mg by mouth daily as needed. Take 1 hour prior to dental appointments   CLEARLAX packet Generic drug:  polyethylene glycol Take 17 g by mouth daily.   diclofenac sodium 1 % Gel Commonly known as:  VOLTAREN Apply 1 application topically 4 (four) times daily as needed. Apply according to package instructions as needed for knee pain   famotidine 40 MG tablet Commonly known as:  PEPCID Take 40 mg by mouth 2 (two) times daily. Taking once daily (40mg )   FIBERCON 625 MG tablet Generic drug:  polycarbophil Take 625 mg by mouth 2 (two) times daily.   fluvoxaMINE 100 MG tablet Commonly known as:  LUVOX Take 100 mg by mouth daily.   GLUCOSAMINE 1500 COMPLEX PO Take 1 capsule by mouth 2 (two) times daily.   multivitamin with minerals Tabs tablet Take 1 tablet by mouth daily.   ondansetron 4 MG tablet Commonly known as:   ZOFRAN Take 1 tablet (4 mg total) by mouth every 6 (six) hours as needed for nausea.   OVER THE COUNTER MEDICATION Take 1 capsule by mouth 2 (two) times daily. Fish oil + vit D   OVER THE COUNTER MEDICATION Take 1 tablet by mouth 2 (two) times daily. Calcium citrate   OXYGEN Inhale 2 L into the lungs at bedtime.   PHENobarbital 64.8 MG tablet Commonly known as:  LUMINAL Take 64.8 mg by mouth at bedtime.   phenytoin 100 MG ER capsule Commonly known as:  DILANTIN Take 100 mg by mouth 2 (two) times daily.   PRESERVISION AREDS PO Take 1 tablet by mouth 2 (two) times daily.   simvastatin 40 MG tablet Commonly known as:  ZOCOR Take 40 mg by mouth every evening. for cholesterol   SUPER B COMPLEX/C PO Take 1 tablet by mouth daily.   traMADol 50 MG tablet Commonly known  as:  ULTRAM Take 1 tablet (50 mg total) by mouth every 6 (six) hours as needed for moderate pain.   VITAMIN D3 PO Take 1,500 Units by mouth daily.      Follow-up Information    Elyn Aquas. Schedule an appointment as soon as possible for a visit in 1 week(s).   Specialty:  Family Medicine Why:  with cbc/cmp Contact information: [address] [place] [address] [phone]        Mansouraty, Telford Nab., MD. Schedule an appointment as soon as possible for a visit in 1 week(s).   Specialties:  Gastroenterology, Internal Medicine Contact information: [place] [address] [phone]        Excell Seltzer, MD Follow up.   Specialty:  General Surgery Contact information: [address] [place] [address] [phone]          Allergies  Allergen Reactions  . Promethazine Other (See Comments)    Cramps, shakes.  . Augmentin [Amoxicillin-Pot Clavulanate] Other (See Comments)    UNKNOWN [SEE BELOW]  Has patient had a PCN reaction causing immediate rash, facial/tongue/throat swelling, SOB or lightheadedness with hypotension: Unknown Has patient  had a PCN reaction causing severe rash involving mucus membranes or skin necrosis: Uknown PATIENT HAS HAD A PCN REACTION THAT REQUIRED HOSPITALIZATION: >> UNSPECIFIED REACTION WHILE HE WAS ALREADY ADMITTED TO A HOSPITAL.  Has patient had a PCN reaction occurring within the last 10 years: No   . Oxycodone Other (See Comments)    UNSPECIFIED REACTION  TOLERATES APAP WITHOUT OXYCODONE    Consultations:  GI/general surgery   Procedures/Studies: Mr 3d Recon At Scanner  Result Date: 11/30/2018 CLINICAL DATA:  Abnormal liver function tests with acute epigastric pain. Cholecystectomy about 10 years ago. Nausea. EXAM: MRI ABDOMEN WITHOUT AND WITH CONTRAST (INCLUDING MRCP) TECHNIQUE: Multiplanar multisequence MR imaging of the abdomen was performed both before and after the administration of intravenous contrast. Heavily T2-weighted images of the biliary and pancreatic ducts were obtained, and three-dimensional MRCP images were rendered by post processing. CONTRAST:  8 cc Gadavist COMPARISON:  CT abdomen 11/29/2018 FINDINGS: Lower chest: Small hiatal hernia. Dextroconvex lower thoracic scoliosis. 6 mm right lower lobe nodule on image 1/15, not changed from 11/29/2018. Hepatobiliary: Cholecystectomy. There is 1.3 by 1.0 by 1.3 cm enhancing mass anteriorly in the common hepatic duct, for example as shown on subtraction image 31/18. This lesion has some accentuated precontrast T1 signal specially along its medial border on image 33/13, but does appear to diffusely enhance on subtraction images. No separate stone is identified at this lesion corresponds to the filling defect seen on the 11/29/2018 CT scan The common hepatic duct remains dilated distal to this lesion, at 1.8 cm in diameter, and the common bile duct is also mildly dilated at 1.1 cm in diameter. The distal CBD demonstrates a somewhat blunted termination in the ampulla, but without another filling defect in this area. No additional liver lesions are  identified. There is minimal intrahepatic biliary dilatation. Pancreas: Dilated dorsal pancreatic duct. Partial pancreas divisum. No pancreatic mass is identified. Spleen:  Unremarkable Adrenals/Urinary Tract: 3.0 by 1.9 cm right adrenal adenoma, with prominent dropout of signal in the lesion on out of phase images. Left adrenal gland and kidneys appear normal. Stomach/Bowel: Prominent stool throughout the colon favors constipation. Vascular/Lymphatic: Aortoiliac atherosclerotic vascular disease. No appreciable pathologic adenopathy. Other:  No supplemental non-categorized findings. Musculoskeletal: Posterolateral rod and pedicle screw fixation at L4-L5-S1. Likely chronic compression  at the L2 level. Levoconvex lumbar scoliosis. IMPRESSION: 1. A 1.3 cm enhancing mass or polyp is present anteriorly in the common hepatic duct. This has some accentuated precontrast T1 signal but clearly enhances. Possibilities include cholangiocarcinoma, and various types of polypoid lesions such as inflammatory polyp, cholesterol polyp, papilloma, or cystadenoma. Surgical referral recommended. 2. Biliary dilatation with the common hepatic duct at 1.8 cm and the common bile duct at 1.1 cm. Much of this may be due to physiologic response to cholecystectomy. The polypoid mass in the common hepatic duct does not appear obstructive, and no other mass or stone is seen in the biliary tree. The CBD terminates in a slightly blunt fashion in the region of the ampulla, but without obvious filling defect or ampullary mass. 3. Partial pancreas divisum. 4. Right adrenal adenoma. 5. Other imaging findings of potential clinical significance: Aortic Atherosclerosis (ICD10-I70.0). Prominent stool throughout the colon favors constipation. Chronic compression of the L2 vertebral body. Levoconvex lumbar scoliosis and dextroconvex lower thoracic scoliosis. Small hiatal hernia. Electronically Signed   By: Van Clines M.D.   On: 11/30/2018 10:00    Vas [place] Ivc/iliac (venous Only)  Result Date: 11/30/2018 IVC/ILIAC STUDY Indications: Possible EIV thrombus seen on recent CT  Performing Technologist: Maudry Mayhew MHA, RDMS, RVT, RDCS  Examination Guidelines: A complete evaluation includes B-mode imaging, spectral Doppler, color Doppler, and power Doppler as needed of all accessible portions of each vessel. Bilateral testing is considered an integral part of a complete examination. Limited examinations for reoccurring indications may be performed as noted.  IVC/Iliac Findings: +----------+------+--------+    IVC    PatentComments +----------+------+--------+ IVC Prox  patent         +----------+------+--------+ IVC Mid   patent         +----------+------+--------+ IVC Distalpatent         +----------+------+--------+  +------------+---------+---------+--------+     CIV     RT-PatentLT-PatentComments +------------+---------+---------+--------+ Common Iliac patent   patent           +------------+---------+---------+--------+ +-------------------+---------+---------+--------+         EIV        RT-PatentLT-PatentComments +-------------------+---------+---------+--------+ External Iliac Vein patent   patent           +-------------------+---------+---------+--------+  Summary: IVC/Iliac: No evidence of thrombus in IVC and Iliac veins.  *See table(s) above for measurements and observations.  Electronically signed by Servando Snare MD on 11/30/2018 at 12:06:35 PM.   Final    Mr Abdomen Mrcp W Wo Contast  Result Date: 11/30/2018 CLINICAL DATA:  Abnormal liver function tests with acute epigastric pain. Cholecystectomy about 10 years ago. Nausea. EXAM: MRI ABDOMEN WITHOUT AND WITH CONTRAST (INCLUDING MRCP) TECHNIQUE: Multiplanar multisequence MR imaging of the abdomen was performed both before and after the administration of intravenous contrast. Heavily T2-weighted images of the biliary and pancreatic ducts were obtained,  and three-dimensional MRCP images were rendered by post processing. CONTRAST:  8 cc Gadavist COMPARISON:  CT abdomen 11/29/2018 FINDINGS: Lower chest: Small hiatal hernia. Dextroconvex lower thoracic scoliosis. 6 mm right lower lobe nodule on image 1/15, not changed from 11/29/2018. Hepatobiliary: Cholecystectomy. There is 1.3 by 1.0 by 1.3 cm enhancing mass anteriorly in the common hepatic duct, for example as shown on subtraction image 31/18. This lesion has some accentuated precontrast T1 signal specially along its medial border on image 33/13, but does appear to diffusely enhance on subtraction images. No separate stone is identified at this lesion corresponds to the filling defect seen on the 11/29/2018 CT  scan The common hepatic duct remains dilated distal to this lesion, at 1.8 cm in diameter, and the common bile duct is also mildly dilated at 1.1 cm in diameter. The distal CBD demonstrates a somewhat blunted termination in the ampulla, but without another filling defect in this area. No additional liver lesions are identified. There is minimal intrahepatic biliary dilatation. Pancreas: Dilated dorsal pancreatic duct. Partial pancreas divisum. No pancreatic mass is identified. Spleen:  Unremarkable Adrenals/Urinary Tract: 3.0 by 1.9 cm right adrenal adenoma, with prominent dropout of signal in the lesion on out of phase images. Left adrenal gland and kidneys appear normal. Stomach/Bowel: Prominent stool throughout the colon favors constipation. Vascular/Lymphatic: Aortoiliac atherosclerotic vascular disease. No appreciable pathologic adenopathy. Other:  No supplemental non-categorized findings. Musculoskeletal: Posterolateral rod and pedicle screw fixation at L4-L5-S1. Likely chronic compression at the L2 level. Levoconvex lumbar scoliosis. IMPRESSION: 1. A 1.3 cm enhancing mass or polyp is present anteriorly in the common hepatic duct. This has some accentuated precontrast T1 signal but clearly enhances.  Possibilities include cholangiocarcinoma, and various types of polypoid lesions such as inflammatory polyp, cholesterol polyp, papilloma, or cystadenoma. Surgical referral recommended. 2. Biliary dilatation with the common hepatic duct at 1.8 cm and the common bile duct at 1.1 cm. Much of this may be due to physiologic response to cholecystectomy. The polypoid mass in the common hepatic duct does not appear obstructive, and no other mass or stone is seen in the biliary tree. The CBD terminates in a slightly blunt fashion in the region of the ampulla, but without obvious filling defect or ampullary mass. 3. Partial pancreas divisum. 4. Right adrenal adenoma. 5. Other imaging findings of potential clinical significance: Aortic Atherosclerosis (ICD10-I70.0). Prominent stool throughout the colon favors constipation. Chronic compression of the L2 vertebral body. Levoconvex lumbar scoliosis and dextroconvex lower thoracic scoliosis. Small hiatal hernia. Electronically Signed   By: Van Clines M.D.   On: 11/30/2018 10:00   Vas [place] Lower Extremity Venous (dvt)  Result Date: 11/30/2018  Lower Venous Study Indications: Pain, and possible right external iliac vein thrombus seen on CT.  Performing Technologist: Maudry Mayhew MHA, RDMS, RVT, RDCS  Examination Guidelines: A complete evaluation includes B-mode imaging, spectral Doppler, color Doppler, and power Doppler as needed of all accessible portions of each vessel. Bilateral testing is considered an integral part of a complete examination. Limited examinations for reoccurring indications may be performed as noted.  Right Venous Findings: +---------+---------------+---------+-----------+----------+-------+          CompressibilityPhasicitySpontaneityPropertiesSummary +---------+---------------+---------+-----------+----------+-------+ CFV      Full           Yes      Yes                           +---------+---------------+---------+-----------+----------+-------+ SFJ      Full                                                 +---------+---------------+---------+-----------+----------+-------+ FV Prox  Full                                                 +---------+---------------+---------+-----------+----------+-------+ FV Mid   Full                                                 +---------+---------------+---------+-----------+----------+-------+  FV DistalFull                                                 +---------+---------------+---------+-----------+----------+-------+ PFV      Full                                                 +---------+---------------+---------+-----------+----------+-------+ POP      Full           Yes      Yes                          +---------+---------------+---------+-----------+----------+-------+ PTV      Full                                                 +---------+---------------+---------+-----------+----------+-------+ PERO     Full                                                 +---------+---------------+---------+-----------+----------+-------+  Left Venous Findings: +---------+---------------+---------+-----------+----------+-------+          CompressibilityPhasicitySpontaneityPropertiesSummary +---------+---------------+---------+-----------+----------+-------+ CFV      Full           Yes      Yes                          +---------+---------------+---------+-----------+----------+-------+ SFJ      Full                                                 +---------+---------------+---------+-----------+----------+-------+ FV Prox  Full                                                 +---------+---------------+---------+-----------+----------+-------+ FV Mid   Full                                                  +---------+---------------+---------+-----------+----------+-------+ FV DistalFull                                                 +---------+---------------+---------+-----------+----------+-------+ PFV      Full                                                 +---------+---------------+---------+-----------+----------+-------+  POP      Full           Yes      Yes                          +---------+---------------+---------+-----------+----------+-------+ PTV      Full                                                 +---------+---------------+---------+-----------+----------+-------+ PERO     Full                                                 +---------+---------------+---------+-----------+----------+-------+    Summary: Right: There is no evidence of deep vein thrombosis in the lower extremity. No cystic structure found in the popliteal fossa. Left: There is no evidence of deep vein thrombosis in the lower extremity. No cystic structure found in the popliteal fossa.  *See table(s) above for measurements and observations. Electronically signed by Servando Snare MD on 11/30/2018 at 12:07:49 PM.    Final        Subjective: Patient seen and examined at bedside.  He denies any current abdominal pain, nausea or vomiting.  He wants to go home.  No overnight fever or vomiting.  Discharge Exam: Vitals:   11/30/18 2210 12/01/18 0510  BP: (!) 153/85 (!) 145/86  Pulse: 73 60  Resp: 20 18  Temp: 98.6 F (37 C) (!) 97.5 F (36.4 C)  SpO2: 93% 99%   Vitals:   11/30/18 1846 11/30/18 2210 11/30/18 2225 12/01/18 0510  BP: (!) 155/92 (!) 153/85  (!) 145/86  Pulse: 70 73  60  Resp:  20  18  Temp:  98.6 F (37 C)  (!) 97.5 F (36.4 C)  TempSrc:  Oral  Oral  SpO2: 95% 93%  99%  Weight:   77.1 kg   Height:        General: Pt is alert, awake, not in acute distress Cardiovascular: rate controlled, S1/S2 + Respiratory: bilateral decreased breath sounds at  bases Abdominal: Soft, NT, ND, bowel sounds + Extremities: no edema, no cyanosis    The results of significant diagnostics from this hospitalization (including imaging, microbiology, ancillary and laboratory) are listed below for reference.     Microbiology: No results found for this or any previous visit (from the past 240 hour(s)).   Labs: BNP (last 3 results) No results for input(s): BNP in the last 8760 hours. Basic Metabolic Panel: Recent Labs  Lab 11/30/18 0022 12/01/18 0359  NA 137 137  K 3.5 3.5  CL 101 101  CO2 28 29  GLUCOSE 88 95  BUN 11 10  CREATININE 0.85 0.81  CALCIUM 9.3 9.2  MG  --  2.0   Liver Function Tests: Recent Labs  Lab 11/30/18 0022 12/01/18 0359  AST 224* 92*  ALT 267* 167*  ALKPHOS 108 112  BILITOT 0.7 0.8  PROT 6.4* 6.7  ALBUMIN 3.8 3.7   Recent Labs  Lab 11/30/18 0022  LIPASE 35   No results for input(s): AMMONIA in the last 168 hours. CBC: Recent Labs  Lab 11/30/18 0022 12/01/18 0359  WBC 5.5 6.7  NEUTROABS 3.8 4.6  HGB  13.8 13.9  HCT 41.8 40.8  MCV 97.2 96.5  PLT 142* 146*   Cardiac Enzymes: No results for input(s): CKTOTAL, CKMB, CKMBINDEX, TROPONINI in the last 168 hours. BNP: Invalid input(s): POCBNP CBG: Recent Labs  Lab 12/01/18 0759  GLUCAP 86   D-Dimer No results for input(s): DDIMER in the last 72 hours. Hgb A1c No results for input(s): HGBA1C in the last 72 hours. Lipid Profile No results for input(s): CHOL, HDL, LDLCALC, TRIG, CHOLHDL, LDLDIRECT in the last 72 hours. Thyroid function studies No results for input(s): TSH, T4TOTAL, T3FREE, THYROIDAB in the last 72 hours.  Invalid input(s): FREET3 Anemia work up No results for input(s): VITAMINB12, FOLATE, FERRITIN, TIBC, IRON, RETICCTPCT in the last 72 hours. Urinalysis No results found for: COLORURINE, APPEARANCEUR, LABSPEC, [place], GLUCOSEU, HGBUR, BILIRUBINUR, KETONESUR, PROTEINUR, UROBILINOGEN, NITRITE, LEUKOCYTESUR Sepsis Labs Invalid  input(s): PROCALCITONIN,  WBC,  LACTICIDVEN Microbiology No results found for this or any previous visit (from the past 240 hour(s)).   Time coordinating discharge: 35 minutes  SIGNED:   Aline August, MD  [facility] 12/01/2018, 9:14 AM

## 2018-12-03 ENCOUNTER — Other Ambulatory Visit: Payer: Self-pay

## 2018-12-03 ENCOUNTER — Telehealth: Payer: Self-pay

## 2018-12-03 DIAGNOSIS — K8050 Calculus of bile duct without cholangitis or cholecystitis without obstruction: Secondary | ICD-10-CM

## 2018-12-03 DIAGNOSIS — R7989 Other specified abnormal findings of blood chemistry: Secondary | ICD-10-CM

## 2018-12-03 DIAGNOSIS — K831 Obstruction of bile duct: Secondary | ICD-10-CM

## 2018-12-03 DIAGNOSIS — R945 Abnormal results of liver function studies: Secondary | ICD-10-CM

## 2018-12-03 NOTE — Telephone Encounter (Signed)
ERCP 12/10/18 at 730 am [facility] with Dr Rush Landmark -- previous patients were moved and notified to make a spot for this pt.  ERCP scheduled, pt instructed and medications reviewed.  Patient instructions mailed to home.  Patient to call with any questions or concerns.

## 2018-12-03 NOTE — Telephone Encounter (Signed)
-----   Message from Irving Copas., MD sent at 12/01/2018  6:42 AM EST ----- Regarding: Patient for ERCP add on Davante Gerke,This patient needs an outpatient ERCP scheduled as a 2-2.5-hour procedure. Let see, if we can do the following switches: On March 9 if Eulas Post and Weston Settle can be moved to March 16th or further, then we can put this individual on for the ERCP as the first case that day. The other individuals procedures are not as urgent, so that would be great. Please let me know. We will let you know if the patient ends up having to be done as an inpatient should something change. Thanks as always. Chester Holstein

## 2018-12-06 ENCOUNTER — Telehealth: Payer: Self-pay | Admitting: Gastroenterology

## 2018-12-06 NOTE — Telephone Encounter (Signed)
The pt's wife was advised that [facility] has valet parking and the pt should be on a bland diet until appt.  If he develops abd pain that is severe he should go to the ED.  The pt has been advised of the information and verbalized understanding.

## 2018-12-06 NOTE — Telephone Encounter (Signed)
Pt is sched for EGD at [facility] 3.9.20.  Pt's wife called to ask for parking instructions, as she has back problems and cannot walk very far.    Wife also reported that pt had abd p after eating a grilled cheese and peaches.  Please advise.

## 2018-12-07 ENCOUNTER — Encounter (HOSPITAL_COMMUNITY): Payer: Self-pay | Admitting: *Deleted

## 2018-12-07 ENCOUNTER — Other Ambulatory Visit: Payer: Self-pay

## 2018-12-07 NOTE — Progress Notes (Signed)
SDW-pre-op call completed by pt spouse with verbal consent from pt. Spouse denies that pt C/O SOB and chest pain. Spouse denies that pt is under the care of a cardiologist. Spouse made aware to have pt stop taking vitamins, fish oil, Glucosamine and herbal medications. Do not take any NSAIDs ie: Ibuprofen, Advil, Naproxen (Aleve), Motrin BC and Goody Powder, Celebrex or any medication containing Aspirin. Spouse verbalized understanding of all pre-op instructions.

## 2018-12-09 NOTE — Anesthesia Preprocedure Evaluation (Addendum)
Anesthesia Evaluation  Patient identified by MRN, date of birth, ID band Patient awake    Reviewed: Allergy & Precautions, NPO status , Patient's Chart, lab work & pertinent test results  History of Anesthesia Complications (+) PONV  Airway Mallampati: I  TM Distance: >3 FB Neck ROM: Limited   Comment: ankylosed Dental  (+) Dental Advisory Given, Chipped   Pulmonary sleep apnea and Oxygen sleep apnea , COPD,  oxygen dependent, former smoker,    breath sounds clear to auscultation       Cardiovascular (-) anginanegative cardio ROS   Rhythm:Regular Rate:Normal     Neuro/Psych Seizures -, Well Controlled,  Anxiety    GI/Hepatic Neg liver ROS, GERD  Medicated and Controlled,Biliary obstruction   Endo/Other  negative endocrine ROS  Renal/GU negative Renal ROS     Musculoskeletal  (+) Arthritis , scoliosis   Abdominal   Peds  Hematology negative hematology ROS (+)   Anesthesia Other Findings   Reproductive/Obstetrics                            Anesthesia Physical Anesthesia Plan  ASA: III  Anesthesia Plan: General   Post-op Pain Management:    Induction: Intravenous  PONV Risk Score and Plan: 2 and Ondansetron and Dexamethasone  Airway Management Planned: Oral ETT and Video Laryngoscope Planned  Additional Equipment:   Intra-op Plan:   Post-operative Plan: Extubation in OR  Informed Consent: I have reviewed the patients History and Physical, chart, labs and discussed the procedure including the risks, benefits and alternatives for the proposed anesthesia with the patient or authorized representative who has indicated his/her understanding and acceptance.     Dental advisory given  Plan Discussed with: CRNA and Surgeon  Anesthesia Plan Comments:        Anesthesia Quick Evaluation

## 2018-12-10 ENCOUNTER — Ambulatory Visit (HOSPITAL_COMMUNITY)
Admission: RE | Admit: 2018-12-10 | Discharge: 2018-12-10 | Disposition: A | Discharge status: Home / Self Care | Payer: Medicare Other | Attending: Gastroenterology | Admitting: Gastroenterology

## 2018-12-10 ENCOUNTER — Other Ambulatory Visit: Payer: Self-pay

## 2018-12-10 ENCOUNTER — Encounter (HOSPITAL_COMMUNITY)
Admission: RE | Disposition: A | Discharge status: Home / Self Care | Payer: Self-pay | Source: Home / Self Care | Attending: Gastroenterology

## 2018-12-10 ENCOUNTER — Ambulatory Visit (HOSPITAL_COMMUNITY): Payer: Medicare Other | Admitting: Anesthesiology

## 2018-12-10 ENCOUNTER — Other Ambulatory Visit: Payer: Self-pay | Admitting: Gastroenterology

## 2018-12-10 ENCOUNTER — Encounter (HOSPITAL_COMMUNITY): Payer: Self-pay | Admitting: Critical Care Medicine

## 2018-12-10 ENCOUNTER — Ambulatory Visit (HOSPITAL_COMMUNITY): Payer: Medicare Other

## 2018-12-10 DIAGNOSIS — M419 Scoliosis, unspecified: Secondary | ICD-10-CM | POA: Insufficient documentation

## 2018-12-10 DIAGNOSIS — K838 Other specified diseases of biliary tract: Secondary | ICD-10-CM | POA: Diagnosis not present

## 2018-12-10 DIAGNOSIS — Z9049 Acquired absence of other specified parts of digestive tract: Secondary | ICD-10-CM | POA: Insufficient documentation

## 2018-12-10 DIAGNOSIS — K210 Gastro-esophageal reflux disease with esophagitis: Secondary | ICD-10-CM | POA: Insufficient documentation

## 2018-12-10 DIAGNOSIS — Z87891 Personal history of nicotine dependence: Secondary | ICD-10-CM | POA: Insufficient documentation

## 2018-12-10 DIAGNOSIS — R945 Abnormal results of liver function studies: Secondary | ICD-10-CM | POA: Diagnosis not present

## 2018-12-10 DIAGNOSIS — K2950 Unspecified chronic gastritis without bleeding: Secondary | ICD-10-CM | POA: Diagnosis present

## 2018-12-10 DIAGNOSIS — K317 Polyp of stomach and duodenum: Secondary | ICD-10-CM | POA: Insufficient documentation

## 2018-12-10 DIAGNOSIS — G4730 Sleep apnea, unspecified: Secondary | ICD-10-CM | POA: Diagnosis not present

## 2018-12-10 DIAGNOSIS — J449 Chronic obstructive pulmonary disease, unspecified: Secondary | ICD-10-CM | POA: Diagnosis not present

## 2018-12-10 DIAGNOSIS — K209 Esophagitis, unspecified: Secondary | ICD-10-CM | POA: Diagnosis not present

## 2018-12-10 DIAGNOSIS — Q445 Other congenital malformations of bile ducts: Secondary | ICD-10-CM | POA: Diagnosis not present

## 2018-12-10 DIAGNOSIS — K2970 Gastritis, unspecified, without bleeding: Secondary | ICD-10-CM | POA: Diagnosis not present

## 2018-12-10 DIAGNOSIS — K2990 Gastroduodenitis, unspecified, without bleeding: Principal | ICD-10-CM

## 2018-12-10 DIAGNOSIS — R569 Unspecified convulsions: Secondary | ICD-10-CM | POA: Diagnosis not present

## 2018-12-10 DIAGNOSIS — R932 Abnormal findings on diagnostic imaging of liver and biliary tract: Secondary | ICD-10-CM | POA: Diagnosis not present

## 2018-12-10 DIAGNOSIS — Z9981 Dependence on supplemental oxygen: Secondary | ICD-10-CM | POA: Diagnosis not present

## 2018-12-10 DIAGNOSIS — K3189 Other diseases of stomach and duodenum: Secondary | ICD-10-CM | POA: Diagnosis not present

## 2018-12-10 DIAGNOSIS — K831 Obstruction of bile duct: Secondary | ICD-10-CM

## 2018-12-10 DIAGNOSIS — K8050 Calculus of bile duct without cholangitis or cholecystitis without obstruction: Secondary | ICD-10-CM | POA: Insufficient documentation

## 2018-12-10 DIAGNOSIS — R7989 Other specified abnormal findings of blood chemistry: Secondary | ICD-10-CM

## 2018-12-10 HISTORY — DX: Other specified abnormal findings of blood chemistry: R79.89

## 2018-12-10 HISTORY — PX: REMOVAL OF STONES: SHX5545

## 2018-12-10 HISTORY — PX: SPYGLASS CHOLANGIOSCOPY: SHX5441

## 2018-12-10 HISTORY — DX: Headache, unspecified: R51.9

## 2018-12-10 HISTORY — PX: BILIARY STENT PLACEMENT: SHX5538

## 2018-12-10 HISTORY — DX: Presence of spectacles and contact lenses: Z97.3

## 2018-12-10 HISTORY — PX: BIOPSY: SHX5522

## 2018-12-10 HISTORY — DX: Headache: R51

## 2018-12-10 HISTORY — DX: Abnormal results of liver function studies: R94.5

## 2018-12-10 HISTORY — PX: ESOPHAGOGASTRODUODENOSCOPY (EGD) WITH PROPOFOL: SHX5813

## 2018-12-10 HISTORY — PX: SPHINCTEROTOMY: SHX5544

## 2018-12-10 HISTORY — PX: ENDOSCOPIC RETROGRADE CHOLANGIOPANCREATOGRAPHY (ERCP) WITH PROPOFOL: SHX5810

## 2018-12-10 SURGERY — ENDOSCOPIC RETROGRADE CHOLANGIOPANCREATOGRAPHY (ERCP) WITH PROPOFOL
Anesthesia: General

## 2018-12-10 MED ORDER — GLUCAGON HCL RDNA (DIAGNOSTIC) 1 MG IJ SOLR
INTRAMUSCULAR | Status: DC | PRN
  Administered 2018-12-10 (×4): 0.25 mg via INTRAVENOUS

## 2018-12-10 MED ORDER — CIPROFLOXACIN IN D5W 400 MG/200ML IV SOLN
INTRAVENOUS | Status: DC | PRN
  Administered 2018-12-10: 400 mg via INTRAVENOUS

## 2018-12-10 MED ORDER — FENTANYL CITRATE (PF) 250 MCG/5ML IJ SOLN
INTRAMUSCULAR | Status: DC | PRN
  Administered 2018-12-10 (×2): 50 ug via INTRAVENOUS

## 2018-12-10 MED ORDER — MEPERIDINE HCL 100 MG/ML IJ SOLN: 6.2500 mg | INTRAMUSCULAR | Status: DC | PRN

## 2018-12-10 MED ORDER — IOPAMIDOL (ISOVUE-300) INJECTION 61%
INTRAVENOUS | Status: DC | PRN
  Administered 2018-12-10: 40 mL via INTRAVENOUS

## 2018-12-10 MED ORDER — LACTATED RINGERS IV SOLN
INTRAVENOUS | Status: DC
  Administered 2018-12-10 (×2): via INTRAVENOUS

## 2018-12-10 MED ORDER — SODIUM CHLORIDE 0.9 % IV SOLN: INTRAVENOUS | Status: DC

## 2018-12-10 MED ORDER — DEXAMETHASONE SODIUM PHOSPHATE 10 MG/ML IJ SOLN
INTRAMUSCULAR | Status: DC | PRN
  Administered 2018-12-10: 4 mg via INTRAVENOUS

## 2018-12-10 MED ORDER — LIDOCAINE 2% (20 MG/ML) 5 ML SYRINGE
INTRAMUSCULAR | Status: DC | PRN
  Administered 2018-12-10: 40 mg via INTRAVENOUS

## 2018-12-10 MED ORDER — IOPAMIDOL (ISOVUE-300) INJECTION 61%
INTRAVENOUS | Status: AC
  Filled 2018-12-10: qty 100

## 2018-12-10 MED ORDER — MIDAZOLAM HCL 2 MG/2ML IJ SOLN: 0.5000 mg | Freq: Once | INTRAMUSCULAR | Status: DC | PRN

## 2018-12-10 MED ORDER — SODIUM CHLORIDE 0.9 % IV SOLN
INTRAVENOUS | Status: DC | PRN
  Administered 2018-12-10: 25 ug/min via INTRAVENOUS

## 2018-12-10 MED ORDER — ONDANSETRON HCL 4 MG/2ML IJ SOLN
INTRAMUSCULAR | Status: DC | PRN
  Administered 2018-12-10: 4 mg via INTRAVENOUS

## 2018-12-10 MED ORDER — CIPROFLOXACIN IN D5W 400 MG/200ML IV SOLN
INTRAVENOUS | Status: AC
  Filled 2018-12-10: qty 200

## 2018-12-10 MED ORDER — CIPROFLOXACIN HCL 500 MG PO TABS: 500.0000 mg | ORAL_TABLET | Freq: Two times a day (BID) | ORAL | 0 refills | Status: AC

## 2018-12-10 MED ORDER — INDOMETHACIN 50 MG RE SUPP
RECTAL | Status: DC | PRN
  Administered 2018-12-10: 100 mg via RECTAL

## 2018-12-10 MED ORDER — EPHEDRINE SULFATE-NACL 50-0.9 MG/10ML-% IV SOSY
PREFILLED_SYRINGE | INTRAVENOUS | Status: DC | PRN
  Administered 2018-12-10 (×3): 5 mg via INTRAVENOUS
  Administered 2018-12-10: 10 mg via INTRAVENOUS
  Administered 2018-12-10: 5 mg via INTRAVENOUS

## 2018-12-10 MED ORDER — INDOMETHACIN 50 MG RE SUPP
RECTAL | Status: AC
  Filled 2018-12-10: qty 2

## 2018-12-10 MED ORDER — SUCCINYLCHOLINE CHLORIDE 200 MG/10ML IV SOSY
PREFILLED_SYRINGE | INTRAVENOUS | Status: DC | PRN
  Administered 2018-12-10: 100 mg via INTRAVENOUS

## 2018-12-10 MED ORDER — PROPOFOL 10 MG/ML IV BOLUS
INTRAVENOUS | Status: DC | PRN
  Administered 2018-12-10: 100 mg via INTRAVENOUS
  Administered 2018-12-10: 40 mg via INTRAVENOUS
  Administered 2018-12-10: 20 mg via INTRAVENOUS
  Administered 2018-12-10: 30 mg via INTRAVENOUS

## 2018-12-10 MED ORDER — OMEPRAZOLE 40 MG PO CPDR: 40.0000 mg | DELAYED_RELEASE_CAPSULE | Freq: Every day | ORAL | 3 refills | Status: DC

## 2018-12-10 MED ORDER — PROMETHAZINE HCL 25 MG/ML IJ SOLN: 6.2500 mg | INTRAMUSCULAR | Status: DC | PRN

## 2018-12-10 MED ORDER — FENTANYL CITRATE (PF) 100 MCG/2ML IJ SOLN: 25.0000 ug | INTRAMUSCULAR | Status: DC | PRN

## 2018-12-10 MED ORDER — GLUCAGON HCL RDNA (DIAGNOSTIC) 1 MG IJ SOLR
INTRAMUSCULAR | Status: AC
  Filled 2018-12-10: qty 1

## 2018-12-10 NOTE — Anesthesia Postprocedure Evaluation (Signed)
Anesthesia Post Note  Patient: Samuel Olson  Procedure(s) Performed: ENDOSCOPIC RETROGRADE CHOLANGIOPANCREATOGRAPHY (ERCP) WITH PROPOFOL (N/A ) SPHINCTEROTOMY SPYGLASS CHOLANGIOSCOPY (N/A ) BIOPSY BILIARY STENT PLACEMENT ESOPHAGOGASTRODUODENOSCOPY (EGD) WITH PROPOFOL (N/A ) REMOVAL OF STONES     Patient location during evaluation: PACU Anesthesia Type: General Level of consciousness: awake and alert, patient cooperative and oriented Pain management: pain level controlled Vital Signs Assessment: post-procedure vital signs reviewed and stable Respiratory status: spontaneous breathing, nonlabored ventilation and respiratory function stable Cardiovascular status: blood pressure returned to baseline and stable Postop Assessment: no apparent nausea or vomiting Anesthetic complications: no    Last Vitals:  Vitals:   12/10/18 1115 12/10/18 1132  BP: 117/64 (!) 145/82  Pulse: (!) 56 (!) 56  Resp: 12 14  Temp: 36.5 C   SpO2: 92% 94%    Last Pain:  Vitals:   12/10/18 1132  TempSrc:   PainSc: 0-No pain                 Zondra Lawlor,E. Alexsandra Shontz

## 2018-12-10 NOTE — Interval H&P Note (Signed)
History and Physical Interval Note:  12/10/2018 7:33 AM  Samuel Olson  has presented today for surgery, with the diagnosis of CBD dilation.  The various methods of treatment have been discussed with the patient and family. After consideration of risks, benefits and other options for treatment, the patient has consented to  Procedure(s) with comments: ENDOSCOPIC RETROGRADE CHOLANGIOPANCREATOGRAPHY (ERCP) WITH PROPOFOL (N/A) - NEED  2 1/2 HOUR FOR CASE-JILL/PATTY as a surgical intervention.  The patient's history has been reviewed, patient examined, no change in status, stable for surgery.  I have reviewed the patient's chart and labs.  Questions were answered to the patient's satisfaction.    The risks of an ERCP were discussed at length, including but not limited to the risk of perforation, bleeding, abdominal pain, post-ERCP pancreatitis (while usually mild can be severe and even life threatening).    [employer]

## 2018-12-10 NOTE — Discharge Instructions (Signed)
YOU HAD AN ENDOSCOPIC PROCEDURE TODAY: Refer to the procedure report and other information in the discharge instructions given to you for any specific questions about what was found during the examination. If this information does not answer your questions, please call [place] office at [phone] to clarify.  ° °YOU SHOULD EXPECT: Some feelings of bloating in the abdomen. Passage of more gas than usual. Walking can help get rid of the air that was put into your GI tract during the procedure and reduce the bloating. If you had a lower endoscopy (such as a colonoscopy or flexible sigmoidoscopy) you may notice spotting of blood in your stool or on the toilet paper. Some abdominal soreness may be present for a day or two, also. ° °DIET: Your first meal following the procedure should be a light meal and then it is ok to progress to your normal diet. A half-sandwich or bowl of soup is an example of a good first meal. Heavy or fried foods are harder to digest and may make you feel nauseous or bloated. Drink plenty of fluids but you should avoid alcoholic beverages for 24 hours. If you had a esophageal dilation, please see attached instructions for diet.   ° °ACTIVITY: Your care partner should take you home directly after the procedure. You should plan to take it easy, moving slowly for the rest of the day. You can resume normal activity the day after the procedure however YOU SHOULD NOT DRIVE, use power tools, machinery or perform tasks that involve climbing or major physical exertion for 24 hours (because of the sedation medicines used during the test).  ° °SYMPTOMS TO REPORT IMMEDIATELY: °A gastroenterologist can be reached at any hour. Please call [phone]  for any of the following symptoms:  °Following lower endoscopy (colonoscopy, flexible sigmoidoscopy) °Excessive amounts of blood in the stool  °Significant tenderness, worsening of abdominal pains  °Swelling of the abdomen that is new, acute  °Fever of 100° or  higher  °Following upper endoscopy (EGD, EUS, ERCP, esophageal dilation) °Vomiting of blood or coffee ground material  °New, significant abdominal pain  °New, significant chest pain or pain under the shoulder blades  °Painful or persistently difficult swallowing  °New shortness of breath  °Black, tarry-looking or red, bloody stools ° °FOLLOW UP:  °If any biopsies were taken you will be contacted by phone or by letter within the next 1-3 weeks. Call [phone]  if you have not heard about the biopsies in 3 weeks.  °Please also call with any specific questions about appointments or follow up tests. ° °

## 2018-12-10 NOTE — Transfer of Care (Signed)
Immediate Anesthesia Transfer of Care Note  Patient: Samuel Olson  Procedure(s) Performed: ENDOSCOPIC RETROGRADE CHOLANGIOPANCREATOGRAPHY (ERCP) WITH PROPOFOL (N/A ) SPHINCTEROTOMY SPYGLASS CHOLANGIOSCOPY (N/A ) BIOPSY BILIARY STENT PLACEMENT  Patient Location: PACU  Anesthesia Type:General  Level of Consciousness: awake and alert   Airway & Oxygen Therapy: Patient Spontanous Breathing and Patient connected to nasal cannula oxygen  Post-op Assessment: Report given to RN and Post -op Vital signs reviewed and stable  Post vital signs: Reviewed and stable  Last Vitals:  Vitals Value Taken Time  BP 142/82 12/10/2018 10:13 AM  Temp 36.3 C 12/10/2018 10:13 AM  Pulse 62 12/10/2018 10:13 AM  Resp 12 12/10/2018 10:13 AM  SpO2 97 % 12/10/2018 10:13 AM    Last Pain:  Vitals:   12/10/18 0702  TempSrc: Oral  PainSc: 0-No pain         Complications: No apparent anesthesia complications

## 2018-12-10 NOTE — Op Note (Signed)
[facility] Patient Name: Samuel Olson Procedure Date : 12/10/2018 MRN: [id number] Attending MD: [name] , MD Date of Birth: July 07, 1941 CSN: [id number] Age: 78 Admit Type: Outpatient Procedure:                ERCP Indications:              Abnormal abdominal CT, Biliary dilation on Computed                            Tomogram Scan, Abnormal MRCP, Evaluation and                            possible treatment of bile duct stone(s), Abnormal                            liver function test Providers:                [name], MD, Burtis Junes, RN, Charolette Child, Technician Referring MD:             Elyn Aquas MD, MD Medicines:                General Anesthesia, Indomethacin 100 mg PR, Cipro                            111 mg IV Complications:            No immediate complications. Estimated Blood Loss:     Estimated blood loss was minimal. Procedure:                Pre-Anesthesia Assessment:                           - Prior to the procedure, a History and Physical                            was performed, and patient medications and                            allergies were reviewed. The patient's tolerance of                            previous anesthesia was also reviewed. The risks                            and benefits of the procedure and the sedation                            options and risks were discussed with the patient.                            All questions were answered, and informed consent  was obtained. Prior Anticoagulants: The patient has                            taken Celebrex (celecoxib). ASA Grade Assessment:                            III - A patient with severe systemic disease. After                            reviewing the risks and benefits, the patient was                            deemed in satisfactory condition to undergo the                            procedure.  Fluoroscopy images on Canopy.                           After obtaining informed consent, the scope was                            passed under direct vision. Throughout the                            procedure, the patient's blood pressure, pulse, and                            oxygen saturations were monitored continuously. The                            [id number]-H190 ([id number]) Olympus gastroscope was                            introduced through the mouth, and used to inject                            contrast into and used to inject contrast into the                            bile duct. The TJF-Q180V ([id number]) Olympus                            duodenoscope was introduced through the mouth, and                            used to inject contrast into and used for direct                            visualization of the bile duct. The patient                            tolerated the procedure. The ERCP was somewhat  difficult due to inadequate patient positioning.                            Successful completion of the procedure was aided by                            performing the maneuvers documented (below) in this                            report. Scope In: Scope Out: Findings:      A scout film of the abdomen was obtained. Surgical clips, consistent       with a previous cholecystectomy, were seen in the area of the right       upper quadrant of the abdomen.      The upper GI tract was traversed under direct vision without detailed       examination. No gross lesions were noted in the upper third of the       esophagus and in the middle third of the esophagus. LA Grade C (one or       more mucosal breaks continuous between tops of 2 or more mucosal folds,       less than 75% circumference) esophagitis with no bleeding was found in       the distal esophagus. Patchy moderately erythematous mucosa without       bleeding was found in the gastric body and in the  gastric antrum -       biopsied for H. pylori and histology evaluation. A single 3 mm sessile       polyp with no stigmata of recent bleeding was found in the gastric       fundus. This was removed with cold biopsy polypectomy for histology       purposes. No gross lesions were noted in the duodenal bulb, in the first       portion of the duodenum and in the second portion of the duodenum. The       major papilla was prominent and bulbous but otherwise was normal.      The bile duct could not be cannulated with initial attempts at wire       cannulation. Repeated attempts at biliary cannulation were not       successful while using a wire-guided approach, and this led to placement       of the wire in the pancreatic duct. After the 2nd time the wire inserted       into the pancreatic duct, decision was made to pursue a double-wire       approach. A short 0.035 inch Soft Jagwire was left in the ventral       pancreatic duct. After positioning in a semi-long position, a short       0.035 inch Soft Jagwire was passed into the biliary tree. The Autotome       sphincterotome was passed over the guidewire and the bile duct was then       deeply cannulated. Contrast was injected. I personally interpreted the       bile duct images. Ductal flow of contrast was adequate. Image quality       was adequate. Contrast extended to the hepatic ducts. Opacification of       the entire biliary tree except for the cystic duct  and gallbladder was       successful. The main bile duct was severely dilated. The largest       diameter was at least 20 mm. The upper third of the main bile       duct/common hepatic duct contained a large filling defect thought to be       a polypoid lesion vs a sludge-ball stone. An 8 mm biliary sphincterotomy       was made with a monofilament Autotome sphincterotome using ERBE       electrocautery. There was no post-sphincterotomy bleeding. To discover       objects, the biliary  tree was swept with a retrieval balloon starting at       the bifurcation. Small amount of sludge was swept from the duct. The       filling defect could not be moved up or down with the retrieval balloon.       This concerned me for the possibility of this being an intraductal       lesion/mass more than stone/sludge ball.      Decision was made to then explore the bile duct endoscopically using the       SpyGlass direct visualization system. Because of potential difficulty       with passage of the Spyscope with a pancreatic duct stent in place, I       pulled the wire since I only had the wire in the pancreatic duct on 2       attempts and since we had given Indomethacin and also fluids during the       procedure. The SpyScope was advanced to the bifurcation. Visibility with       the scope was good. The common hepatic duct contained a       frond-like/villous, pedunculated polyp with some sludge overlying it. We       lost position on 4 occasions due to the nate of the semi-long position       required to enter the bile duct. But we were able to then pursue biopsy       of the lesion with a SpyBite miniature biopsy forceps for histology.      One 10 Fr by 9 cm plastic biliary stent with a single external flap and       a single internal flap was placed into the common bile duct. Bile flowed       through the stent. The stent was in good position.      A pancreatogram was not performed.      The duodenoscope was withdrawn from the patient. Impression:               - LA Grade C distal esophagitis.                           - Erythematous mucosa in the gastric body and                            antrum. Biopsied for HP.                           - A single gastric polyp. Removed but  endoscopically appearing to be a fundic gland polyp.                           - No gross lesions in the duodenal bulb, in the                            first portion of the  duodenum and in the second                            portion of the duodenum.                           - The major papilla appeared to be prominent and                            bulging.                           - Double wire technique pursued for entrance into                            the biliary duct.                           - The entire main bile duct was severely dilated.                           - A biliary sphincterotomy was performed.                           - The fluoroscopic examination was suspicious for                            biliary polyp vs sludge-ball/stone. However,                            sweeping only yielded small amount of sludge and                            the filling defect did not move.                           - The biliary tree was swept and sludge was found.                           - A polypoid lesion with sludge overlying it was                            visualized via SpyGlass in the common hepatic duct.                            Biopsies were performed via SpyBite.                           -  One plastic biliary stent was placed into the                            common bile duct to traverse the region. Recommendation:           - The patient will be observed post-procedure,                            until all discharge criteria are met.                           - Discharge patient to home if no issues after                            evaluation and awakening in PACU.                           - Patient has a contact number available for                            emergencies. The signs and symptoms of potential                            delayed complications were discussed with the                            patient. Return to normal activities tomorrow.                            Written discharge instructions were provided to the                            patient.                           - Check liver enzymes (AST, ALT,  alkaline                            phosphatase, bilirubin) in 1 week.                           - Observe patient's clinical course.                           - Await path results.                           - Watch for pancreatitis, bleeding, perforation,                            and cholangitis.                           - Ciprfloxacin 500 mg BID x 5-days to decrease risk  of post-infectious ERCP complications after                            Spyglass.                           - The findings and recommendations were discussed                            with the patient.                           - The findings and recommendations were discussed                            with the patient's family. Procedure Code(s):        --- Professional ---                           [phone], Endoscopic retrograde                            cholangiopancreatography (ERCP); with placement of                            endoscopic stent into biliary or pancreatic duct,                            including pre- and post-dilation and guide wire                            passage, when performed, including sphincterotomy,                            when performed, each stent                           [id number], Endoscopic retrograde                            cholangiopancreatography (ERCP); with removal of                            calculi/debris from biliary/pancreatic duct(s)                           43273, Endoscopic cannulation of papilla with                            direct visualization of pancreatic/common bile                            duct(s) (List separately in addition to code(s) for                            primary procedure) Diagnosis Code(s):        --- Professional ---  K20.9, Esophagitis, unspecified                           K31.89, Other diseases of stomach and duodenum                           K31.7, Polyp of stomach and  duodenum                           K83.8, Other specified diseases of biliary tract                           K80.50, Calculus of bile duct without cholangitis                            or cholecystitis without obstruction                           R94.5, Abnormal results of liver function studies                           R93.5, Abnormal findings on diagnostic imaging of                            other abdominal regions, including retroperitoneum                           R93.2, Abnormal findings on diagnostic imaging of                            liver and biliary tract CPT copyright 2018 [employer]. All rights reserved. The codes documented in this report are preliminary and upon coder review may  be revised to meet current compliance requirements. [name], MD 12/10/2018 10:18:50 AM Number of Addenda: 0

## 2018-12-10 NOTE — Anesthesia Procedure Notes (Signed)
Procedure Name: Intubation Date/Time: 12/10/2018 7:54 AM Performed by: Wilburn Cornelia, CRNA Pre-anesthesia Checklist: Patient identified, Emergency Drugs available, Suction available, Patient being monitored and Timeout performed Patient Re-evaluated:Patient Re-evaluated prior to induction Oxygen Delivery Method: Circle system utilized Preoxygenation: Pre-oxygenation with 100% oxygen Induction Type: IV induction Ventilation: Mask ventilation without difficulty Grade View: Grade I Tube type: Oral Tube size: 7.5 mm Number of attempts: 1 Airway Equipment and Method: Stylet and Video-laryngoscopy Secured at: 23 cm Tube secured with: Tape Dental Injury: Teeth and Oropharynx as per pre-operative assessment  Comments: Elective glidescope utilized due to neck pain

## 2018-12-11 ENCOUNTER — Telehealth: Payer: Self-pay | Admitting: Gastroenterology

## 2018-12-11 NOTE — Telephone Encounter (Signed)
Called pharmacy, per [place] they did have rx for omeprazole but it was put on hold. They will fill rx. I called patient wife and informed her of this.

## 2018-12-11 NOTE — Telephone Encounter (Signed)
Pt's wife Pamala Hurry called to inform that [facility] did not receive prescription for prilosec. Pt had procedure with Dr. Rush Landmark yesterday at [facility]. Pls send med again to pharmacy.

## 2018-12-12 ENCOUNTER — Telehealth: Payer: Self-pay | Admitting: Gastroenterology

## 2018-12-12 ENCOUNTER — Encounter (HOSPITAL_COMMUNITY): Payer: Self-pay | Admitting: Gastroenterology

## 2018-12-12 ENCOUNTER — Encounter: Payer: Self-pay | Admitting: Gastroenterology

## 2018-12-12 ENCOUNTER — Other Ambulatory Visit: Payer: Self-pay

## 2018-12-12 DIAGNOSIS — K831 Obstruction of bile duct: Secondary | ICD-10-CM

## 2018-12-12 NOTE — Telephone Encounter (Signed)
I called and spoke with the patient's wife about the results of the biopsies. 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  This is very concerning. We will obtain a CT of the chest to complete staging. Previous imaging had not shown evidence of significant other disease outside of this polypoid lesion in the bile duct. We will plan to get him on for [place] discussion in the next 1 to 2 weeks. Maintain current stent in place for now and plan for follow-up ERCP to be dictated by patient's clinical history and trajectory. Patient's wife thankful for the care that we are providing for her husband and she will update him accordingly.  Samuel Britain, MD [facility] Office # [phone]

## 2018-12-18 ENCOUNTER — Ambulatory Visit (HOSPITAL_COMMUNITY)
Admission: RE | Admit: 2018-12-18 | Discharge: 2018-12-18 | Disposition: A | Discharge status: Home / Self Care | Payer: Medicare Other | Source: Ambulatory Visit | Attending: Gastroenterology | Admitting: Gastroenterology

## 2018-12-18 ENCOUNTER — Other Ambulatory Visit: Payer: Self-pay

## 2018-12-18 DIAGNOSIS — K831 Obstruction of bile duct: Secondary | ICD-10-CM | POA: Diagnosis present

## 2018-12-18 DIAGNOSIS — I2510 Atherosclerotic heart disease of native coronary artery without angina pectoris: Secondary | ICD-10-CM | POA: Diagnosis not present

## 2018-12-18 DIAGNOSIS — R918 Other nonspecific abnormal finding of lung field: Secondary | ICD-10-CM | POA: Insufficient documentation

## 2018-12-18 MED ORDER — IOHEXOL 300 MG/ML  SOLN
75.0000 mL | Freq: Once | INTRAMUSCULAR | Status: AC | PRN
  Administered 2018-12-18: 75 mL via INTRAVENOUS

## 2018-12-19 ENCOUNTER — Encounter: Payer: Self-pay | Admitting: Gastroenterology

## 2018-12-19 ENCOUNTER — Telehealth: Payer: Self-pay | Admitting: Gastroenterology

## 2018-12-19 NOTE — Telephone Encounter (Signed)
I called and spoke with the patient's wife. I presented the patient's case at [facility] today and reviewed radiology as well as with our oncologists. 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 Hepatobiliary surgery was unavailable at today's conference thus I will try and review this case with hepatobiliary when they return and see if they would consider the possible role of consultation and extrahepatic duct resection of this lesion. If not, then we will consider the role of referral to 1 of our quaternary centers around the region, my preference would be [facility] but can consider referral to [facility] or [facility] as well. The patient's wife was appreciative for the call back. I also briefly discussed the results of the CT chest and we will get a letter sent out to the patient's wife and patient so that they can have this for the records as well. She is going to touch base with the patient's primary care provider and see if we can get any other imaging studies that have been done of the chest to understand some of the findings that were present as the patient has a history of previous pneumonias the last one being in 2019. The patient's wife and patient are appreciative for our care.  [name], MD [facility] Office # [phone]

## 2018-12-23 ENCOUNTER — Telehealth: Payer: Self-pay | Admitting: Gastroenterology

## 2018-12-23 NOTE — Telephone Encounter (Signed)
I called the patient's wife on 3/19 to update her about the patient's case.  I reviewed the patient's case with our hepatobiliary surgeon here her and they felt that the patient would be optimally served due to the region/location of the however for a quaternary care center referral. I have reviewed this case with Dr. Cain Saupe and Dr. Colonel Bald at [facility] who are part of the hepatobiliary surgery group. They do think that a referral to discuss possible surgical intervention would be reasonable for this patient. With the current COVID-19 climate the patient will likely be awaiting an appointment for a few weeks time which I think is reasonable as he has a stent in place and is doing well. I will try to reach out to the patient's wife on 3/23 to discuss things further and see if they would like the referral to [facility] and we have already confirmed his insurance will be taken.  If they decide that they would like to have another referral to [facility] or [facility] then we will be happy to place that as necessary. Further recommendations and placement of will be made after discussion with the patient's wife.  [name], MD [facility] Office # [phone]

## 2018-12-24 NOTE — Telephone Encounter (Addendum)
I called and spoke with the patient's wife this morning.  I discussed my conversation with Dr. Colonel Bald and Dr. Lilian Coma Graciella Freer at [facility].  They are in agreement that they are willing to be seen at [facility] for consideration of surgical intervention moving forward.  They do understand that as a result of COVID-19 that timing of referral/appointment will be dictated in the coming weeks.  I think it is most reasonable that we do proceed in this fashion.  Patient's wife states that he is doing well with no evidence of jaundice or dark urine.  She expressed concern as Angelina Sheriff now has the first case of COVID-19 as to whether they should be doing anything more than staying at home and I expressed to them that that is the best case and scenario of things for now.  Go in for groceries and had home.  If any issues or concerns develop they know our number to reach out to [place].  Dr. Shayne Alken staff assistant is Allene Pyo and her phone is [phone]. Dr. Lytle Michaels office is [phone].  Please move forward with referral information. Thank you.

## 2018-12-24 NOTE — Telephone Encounter (Signed)
Records sent to Dr Colonel Bald' office at [phone]

## 2019-04-09 ENCOUNTER — Encounter (HOSPITAL_COMMUNITY): Payer: Self-pay | Admitting: Emergency Medicine

## 2019-04-09 ENCOUNTER — Other Ambulatory Visit: Payer: Self-pay

## 2019-04-09 ENCOUNTER — Emergency Department (HOSPITAL_COMMUNITY): Payer: Medicare Other

## 2019-04-09 ENCOUNTER — Emergency Department (HOSPITAL_COMMUNITY)
Admission: EM | Admit: 2019-04-09 | Discharge: 2019-04-10 | Disposition: A | Discharge status: Home / Self Care | Payer: Medicare Other | Attending: Emergency Medicine | Admitting: Emergency Medicine

## 2019-04-09 DIAGNOSIS — Y939 Activity, unspecified: Secondary | ICD-10-CM | POA: Insufficient documentation

## 2019-04-09 DIAGNOSIS — S22080A Wedge compression fracture of T11-T12 vertebra, initial encounter for closed fracture: Secondary | ICD-10-CM | POA: Insufficient documentation

## 2019-04-09 DIAGNOSIS — Z87891 Personal history of nicotine dependence: Secondary | ICD-10-CM | POA: Insufficient documentation

## 2019-04-09 DIAGNOSIS — J181 Lobar pneumonia, unspecified organism: Secondary | ICD-10-CM | POA: Insufficient documentation

## 2019-04-09 DIAGNOSIS — Y929 Unspecified place or not applicable: Secondary | ICD-10-CM | POA: Insufficient documentation

## 2019-04-09 DIAGNOSIS — J189 Pneumonia, unspecified organism: Secondary | ICD-10-CM

## 2019-04-09 DIAGNOSIS — Z79899 Other long term (current) drug therapy: Secondary | ICD-10-CM | POA: Insufficient documentation

## 2019-04-09 DIAGNOSIS — R109 Unspecified abdominal pain: Secondary | ICD-10-CM | POA: Diagnosis present

## 2019-04-09 DIAGNOSIS — X58XXXA Exposure to other specified factors, initial encounter: Secondary | ICD-10-CM | POA: Insufficient documentation

## 2019-04-09 DIAGNOSIS — Y999 Unspecified external cause status: Secondary | ICD-10-CM | POA: Diagnosis not present

## 2019-04-09 DIAGNOSIS — R1084 Generalized abdominal pain: Secondary | ICD-10-CM

## 2019-04-09 LAB — CBC WITH DIFFERENTIAL/PLATELET
Abs Immature Granulocytes: 0.03 10*3/uL (ref 0.00–0.07)
Basophils Absolute: 0 10*3/uL (ref 0.0–0.1)
Basophils Relative: 0 %
Eosinophils Absolute: 0.3 10*3/uL (ref 0.0–0.5)
Eosinophils Relative: 4 %
HCT: 37.2 % — ABNORMAL LOW (ref 39.0–52.0)
Hemoglobin: 11.8 g/dL — ABNORMAL LOW (ref 13.0–17.0)
Immature Granulocytes: 0 %
Lymphocytes Relative: 13 %
Lymphs Abs: 0.9 10*3/uL (ref 0.7–4.0)
MCH: 31.8 pg (ref 26.0–34.0)
MCHC: 31.7 g/dL (ref 30.0–36.0)
MCV: 100.3 fL — ABNORMAL HIGH (ref 80.0–100.0)
Monocytes Absolute: 0.5 10*3/uL (ref 0.1–1.0)
Monocytes Relative: 7 %
Neutro Abs: 5 10*3/uL (ref 1.7–7.7)
Neutrophils Relative %: 76 %
Platelets: 132 10*3/uL — ABNORMAL LOW (ref 150–400)
RBC: 3.71 MIL/uL — ABNORMAL LOW (ref 4.22–5.81)
RDW: 13 % (ref 11.5–15.5)
WBC: 6.7 10*3/uL (ref 4.0–10.5)
nRBC: 0 % (ref 0.0–0.2)

## 2019-04-09 LAB — COMPREHENSIVE METABOLIC PANEL
ALT: 20 U/L (ref 0–44)
AST: 19 U/L (ref 15–41)
Albumin: 3.9 g/dL (ref 3.5–5.0)
Alkaline Phosphatase: 77 U/L (ref 38–126)
Anion gap: 10 (ref 5–15)
BUN: 14 mg/dL (ref 8–23)
CO2: 29 mmol/L (ref 22–32)
Calcium: 9.2 mg/dL (ref 8.9–10.3)
Chloride: 101 mmol/L (ref 98–111)
Creatinine, Ser: 0.65 mg/dL (ref 0.61–1.24)
GFR calc Af Amer: >60 mL/min (ref >60)
GFR calc non Af Amer: >60 mL/min (ref >60)
Glucose, Bld: 100 mg/dL — ABNORMAL HIGH (ref 70–99)
Potassium: 4 mmol/L (ref 3.5–5.1)
Sodium: 140 mmol/L (ref 135–145)
Total Bilirubin: 0.5 mg/dL (ref 0.3–1.2)
Total Protein: 7.1 g/dL (ref 6.5–8.1)

## 2019-04-09 LAB — LIPASE, BLOOD: Lipase: 33 U/L (ref 11–51)

## 2019-04-09 MED ORDER — MAGNESIUM CITRATE PO SOLN
1.0000 | Freq: Once | ORAL | Status: AC
  Administered 2019-04-09: 1 via ORAL
  Filled 2019-04-09: qty 296

## 2019-04-09 MED ORDER — ONDANSETRON HCL 4 MG/2ML IJ SOLN
4.0000 mg | Freq: Once | INTRAMUSCULAR | Status: AC
  Administered 2019-04-09: 4 mg via INTRAVENOUS
  Filled 2019-04-09: qty 2

## 2019-04-09 MED ORDER — HYDROMORPHONE HCL 1 MG/ML IJ SOLN
1.0000 mg | Freq: Once | INTRAMUSCULAR | Status: AC
  Administered 2019-04-09: 1 mg via INTRAVENOUS
  Filled 2019-04-09: qty 1

## 2019-04-09 MED ORDER — MORPHINE SULFATE (PF) 4 MG/ML IV SOLN
4.0000 mg | Freq: Once | INTRAVENOUS | Status: AC
  Administered 2019-04-09: 23:00:00 4 mg via INTRAVENOUS
  Filled 2019-04-09: qty 1

## 2019-04-09 NOTE — ED Triage Notes (Signed)
Pt has not had a BM in 3 weeks.  Hx of GI problems.

## 2019-04-09 NOTE — ED Provider Notes (Signed)
[facility] EMERGENCY DEPARTMENT Provider Note   CSN: [id number] Arrival date & time: 04/09/19  1543    History   Chief Complaint Chief Complaint  Patient presents with  . Constipation    HPI Samuel Olson is a 78 y.o. male with a history as outlined below, most significant for recent diagnosis of a bile duct cancer who underwent surgical intervention 5 weeks ago at [facility] including a Roux-en-Y surgery and partial liver lobectomy including a hepatico jejunostomy presenting with persistent abdominal pain constipation.  He is on chronic Dilaudid for control of his postsurgical pain but also of his back pain which is secondary to scoliosis which is suspected to be causing constipation.  Patient's wife has been in contact with the surgeon at [facility] and at the recommendation she has been treating him with intermittent doses of magnesium citrate, he is also on MiraLAX and he has had multiple enemas which only result in liquid stools but no formed stool or relief of abdominal pain.  He has had a reduced appetite, has had no fevers or chills, no vomiting but does have some mild nausea.  He was seen at [facility] emergency department 1 week ago where he underwent CT imaging revealing for postsurgical changes but no significant other findings and was discharged to home with instructions for follow-up with his surgeon.  They have tried to cut back on his pain medication substituting Tylenol to try to alleviate constipation.  He denies rectal pain or pressure, does not feel impacted.       The history is provided by the patient.    Past Medical History:  Diagnosis Date  . Anxiety   . Arthritis   . 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 Complication of anesthesia   . Elevated LFTs    CBD dilated  . GERD (gastroesophageal reflux disease)   . Headache   . IBS (irritable bowel syndrome)   . On home oxygen therapy    "2L when I sleep" (04/17/2017)  . Pneumonia   . PONV (postoperative nausea and vomiting)    . Scoliosis   . Seizures ([place])   . Seizures ([place])    " Haven't had one since 1976"  per spouse.  . Sleep apnea    Uses O2 at 2L while sleeping; "can't tolerate CPAP OR BIPAP" * (04/17/2017)  . Wears glasses     Patient Active Problem List   Diagnosis Date Noted  . Biliary obstruction 11/30/2018  . Choledocholithiasis 11/30/2018  . Deep vein thrombosis (DVT) of right lower extremity ([place]) 11/30/2018  . Seizures ([place]) 11/29/2018  . Anxiety 11/29/2018  . Elevated LFTs 11/29/2018  . 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 BACK SURGERY    . BILIARY STENT PLACEMENT  12/10/2018   Procedure: BILIARY STENT PLACEMENT;  Surgeon: Irving Copas., MD;  Location: [place];  Service: Gastroenterology;;  . BIOPSY  12/10/2018   Procedure: BIOPSY;  Surgeon: Irving Copas., MD;  Location: [place];  Service: Gastroenterology;;  . CARDIAC CATHETERIZATION    . CHOLECYSTECTOMY    . COLONOSCOPY    . ENDOSCOPIC RETROGRADE CHOLANGIOPANCREATOGRAPHY (ERCP) WITH PROPOFOL N/A 12/10/2018   Procedure: ENDOSCOPIC RETROGRADE CHOLANGIOPANCREATOGRAPHY (ERCP) WITH PROPOFOL;  Surgeon: Rush Landmark Telford Nab., MD;  Location: [place];  Service: Gastroenterology;  Laterality: N/A;  NEED  2 1/2 HOUR FOR CASE-JILL/PATTY  . ESOPHAGOGASTRODUODENOSCOPY (EGD) WITH PROPOFOL N/A 12/10/2018   Procedure: ESOPHAGOGASTRODUODENOSCOPY (EGD) WITH PROPOFOL;  Surgeon: Irving Copas., MD;  Location: [place];  Service: Gastroenterology;  Laterality: N/A;  . FRACTURE SURGERY     Left plates and screws  . pain stimulator   2012  . pain stimulator   2012   removed 2012  . PARTIAL KNEE ARTHROPLASTY Right 04/17/2017   Procedure: UNICOMPARTMENTAL KNEE;  Surgeon: Vickey Huger, MD;  Location: [place];  Service: Orthopedics;  Laterality: Right;  . REMOVAL OF STONES  12/10/2018   Procedure: REMOVAL OF STONES;  Surgeon: Rush Landmark Telford Nab., MD;  Location: [place];  Service: Gastroenterology;;  . REPLACEMENT UNICONDYLAR JOINT KNEE Right 04/17/2017  . SPHINCTEROTOMY  12/10/2018   Procedure: SPHINCTEROTOMY;  Surgeon: Mansouraty, Telford Nab., MD;  Location: [place];  Service: Gastroenterology;;  . Bess Kinds CHOLANGIOSCOPY N/A 12/10/2018   Procedure: MGQQPYPP CHOLANGIOSCOPY;  Surgeon: Irving Copas., MD;  Location: [place];  Service: Gastroenterology;  Laterality: N/A;  . TONSILLECTOMY    . UVULOPALATOPHARYNGOPLASTY (UPPP)/TONSILLECTOMY/SEPTOPLASTY    . VASECTOMY          Home Medications    Prior to Admission medications   Medication Sig Start Date End Date Taking? Authorizing Provider  acetaminophen (TYLENOL) 500 MG tablet Take 1,000 mg by mouth every 6 (six) hours as needed for mild pain or headache.   Yes [provider]  baclofen (LIORESAL) 20 MG tablet Take 20 mg by mouth 2 (two) times daily. *May be given up to three times daily   Yes [provider]  busPIRone (BUSPAR) 10 MG tablet Take 10 mg by mouth 2 (two) times daily.  03/17/17  Yes [provider]  Calcium Carbonate-Vit D-Min (CALCIUM 1200 PO) Take 1 tablet by mouth 2 (two) times a day.   Yes [provider]  celecoxib (CELEBREX) 200 MG capsule Take 200 mg by mouth at bedtime.  03/20/17  Yes [provider]  cetirizine (ZYRTEC) 10 MG tablet Take 10 mg by mouth at bedtime.  09/27/18  Yes [provider]  Cholecalciferol (VITAMIN D3 PO) Take 1,500 Units by mouth every morning.    Yes [provider]  famotidine (PEPCID) 40 MG tablet Take 40 mg by mouth 2 (two) times daily. Taking once daily (40mg )   Yes [provider]  FISH OIL-VITAMIN D PO Take 1,400 mg by mouth 2 (two) times a day.   Yes [provider]  fluticasone (FLONASE) 50 MCG/ACT nasal spray Place 2 sprays into both nostrils at bedtime.   Yes [provider]  Glucosamine-Chondroit-Vit C-Mn  (GLUCOSAMINE 1500 COMPLEX PO) Take 1 capsule by mouth 2 (two) times daily.   Yes [provider]  HYDROmorphone (DILAUDID) 4 MG tablet Take 2-4 mg by mouth daily as needed for moderate pain.  03/16/19  Yes [provider]  magnesium hydroxide (MILK OF MAGNESIA) 400 MG/5ML suspension Take 30 mLs by mouth daily. *2 cups daily administered   Yes [provider]  Multiple Vitamin (MULTIVITAMIN WITH MINERALS) TABS tablet Take 1 tablet by mouth daily.   Yes [provider]  Multiple Vitamins-Minerals (PRESERVISION AREDS PO) Take 1 tablet by mouth 2 (two) times daily.   Yes [provider]  ondansetron (ZOFRAN) 4 MG tablet Take 4 mg by mouth 3 (three) times daily.   Yes [provider]  OXYGEN Inhale 2 L into the lungs at bedtime.   Yes [provider]  PHENobarbital (LUMINAL) 64.8 MG tablet Take 64.8 mg by mouth daily with supper.  02/27/17  Yes [provider]  polycarbophil (  FIBERCON) 625 MG tablet Take 1,400 mg by mouth 2 (two) times daily.    Yes [provider]  polyethylene glycol (CLEARLAX) packet Take 17 g by mouth daily.    Yes [provider]  senna (SENOKOT) 8.6 MG tablet Take 1 tablet by mouth 2 (two) times daily.   Yes [provider]  simvastatin (ZOCOR) 40 MG tablet Take 40 mg by mouth at bedtime. for cholesterol 02/25/17  Yes [provider]  Sodium Phosphates (ENEMA RE) Place 1 enema rectally once as needed (for constipation).   Yes [provider]  SUPER B COMPLEX/C PO Take 1 tablet by mouth every morning.    Yes [provider]  traZODone (DESYREL) 50 MG tablet Take 50 mg by mouth at bedtime.   Yes [provider]  levofloxacin (LEVAQUIN) 750 MG tablet Take 1 tablet (750 mg total) by mouth daily. 04/10/19   Evalee [place], PA-C  omeprazole (PRILOSEC) 40 MG capsule Take 1 capsule (40 mg total) by mouth daily for 30 days. 12/10/18 01/09/19  Mansouraty, Telford Nab., MD     Family History No family history on file.  Social History Social History   Tobacco Use  . Smoking status: Former Smoker    Types: Cigarettes  . Smokeless tobacco: Never Used  . Tobacco comment: quit smoking cigarettes in 1966  Substance Use Topics  . Alcohol use: No  . Drug use: No     Allergies   Promethazine, Augmentin [amoxicillin-pot clavulanate], and Oxycodone   Review of Systems Review of Systems  Constitutional: Positive for appetite change. Negative for chills and fever.  HENT: Negative for congestion and sore throat.   Eyes: Negative.   Respiratory: Negative for chest tightness and shortness of breath.   Cardiovascular: Negative for chest pain.  Gastrointestinal: Positive for abdominal pain, constipation and nausea. Negative for vomiting.  Genitourinary: Negative.   Musculoskeletal: Negative for arthralgias, joint swelling and neck pain.  Skin: Negative.  Negative for rash and wound.  Neurological: Negative for dizziness, weakness, light-headedness, numbness and headaches.  Psychiatric/Behavioral: Negative.      Physical Exam Updated Vital Signs BP (!) 161/99 (BP Location: Right Arm)   Pulse 77   Temp (!) 96.9 F (36.1 C)   Resp 19   Ht 5\' 10"  (1.778 m)   Wt 73.9 kg   SpO2 97%   BMI 23.39 kg/m   Physical Exam Vitals signs and nursing note reviewed.  Constitutional:      Appearance: He is well-developed.  HENT:     Head: Normocephalic and atraumatic.  Eyes:     Conjunctiva/sclera: Conjunctivae normal.  Neck:     Musculoskeletal: Normal range of motion.  Cardiovascular:     Rate and Rhythm: Normal rate and regular rhythm.     Heart sounds: Normal heart sounds.  Pulmonary:     Effort: Pulmonary effort is normal.     Breath sounds: Normal breath sounds. No wheezing.  Abdominal:     General: Bowel sounds are normal. There is distension.     Palpations: Abdomen is soft. There is no mass.     Tenderness: There is abdominal tenderness. There  is no guarding.     Comments: Well-healing horizontal surgical incision in upper abdomen.  Musculoskeletal: Normal range of motion.  Skin:    General: Skin is warm and dry.  Neurological:     Mental Status: He is alert.      ED Treatments / Results  Labs (all labs ordered are listed,  but only abnormal results are displayed) Labs Reviewed  CBC WITH DIFFERENTIAL/PLATELET - Abnormal; Notable for the following components:      Result Value   RBC 3.71 (*)    Hemoglobin 11.8 (*)    HCT 37.2 (*)    MCV 100.3 (*)    Platelets 132 (*)    All other components within normal limits  COMPREHENSIVE METABOLIC PANEL - Abnormal; Notable for the following components:   Glucose, Bld 100 (*)    All other components within normal limits  LIPASE, BLOOD    EKG None  Radiology Ct Abdomen Pelvis W Contrast  Result Date: 04/10/2019 CLINICAL DATA:  Abdominal distension. Patient reports no bowel movement for 3 weeks with abdominal pain. Patient reports bile duct surgery 03/04/2019. EXAM: CT ABDOMEN AND PELVIS WITH CONTRAST TECHNIQUE: Multidetector CT imaging of the abdomen and pelvis was performed using the standard protocol following bolus administration of intravenous contrast. CONTRAST:  141mL OMNIPAQUE IOHEXOL 300 MG/ML  SOLN COMPARISON:  MRCP 11/30/2018, abdominal CT 11/29/2018. Chest CT 12/18/2018 FINDINGS: Lower chest: Progressive medial right middle lobe consolidation with bronchiectasis since March chest CT. 7 mm right lower lobe pulmonary nodule is unchanged. Patchy peribronchovascular opacities in the dependent right lower lobe, nonspecific. Hepatobiliary: Surgical clips in the central liver appears centered on intrahepatic bile ducts, primarily in the left lobe. There is mild left intrahepatic biliary ductal dilatation. There is ill-defined low-density in the central left lobe of the liver adjacent to the falciform ligament is not seen on prior exam, this appears contiguous with the stomach and may  be postsurgical, but nonspecific. Focal biliary dilatation in the right lobe measures 2.3 x 1.6 cm. There is a 3.2 x 3.6 cm fluid density structure adjacent to surgical clips in the left lobe, new in also nonspecific. Gallbladder is surgically absent. Pancreas: Pancreatic divisum with dilated pancreatic duct. Pancreatic duct measures 6 mm, the degree of ductal dilatation has not significantly changed from prior. No peripancreatic edema. Spleen: Normal in size without focal abnormality. Adrenals/Urinary Tract: 2.4 cm right adrenal adenoma is unchanged from prior. No left adrenal nodule. No hydronephrosis or perinephric edema. Homogeneous renal enhancement with symmetric excretion on delayed phase imaging. Lobulated renal contours. Urinary bladder is distended without wall thickening. Stomach/Bowel: Small hiatal hernia. Stomach is nondistended. Small bowel are nondilated. Few fluid-filled distal small bowel loops. Liquid stool distends the entire colon with scattered air-fluid levels. There is colonic redundancy. No pericolonic edema or wall thickening. No significant rectal distention. Appendix not definitively visualized. Vascular/Lymphatic: Previous filling defect in the right lower extremity veins no longer seen. Aortic atherosclerosis without aneurysm. Portal vein is patent. Splenic vein is patent. Mesenteric vessels appear patent. Reproductive: Prominent prostate gland spans 5 cm. Other: No free fluid. No free air. Musculoskeletal: Scoliosis with postsurgical and degenerative change in the spine. Chronic L2 compression fracture. Mild T12 superior endplate compression fracture is new from March chest CT and may be acute. Presumed bone graft donor site in the right iliac crest. IMPRESSION: 1. Liquid stool distends the entire colon with scattered air-fluid levels, suggesting diarrheal process or slow transit. No significant formed stool. No obstruction. 2. Post bile duct surgery with multiple surgical clips in the  liver. Previous filling defect in the common hepatic duct is no longer seen. Focal dilatation of the right intrahepatic duct measuring 2.3 x 1.6 cm is new. There is an indeterminate 3.2 x 3.6 cm fluid density structure adjacent to surgical clips in the left lobe, that may represent focal biliary  dilatation, but connection to the biliary tree is not well-defined on CT. No surrounding inflammatory. Questionable biliary enteric anastomosis between the stomach, recommend correlation with operative report. 3. Chronic dilated pancreatic duct, unchanged from prior. 4. Right adrenal adenoma, unchanged. 5. Mild T12 superior endplate compression fracture is new from March chest CT and may be acute. 6. Progressive medial right middle lobe consolidation with bronchiectasis since March chest CT. Patchy peribronchovascular opacities in the dependent right lower lobe. Infection or aspiration are considered. 7. Additional chronic findings as detailed above. 8.  Aortic Atherosclerosis (ICD10-I70.0). Electronically Signed   By: Keith Rake M.D.   On: 04/10/2019 01:00   Dg Abd 2 Views  Result Date: 04/09/2019 CLINICAL DATA:  Constipation EXAM: ABDOMEN - 2 VIEW COMPARISON:  11/29/2018 FINDINGS: The bowel gas pattern is nonspecific with gaseous distention of loops of colon and small bowel scattered throughout the abdomen. The loops of small bowel measure up to approximately 3.5 cm in diameter. The stool burden appears average. There is a relative paucity of bowel gas at the level of the rectum. There are advanced degenerative changes throughout the lumbar spine. The patient is status post posterior fusion of the lower lumbar spine. There are mild degenerative changes of the hips bilaterally. Multiple small airspace opacities project over the right lower lobe. IMPRESSION: 1. Nonspecific bowel gas pattern with gaseous distention of loops of colon and small bowel scattered throughout the abdomen. 2. Airspace opacities overlying the  right lower lobe of unknown clinical significance. Follow-up with a dedicated chest radiograph is recommended. 3. Average stool burden. Electronically Signed   By: Constance Holster M.D.   On: 04/09/2019 21:47   Procedures Procedures (including critical care time)  Medications Ordered in ED Medications  levofloxacin (LEVAQUIN) tablet 750 mg (has no administration in time range)  morphine 4 MG/ML injection 4 mg (4 mg Intravenous Given 04/09/19 2255)  ondansetron (ZOFRAN) injection 4 mg (4 mg Intravenous Given 04/09/19 2255)  magnesium citrate solution 1 Bottle (1 Bottle Oral Given 04/09/19 2256)  HYDROmorphone (DILAUDID) injection 1 mg (1 mg Intravenous Given 04/09/19 2318)  iohexol (OMNIPAQUE) 300 MG/ML solution 100 mL (100 mLs Intravenous Contrast Given 04/10/19 0015)     Initial Impression / Assessment and Plan / ED Course  I have reviewed the triage vital signs and the nursing notes.  Pertinent labs & imaging results that were available during my care of the patient were reviewed by me and considered in my medical decision making (see chart for details).        Labs and imaging reviewed and discussed with patient.  He does have a T12 compression fracture which is mild and appears new.  He reports chronic back pain due to scoliosis, but has had increased pain, denies injury.  Patient has no intestinal obstructions or constipation, rather he has liquid stools in his colon, probably due to the aggressive measures they have taken for treating his "constipation".  There is appearance of infection in his right lower lobe, although he has no complaints of shortness of breath, cough or fever.  He was started on Levaquin and and was advised to follow-up with his general surgeon this week for an office visit.  Also discussed follow-up with his primary care doctor if he continues to have problems with his back pain.  He was given reassurance that this fracture is mild and should heal without any need of  intervention.  Final Clinical Impressions(s) / ED Diagnoses   Final diagnoses:  Generalized abdominal  pain  Community acquired pneumonia of right lower lobe of lung ([place])  Compression fracture of T12 vertebra, initial encounter [facility])    ED Discharge Orders         Ordered    levofloxacin (LEVAQUIN) 750 MG tablet  Daily     04/10/19 0147           Evalee [place], PA-C 04/10/19 0203    Maudie Flakes, MD 04/15/19 [phone]

## 2019-04-10 ENCOUNTER — Emergency Department (HOSPITAL_COMMUNITY): Payer: Medicare Other

## 2019-04-10 ENCOUNTER — Encounter (HOSPITAL_COMMUNITY): Payer: Self-pay

## 2019-04-10 MED ORDER — IOHEXOL 300 MG/ML  SOLN
100.0000 mL | Freq: Once | INTRAMUSCULAR | Status: AC | PRN
  Administered 2019-04-10: 100 mL via INTRAVENOUS

## 2019-04-10 MED ORDER — HYDROCODONE-ACETAMINOPHEN 5-325 MG PO TABS: 1.0000 | ORAL_TABLET | ORAL | 0 refills | Status: DC | PRN

## 2019-04-10 MED ORDER — HYDROCODONE-ACETAMINOPHEN 5-325 MG PO TABS: 2.0000 | ORAL_TABLET | ORAL | 0 refills | Status: DC | PRN

## 2019-04-10 MED ORDER — HYDROCODONE-ACETAMINOPHEN 5-325 MG PO TABS
1.0000 | ORAL_TABLET | Freq: Once | ORAL | Status: AC
  Administered 2019-04-10: 1 via ORAL
  Filled 2019-04-10: qty 1

## 2019-04-10 MED ORDER — LEVOFLOXACIN 750 MG PO TABS: 750.0000 mg | ORAL_TABLET | Freq: Every day | ORAL | 0 refills | Status: DC

## 2019-04-10 MED ORDER — LEVOFLOXACIN 750 MG PO TABS
750.0000 mg | ORAL_TABLET | Freq: Once | ORAL | Status: AC
  Administered 2019-04-10: 03:00:00 750 mg via ORAL
  Filled 2019-04-10: qty 1

## 2019-04-10 NOTE — ED Notes (Signed)
Pt unset about being discharged and not admitted to the hospital for pain. Pt kept saying "Yall haven;t done nothing for me", explained to patient that he had been given several doses of pain medication and multiple tests had been completed. Pt request to speak with the doctor. Almyra Free PA notified and currently at pts bedside speaking with him.

## 2019-04-10 NOTE — Discharge Instructions (Addendum)
Your lab tests are reassuring tonight and your xrays and CT scan shows no signs of any new complications from your surgery.  You have a lot of liquid stool in your colon but no formed stool and no sign of constipation, blockage or impaction.  There is a subtle hint of infection in your right lower lung field so are being treated with antibiotics.  You were given your first dose here, take your next tablet Wednesday evening - you will take this medicine for 5 days.  Plan to see your surgeon in follow up this week for a recheck of your symptoms.  Additionally you have a mild thoracic vertebral compression fracture which appears to be new so may be the source of your back pain. This does not require any treatments and will heal.  However, if it continues to be painful, you should see your primary doctor for further treatment options.  You can take the hydrocodone prescribed for this pain.  Stop taking your miralax until you start to have a formed stool as there is currently nothing but liquid stool in your colon.  When it starts to form up, start back on the miralax to avoid the hydrocodone from causing constipation once again.

## 2019-04-11 MED FILL — Hydrocodone-Acetaminophen Tab 5-325 MG: ORAL | Qty: 6 | Status: AC

## 2019-06-18 ENCOUNTER — Telehealth: Payer: Self-pay | Admitting: Gastroenterology

## 2019-06-18 NOTE — Telephone Encounter (Signed)
The pt's wife states that the pt has some lower left abd discomfort that comes and goes.  Has a hx of IBS and biliary obstruction.  He was seen at [facility] and has done well until now.  The pt's wife states that the pt is stressing and very anxious.  She also states the pt has a long history of back pain.  He was seen by PCP and was prescribed dicyclomine and she says that it helps sometimes and sometimes it does not.  Appt was made to see Dr Rush Landmark on 9/22.

## 2019-06-18 NOTE — Telephone Encounter (Signed)
Pls call pt's wife, she states that pt keeps having the same pain on the left side of his abd despite surgery. She is not sure of who pt should follow up with. She would like some guidance from Dr. Rush Landmark.

## 2019-06-25 ENCOUNTER — Encounter: Payer: Self-pay | Admitting: Gastroenterology

## 2019-06-25 ENCOUNTER — Other Ambulatory Visit: Payer: Self-pay

## 2019-06-25 ENCOUNTER — Telehealth: Payer: Self-pay | Admitting: Gastroenterology

## 2019-06-25 ENCOUNTER — Other Ambulatory Visit (INDEPENDENT_AMBULATORY_CARE_PROVIDER_SITE_OTHER): Payer: Medicare Other

## 2019-06-25 ENCOUNTER — Ambulatory Visit (INDEPENDENT_AMBULATORY_CARE_PROVIDER_SITE_OTHER)
Admission: RE | Admit: 2019-06-25 | Discharge: 2019-06-25 | Disposition: A | Discharge status: Home / Self Care | Payer: Medicare Other | Source: Ambulatory Visit | Attending: Gastroenterology | Admitting: Gastroenterology

## 2019-06-25 ENCOUNTER — Ambulatory Visit: Payer: Medicare Other | Admitting: Gastroenterology

## 2019-06-25 VITALS — BP 126/80 | HR 72 | Temp 97.9°F | Ht 67.0 in | Wt 162.4 lb

## 2019-06-25 DIAGNOSIS — R1084 Generalized abdominal pain: Secondary | ICD-10-CM | POA: Diagnosis not present

## 2019-06-25 DIAGNOSIS — R109 Unspecified abdominal pain: Secondary | ICD-10-CM | POA: Diagnosis not present

## 2019-06-25 DIAGNOSIS — Z9889 Other specified postprocedural states: Secondary | ICD-10-CM

## 2019-06-25 DIAGNOSIS — Z860100 Personal history of colon polyps, unspecified: Secondary | ICD-10-CM

## 2019-06-25 DIAGNOSIS — C249 Malignant neoplasm of biliary tract, unspecified: Secondary | ICD-10-CM | POA: Diagnosis not present

## 2019-06-25 LAB — COMPREHENSIVE METABOLIC PANEL
ALT: 21 U/L (ref 0–53)
AST: 26 U/L (ref 0–37)
Albumin: 4.4 g/dL (ref 3.5–5.2)
Alkaline Phosphatase: 104 U/L (ref 39–117)
BUN: 17 mg/dL (ref 6–23)
CO2: 34 mEq/L — ABNORMAL HIGH (ref 19–32)
Calcium: 10.3 mg/dL (ref 8.4–10.5)
Chloride: 100 mEq/L (ref 96–112)
Creatinine, Ser: 0.8 mg/dL (ref 0.40–1.50)
GFR: 93.29 mL/min (ref >60.00)
Glucose, Bld: 82 mg/dL (ref 70–99)
Potassium: 4 mEq/L (ref 3.5–5.1)
Sodium: 140 mEq/L (ref 135–145)
Total Bilirubin: 0.3 mg/dL (ref 0.2–1.2)
Total Protein: 7.6 g/dL (ref 6.0–8.3)

## 2019-06-25 LAB — CBC
HCT: 40 % (ref 39.0–52.0)
Hemoglobin: 13.3 g/dL (ref 13.0–17.0)
MCHC: 33.3 g/dL (ref 30.0–36.0)
MCV: 93 fl (ref 78.0–100.0)
Platelets: 198 10*3/uL (ref 150.0–400.0)
RBC: 4.29 Mil/uL (ref 4.22–5.81)
RDW: 14.2 % (ref 11.5–15.5)
WBC: 5.3 10*3/uL (ref 4.0–10.5)

## 2019-06-25 LAB — IGA: IgA: 416 mg/dL — ABNORMAL HIGH (ref 68–378)

## 2019-06-25 MED ORDER — OMEPRAZOLE 20 MG PO CPDR: 20.0000 mg | DELAYED_RELEASE_CAPSULE | Freq: Every day | ORAL | 3 refills | Status: DC

## 2019-06-25 MED ORDER — ESOMEPRAZOLE MAGNESIUM 20 MG PO CPDR: 20.0000 mg | DELAYED_RELEASE_CAPSULE | Freq: Every day | ORAL | 3 refills | Status: DC

## 2019-06-25 NOTE — Progress Notes (Signed)
GASTROENTEROLOGY OUTPATIENT CLINIC VISIT   Primary Care Provider Elyn Aquas [address] [place] [address] [phone]  Patient Profile: Samuel Olson is a 78 y.o. 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  The patient presents to the [facility] for an evaluation and management of problem(s) noted below:  Problem List 1. 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 Status post biliary surgery   3. Abdominal pain, unspecified abdominal location   4. Abdominal cramping     History of Present Illness This is a patient that I met in the spring of this year when he presented as an outside hospital transfer with abnormal liver tests and abnormal imaging concerning for possible choledocholithiasis.  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  He was referred to [place] hepatobiliary surgery and saw Dr. Colonel Bald.  Dr. Colonel Bald ended up moving forward with a biliary duct resection and hepaticojejunostomy anastomosis.  After his surgeries he had some issues including acute renal insufficiency requiring CRRT for a period in time.  He improved and was able to be discharged.  He dealt with lots of issues with constipation as he had longstanding back pain issues and was on narcotics.  He underwent repeat CT scans with the last one being done near [place] in July.  He has not followed up with hepatobiliary surgery since July via telehealth visit. Today, the patient presents with his wife.   Overall his abdominal pain issues have improved.  He was experiencing significant cramping throughout his abdomen at times.  This was more associated with issues of constipation but now his bowel habits have returned to their more normal baseline of a daily or every other day basis.  He continues to have significant back pains and was found on some imaging to have a compression fracture.  Eventually underwent kyphoplasty with significant improvement in his back pain.  This was performed in [place] the kyphoplasty.  The patient has had no recent liver tests checked over the course the last few months.  The patient denies any issues with jaundice, scleral icterus, pruritus, darkened/amber urine, clay-colored stools, LE edema, hematemesis, coffee-ground emesis, abdominal distention, confusion, new generalized pruritus.  The patient has been doing some woodworking and some wood staining and he had some mild chest congestion but no increased amount of sputum production.  This occurred in the most recent few days.  This is slightly improved from a few days ago.  The patient is currently stating that he has no abdominal pain or any nausea or vomiting.  Weight has stabilized.  No changes in bowel habits currently.  No hematochezia or melena.  He continues to have GERD symptoms as well as dyspepsia at times and continues to take famotidine twice daily.  He is not on a PPI.  GI Review of Systems Positive as above Negative for dysphagia, odynophagia  Pyrosis; Reflux; Regurgitation; Dysphagia; Odynophagia; Globus; Post-prandial cough; Nocturnal cough; Nasal regurgitation; Epigastric pain; Nausea; Vomiting; Hematemesis; Jaundice; Change in Appetite; Early satiety; Abdominal pain; Abdominal bloating; Eructation; Flatulence; Change in BM Frequency; Change in BM Consistency; Constipation; Diarrhea;  Incontinence; Urgency; Tenesmus; Hematochezia; Melena  Review of Systems General: Denies fevers/chills HEENT:  Denies oral lesions/sore throat Cardiovascular: Denies chest pain/palpitations Pulmonary: Denies shortness of breath/new cough Gastroenterological: See HPI Genitourinary: Denies darkened urine Hematological: Denies easy bruising/bleeding Endocrine: Denies temperature intolerance Dermatological: Denies jaundice Psychological: Has been dealing with some anxiety and close to panic attacks recently Musculoskeletal: Has daily issues with arthritis as result of his scoliosis but not significantly different than normal   Medications Current Outpatient Medications  Medication Sig Dispense Refill  . acetaminophen (TYLENOL) 500 MG tablet Take 1,000 mg by mouth every 6 (six) hours as needed for mild pain or headache.    . baclofen (LIORESAL) 20 MG tablet Take 20 mg by mouth 2 (two) times daily. *May be given up to three times daily    . busPIRone (BUSPAR) 10 MG tablet Take 10 mg by mouth 2 (two) times daily.   3  . CALCIUM CITRATE PO Take 1-2 tablets by mouth daily.    . celecoxib (CELEBREX) 200 MG capsule Take 200 mg by mouth at bedtime.   12  . cetirizine (ZYRTEC) 10 MG tablet Take 10 mg by mouth at bedtime.     . Cholecalciferol (VITAMIN D3 PO) Take 1,500 Units by mouth every morning.     . ciclopirox (LOPROX) 0.77 % cream Apply topically 2 (two) times daily.    . clarithromycin (BIAXIN) 250 MG tablet Take 250 mg by mouth once. 1 hr prior to dental appts.    . dicyclomine (BENTYL) 10 MG capsule Take 10 mg by mouth 4 (four) times daily -  before meals and at bedtime.    . famotidine (PEPCID) 40 MG tablet Take 40 mg by mouth 2 (two) times daily. Taking once daily (33m)    . FISH OIL-VITAMIN D PO Take 1,400 mg by mouth 2 (two) times a day.    . fluticasone (FLONASE) 50 MCG/ACT nasal spray Place 2 sprays into both nostrils at bedtime.    . Fluvoxamine Maleate 100 MG CP24 Take 1 capsule by mouth once.    . Glucosamine-Chondroit-Vit C-Mn (GLUCOSAMINE 1500 COMPLEX PO) Take 1 capsule by mouth 2 (two)  times daily.    .Marland KitchenHYDROcodone-acetaminophen (NORCO/VICODIN) 5-325 MG tablet Take 1 tablet by mouth every 4 (four) hours as needed. (Patient taking differently: Take 1-2 tablets by mouth every 4 (four) hours as needed. ) 15 tablet 0  . hydrOXYzine (ATARAX/VISTARIL) 25 MG tablet Take 25 mg by mouth every 4 (four) hours as needed.    . mirtazapine (REMERON) 30 MG tablet Take 30 mg by mouth at bedtime.    . Multiple Vitamins-Minerals (PRESERVISION AREDS PO) Take 1 tablet by mouth 2 (two) times daily.    . ondansetron (ZOFRAN) 4 MG tablet Take 4 mg by mouth 3 (three) times daily.    . OXYGEN Inhale 2 L into the lungs at bedtime.    .Marland KitchenPHENobarbital (LUMINAL) 64.8 MG tablet Take 64.8 mg by mouth daily with supper.   1  . polycarbophil (FIBERCON) 625 MG tablet Take 1,400 mg by mouth 2 (two) times daily.     . polyethylene glycol (CLEARLAX) packet Take 17 g by mouth daily.     .Marland KitchenrOPINIRole (REQUIP) 0.25 MG tablet Take 0.25 mg by mouth 3 (three) times daily.    .Marland Kitchensenna (SENOKOT) 8.6 MG tablet Take 1 tablet by mouth 2 (two) times daily.    . simvastatin (ZOCOR) 40 MG tablet Take 40 mg by mouth at bedtime. for cholesterol  3  . SUPER B COMPLEX/C PO Take 1 tablet by mouth every morning.     . traZODone (DESYREL) 50 MG tablet Take 50 mg by mouth at bedtime.    . Multiple Vitamin (MULTIVITAMIN WITH MINERALS) TABS tablet Take 1 tablet by mouth daily.    Marland Kitchen omeprazole (PRILOSEC) 20 MG capsule Take 1 capsule (20 mg total) by mouth daily. 30 capsule 3  . omeprazole (PRILOSEC) 40 MG capsule Take 1 capsule (40 mg total) by mouth daily for 30 days. 30 capsule 3   No current facility-administered medications for this visit.     Allergies Allergies  Allergen Reactions  . Phenergan [Promethazine] Other (See Comments)    Cramps, shakes.  . Augmentin [Amoxicillin-Pot Clavulanate] Other (See Comments)    UNKNOWN [SEE BELOW]  Has patient had a PCN reaction causing immediate rash, facial/tongue/throat swelling, SOB  or lightheadedness with hypotension: Unknown Has patient had a PCN reaction causing severe rash involving mucus membranes or skin necrosis: Uknown PATIENT HAS HAD A PCN REACTION THAT REQUIRED HOSPITALIZATION: >> UNSPECIFIED REACTION WHILE HE WAS ALREADY ADMITTED TO A HOSPITAL.  Has patient had a PCN reaction occurring within the last 10 years: No   . Oxycodone Other (See Comments)    UNSPECIFIED REACTION  TOLERATES APAP WITHOUT OXYCODONE  . Percocet [Oxycodone-Acetaminophen] Other (See Comments)    Unknown. Tolerates acetaminophen alone.    Histories Past Medical History:  Diagnosis Date  . Anxiety   . Arthritis   . Cervical myelopathy ([place])   . Complication of anesthesia   . Elevated LFTs    CBD dilated  . GERD (gastroesophageal reflux disease)   . Headache   . Hyperlipidemia   . IBS (irritable bowel syndrome)   . On home oxygen therapy    "2L when I sleep" (04/17/2017)  . Pneumonia   . PONV (postoperative nausea and vomiting)   . Scoliosis   . Seizures ([place])   . Seizures ([place])    " Haven't had one since 1976"  per spouse.  . Sleep apnea    Uses O2 at 2L while sleeping; "can't tolerate CPAP OR BIPAP" * (04/17/2017)  . Vertigo   . Wears glasses    Past Surgical History:  Procedure Laterality Date  . BACK SURGERY    . BILIARY STENT PLACEMENT  12/10/2018   Procedure: BILIARY STENT PLACEMENT;  Surgeon: Irving Copas., MD;  Location: [place];  Service: Gastroenterology;;  . BIOPSY  12/10/2018   Procedure: BIOPSY;  Surgeon: Irving Copas., MD;  Location: [place];  Service: Gastroenterology;;  . CARDIAC CATHETERIZATION    . CHOLECYSTECTOMY    . COLONOSCOPY    . ENDOSCOPIC RETROGRADE CHOLANGIOPANCREATOGRAPHY (ERCP) WITH PROPOFOL N/A 12/10/2018   Procedure: ENDOSCOPIC RETROGRADE CHOLANGIOPANCREATOGRAPHY (ERCP) WITH PROPOFOL;  Surgeon: Rush Landmark Telford Nab., MD;  Location: [place];  Service: Gastroenterology;  Laterality: N/A;  NEED  2 1/2 HOUR FOR  CASE-JILL/PATTY  . ESOPHAGOGASTRODUODENOSCOPY (EGD) WITH PROPOFOL N/A 12/10/2018   Procedure: ESOPHAGOGASTRODUODENOSCOPY (EGD) WITH PROPOFOL;  Surgeon: Rush Landmark Telford Nab., MD;  Location: [facility] [place];  Service: Gastroenterology;  Laterality: N/A;  . FRACTURE SURGERY     Left plates and screws  . pain stimulator   2012  . pain stimulator   2012   removed 2012  . PARTIAL KNEE ARTHROPLASTY Right 04/17/2017   Procedure: UNICOMPARTMENTAL KNEE;  Surgeon: Vickey Huger, MD;  Location: [place];  Service: Orthopedics;  Laterality: Right;  . REMOVAL OF STONES  12/10/2018   Procedure: REMOVAL OF STONES;  Surgeon: Irving Copas., MD;  Location: [facility];  Service: Gastroenterology;;  . REPLACEMENT UNICONDYLAR JOINT KNEE Right 04/17/2017  . SPHINCTEROTOMY  12/10/2018   Procedure: SPHINCTEROTOMY;  Surgeon: Mansouraty, Telford Nab., MD;  Location: [facility];  Service: Gastroenterology;;  . Bess Kinds CHOLANGIOSCOPY N/A 12/10/2018   Procedure: ZDGLOVFI CHOLANGIOSCOPY;  Surgeon: Irving Copas., MD;  Location: [facility];  Service: Gastroenterology;  Laterality: N/A;  . TONSILLECTOMY    . UVULOPALATOPHARYNGOPLASTY (UPPP)/TONSILLECTOMY/SEPTOPLASTY    . VASECTOMY     Social History   Socioeconomic History  . Marital status: Married    Spouse name: Not on file  . Number of children: 3  . Years of education: Not on file  . Highest education level: Not on file  Occupational History  . Occupation: retired  Scientific laboratory technician  . Financial resource strain: Not on file  . Food insecurity    Worry: Not on file    Inability: Not on file  . Transportation needs    Medical: Not on file    Non-medical: Not on file  Tobacco Use  . Smoking status: Former Smoker    Types: Cigarettes    Quit date: 1966    Years since quitting: 54.7  . Smokeless tobacco: Never Used  . Tobacco comment: quit smoking cigarettes in 1966  Substance and Sexual Activity  . Alcohol use: No  . Drug use: No  . Sexual  activity: Not on file  Lifestyle  . Physical activity    Days per week: Not on file    Minutes per session: Not on file  . Stress: Not on file  Relationships  . Social Herbalist on phone: Not on file    Gets together: Not on file    Attends religious service: Not on file    Active member of club or organization: Not on file    Attends meetings of clubs or organizations: Not on file    Relationship status: Not on file  . Intimate partner violence    Fear of current or ex partner: Not on file    Emotionally abused: Not on file    Physically abused: Not on file    Forced sexual activity: Not on file  Other Topics Concern  . Not on file  Social History Narrative  . Not on file   Family History  Problem Relation Age of Onset  . Heart disease Mother   . Heart disease Father   . Breast cancer Sister   . Heart disease Brother   . Colon cancer Neg Hx   . Esophageal cancer Neg Hx   . Inflammatory bowel disease Neg Hx   . Liver disease Neg Hx   . Pancreatic cancer Neg Hx   . Rectal cancer Neg Hx   . Stomach cancer Neg Hx    I have reviewed his medical, social, and family history in detail and updated the electronic medical record as necessary.    PHYSICAL EXAMINATION  BP 126/80 (BP Location: Left Arm, Patient Position: Sitting, Cuff Size: Normal)   Pulse 72   Temp 97.9 F (36.6 C)   Ht '5\' 7"'  (1.702 m)   Wt 162 lb 6 oz (73.7 kg)   BMI 25.43 kg/m  Wt Readings from Last 3 Encounters:  06/25/19 162 lb 6 oz (73.7 kg)  04/09/19 163 lb (73.9 kg)  12/10/18 174 lb (78.9 kg)  GEN: Elderly, NAD, accompanied by wife (who provides majority of history) PSYCH: Cooperative, without pressured speech  EYE: Conjunctivae pink, sclerae anicteric ENT: MMM, without oral ulcers, no erythema or exudates noted NECK: Supple CV: RR without R/Gs  RESP: CTAB posteriorly, without wheezing GI: NABS, soft, mildly protuberant abdomen, well-healed surgical scar in upper abdomen, NT/ND,  without rebound or guarding, no HSM appreciated MSK/EXT: Trace bilateral lower extremity edema SKIN: No jaundice NEURO:  Alert & Oriented x 3, no focal deficits   REVIEW OF DATA  I reviewed the following data at the time of this encounter:  GI Procedures and Studies  No new relevant studies to review  Laboratory Studies  Reviewed those in epic  Imaging Studies  July 2020 CT abdomen pelvis with contrast IMPRESSION: 1. Liquid stool distends the entire colon with scattered air-fluid levels, suggesting diarrheal process or slow transit. No significant formed stool. No obstruction. 2. Post bile duct surgery with multiple surgical clips in the liver. Previous filling defect in the common hepatic duct is no longer seen. Focal dilatation of the right intrahepatic duct measuring 2.3 x 1.6 cm is new. There is an indeterminate 3.2 x 3.6 cm fluid density structure adjacent to surgical clips in the left lobe, that may represent focal biliary dilatation, but connection to the biliary tree is not well-defined on CT. No surrounding inflammatory. Questionable biliary enteric anastomosis between the stomach, recommend correlation with operative report. 3. Chronic dilated pancreatic duct, unchanged from prior. 4. Right adrenal adenoma, unchanged. 5. Mild T12 superior endplate compression fracture is new from March chest CT and may be acute. 6. Progressive medial right middle lobe consolidation with bronchiectasis since March chest CT. Patchy peribronchovascular opacities in the dependent right lower lobe. Infection or aspiration are considered. 7. Additional chronic findings as detailed above. 8.  Aortic Atherosclerosis (ICD10-I70.0).   ASSESSMENT  Mr. Tennell is a 78 y.o. 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  The patient is seen today for evaluation and management of:  1. 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 Status post biliary surgery   3. Abdominal pain, unspecified abdominal location   4. Abdominal cramping    This is a hemodynamically stable patient.  From a clinical perspective he has done exceedingly well from the findings of biliary papilloma with high-grade dysplasia/cholangiocarcinoma development.  Thankfully he has been able to have a biliary duct resection with hepaticojejunostomy anastomosis.  From a clinical perspective, I see no evidence of issues with his anastomosis and I do not believe his liver tests will be abnormal but we will check those today.  I will reach out to Dr. Colonel Bald so that we can discuss what imaging studies he would like to use to follow this patient over the course of the coming years.  I suspect we will likely proceed with an MRI abdomen at a six-month interval from his surgery so likely in December we can proceed with that.  Patient has had some findings and abnormalities on his recent CT scan from 3 months ago in July but we will plan to see how things look on follow-up imaging.  He is otherwise doing well and not having significant abdominal pains currently.  It looks like he was having abdominal cramping and pain as result of constipation however things have improved at this point in time.  The patient also describes a history of colon polyps and stated that his last colonoscopy was approximately 7  years ago and that he was overdue.  I told him that at this point in time we should hold on things other than focusing on his biliary tract because that is the most important thing.  Obviously I am so happy that we were able to make a diagnosis and that he is able to have it under a more definitive therapy to try and minimize his risk of recurrent disease.  We will see him in follow-up in a few months after his MRI is completed.  I  am going to obtain a chest x-ray because of these issues of recent congestion that he has been having however his lung exam is completely unremarkable with no adventitious sounds or rhonchi.  He has a history of an abnormal CT chest when we made his diagnosis and he has a pulmonologist that he follows with in [place] so we will just make sure he does not have a large infiltrate and check his labs to ensure he has no evidence of a white count but I suspect this is unlikely as he has no sputum production.  All patient questions were answered, to the best of my ability, and the patient agrees to the aforementioned plan of action with follow-up as indicated.   PLAN  Laboratories as outlined below Initiate omeprazole 20 mg daily and may increase to 20 mg twice daily to help with GERD After omeprazole is started 1 week later may stop taking famotidine We will discuss with Dr. Colonel Bald due to hepatobiliary surgery follow-up imaging for surveillance (likely MRI in December) We will obtain chest x-ray to ensure no evidence of large infiltrate however exam not suggestive of pneumonia and he has history of lung abnormalities on CT chest Continue current regimen for his bowel habits Continue dicyclomine as needed up to 4 times daily Holding on colonoscopy repeat for now   Orders Placed This Encounter  Procedures  . DG Chest 2 View  . CBC  . Comp Met (CMET)  . IgA  . Tissue transglutaminase, IgA    New Prescriptions   OMEPRAZOLE (PRILOSEC) 20 MG CAPSULE    Take 1 capsule (20 mg total) by mouth daily.   Modified Medications   No medications on file    Planned Follow Up No follow-ups on file.   [name], MD [facility] Office # [phone]

## 2019-06-25 NOTE — Telephone Encounter (Signed)
Rx switched to Omeprazole 20mg  once daily. Ins denied Nexium 20mg  once daily. Pts wife informed. Pharmacy given new rx over the phone.

## 2019-06-25 NOTE — Patient Instructions (Signed)
Continue Pepcid for 1 week, then stop.  We have sent the following medications to your pharmacy for you to pick up at your convenience: Nexium   Your provider has requested that you go to the basement level for lab work and chest xray before leaving today. Press "B" on the elevator. The lab is located at the first door on the left as you exit the elevator.   We will consider Imaging after speaking with Dr. Colonel Bald.   Thank you for choosing me and [facility]. Dr. Rush Landmark

## 2019-06-26 LAB — TISSUE TRANSGLUTAMINASE, IGA: (tTG) Ab, IgA: 1 U/mL

## 2019-06-27 ENCOUNTER — Encounter: Payer: Self-pay | Admitting: Gastroenterology

## 2019-06-28 ENCOUNTER — Encounter: Payer: Self-pay | Admitting: Gastroenterology

## 2019-06-28 DIAGNOSIS — R109 Unspecified abdominal pain: Secondary | ICD-10-CM | POA: Insufficient documentation

## 2019-06-28 DIAGNOSIS — R1084 Generalized abdominal pain: Secondary | ICD-10-CM | POA: Insufficient documentation

## 2019-06-28 DIAGNOSIS — Z860100 Personal history of colon polyps, unspecified: Secondary | ICD-10-CM | POA: Insufficient documentation

## 2019-06-28 DIAGNOSIS — C249 Malignant neoplasm of biliary tract, unspecified: Secondary | ICD-10-CM

## 2019-06-28 DIAGNOSIS — Z9889 Other specified postprocedural states: Secondary | ICD-10-CM | POA: Insufficient documentation

## 2019-06-28 HISTORY — DX: Malignant neoplasm of biliary tract, unspecified: C24.9

## 2019-09-06 ENCOUNTER — Other Ambulatory Visit: Payer: Self-pay | Admitting: Gastroenterology

## 2019-11-01 ENCOUNTER — Telehealth: Payer: Self-pay | Admitting: Gastroenterology

## 2019-11-01 NOTE — Telephone Encounter (Signed)
Pt's wife stated that pt was to be scheduled for an MRI but they never received a call to schedule.  Wife requested to schedule MRI at [facility] due to proximity.

## 2019-11-01 NOTE — Telephone Encounter (Signed)
Dr Rush Landmark please advise pt wife thinks he is for MRI

## 2019-11-02 NOTE — Telephone Encounter (Signed)
I believe that the patient is due for a MRI has a 52-month follow-up from his surgery done at [facility].  The MRI was to be done at [facility] or [facility].  Obtaining the MRI at [facility] may not give [place] the adequate imaging that Dr. Colonel Bald would need.  Plus trying to get the images and imaging protocols to [facility] may not be as ideal as they can be here.Ideally, I would order a MRI/MRCP Abdomen with contrast here at [facility] facility.  If there is just an inability to proceed with this here then we can give order to [facility], but we don't want to get a lower quality image and then have to do another. Please let me know what they decide. GM

## 2019-11-04 NOTE — Telephone Encounter (Signed)
Line rings busy  

## 2019-11-04 NOTE — Telephone Encounter (Signed)
I spoke with the pt's wife and she states she is going to call [facility] to get the MRI scheduled.  She states that she will call back if she has any problems getting this setup.

## 2019-11-12 NOTE — Telephone Encounter (Signed)
Pt wife called would like to proceed with scheduling MRI her at [facility].  Would like to speak with a nurse before scheduling

## 2019-11-13 ENCOUNTER — Other Ambulatory Visit: Payer: Self-pay

## 2019-11-13 DIAGNOSIS — C249 Malignant neoplasm of biliary tract, unspecified: Secondary | ICD-10-CM

## 2019-11-13 NOTE — Progress Notes (Signed)
Dr Rush Landmark the radiologist states that the MRI MRCP needs to be with and without. You asked for with, do you want me to change it?

## 2019-11-13 NOTE — Telephone Encounter (Signed)
With/Without is OK. Thank you. GM

## 2019-11-13 NOTE — Telephone Encounter (Signed)
The pt has been advised and will have MRI as planned.  Instructions verbalized and understanding voiced.

## 2019-11-13 NOTE — Telephone Encounter (Signed)
You have been scheduled for an MRI at [facility] on 11/26/19. Your appointment time is 9 am. Please arrive 30 minutes prior to your appointment time for registration purposes. Please make certain not to have anything to eat or drink after midnight prior to your test. In addition, if you have any metal in your body, have a pacemaker or defibrillator, please be sure to let your ordering physician know. This test typically takes 45 minutes to 1 hour to complete. Should you need to reschedule, please call [phone] to do so. (Radiology entrance)

## 2019-11-13 NOTE — Telephone Encounter (Signed)
Dr Rush Landmark the radiologist states that the MRI MRCP needs to be with and without. You asked for with, do you want me to change it?

## 2019-11-20 ENCOUNTER — Telehealth: Payer: Self-pay | Admitting: Gastroenterology

## 2019-11-20 NOTE — Telephone Encounter (Signed)
Pt's wife is concerned that pt is regurgitating food after eating.  She stated tghat pt had bile duct resection at [facility] 03/04/2019 and that this is a recent problem.

## 2019-11-20 NOTE — Telephone Encounter (Signed)
Left message on machine to call back  

## 2019-11-22 NOTE — Telephone Encounter (Signed)
The pt's wife states that her husband has some reflux problems and would like to schedule a visit with Dr Rush Landmark.  He has an MRI on 3/1.  Appt has been made for 4-1 and the pt will keep the appt for MRI

## 2019-11-22 NOTE — Telephone Encounter (Signed)
No answer and no voice mail.

## 2019-11-26 ENCOUNTER — Ambulatory Visit (HOSPITAL_COMMUNITY): Payer: Medicare Other

## 2019-12-02 ENCOUNTER — Ambulatory Visit (HOSPITAL_COMMUNITY)
Admission: RE | Admit: 2019-12-02 | Discharge: 2019-12-02 | Disposition: A | Discharge status: Home / Self Care | Payer: Medicare Other | Source: Ambulatory Visit | Attending: Gastroenterology | Admitting: Gastroenterology

## 2019-12-02 ENCOUNTER — Other Ambulatory Visit: Payer: Self-pay

## 2019-12-02 ENCOUNTER — Other Ambulatory Visit: Payer: Self-pay | Admitting: Gastroenterology

## 2019-12-02 DIAGNOSIS — C249 Malignant neoplasm of biliary tract, unspecified: Secondary | ICD-10-CM

## 2019-12-02 LAB — POCT I-STAT CREATININE: Creatinine, Ser: 0.8 mg/dL (ref 0.61–1.24)

## 2019-12-02 MED ORDER — GADOBUTROL 1 MMOL/ML IV SOLN
7.0000 mL | Freq: Once | INTRAVENOUS | Status: AC | PRN
  Administered 2019-12-02: 7 mL via INTRAVENOUS

## 2020-01-02 ENCOUNTER — Ambulatory Visit: Payer: Medicare Other | Admitting: Gastroenterology

## 2020-01-02 ENCOUNTER — Telehealth: Payer: Self-pay | Admitting: Gastroenterology

## 2020-01-02 NOTE — Telephone Encounter (Signed)
The pt was advised that per the message on 3/7 Dr Rush Landmark asked that the pt follow up with our office in 2-3 months and follow up with Dr Felton Clinton pulmonologist in regards to the findings seen in the lung.  The pt states that he has placed a call to Dr Felton Clinton and PCP to follow up with lung abnormality.  Also, the pt has been advised that an appt will be made for follow up in 2-3 months.  The pt has been advised of the information and verbalized understanding.

## 2020-01-02 NOTE — Telephone Encounter (Signed)
Patient and patient wife walked into the office stating that they had an appt today. Appt had been cancelled. Patient is confused on why appt had been cancelled. Patient wife states that Dr. Nadara Mustard) told them to follow up with primary care regarding lung issue.

## 2020-03-19 ENCOUNTER — Encounter: Payer: Self-pay | Admitting: Gastroenterology

## 2020-03-19 ENCOUNTER — Other Ambulatory Visit (INDEPENDENT_AMBULATORY_CARE_PROVIDER_SITE_OTHER): Payer: Medicare Other

## 2020-03-19 ENCOUNTER — Ambulatory Visit: Payer: Medicare Other | Admitting: Gastroenterology

## 2020-03-19 VITALS — BP 122/76 | HR 71 | Ht 67.0 in | Wt 175.2 lb

## 2020-03-19 DIAGNOSIS — C249 Malignant neoplasm of biliary tract, unspecified: Secondary | ICD-10-CM | POA: Diagnosis not present

## 2020-03-19 DIAGNOSIS — K2100 Gastro-esophageal reflux disease with esophagitis, without bleeding: Secondary | ICD-10-CM | POA: Diagnosis not present

## 2020-03-19 DIAGNOSIS — C221 Intrahepatic bile duct carcinoma: Secondary | ICD-10-CM | POA: Diagnosis not present

## 2020-03-19 DIAGNOSIS — Z9889 Other specified postprocedural states: Secondary | ICD-10-CM | POA: Diagnosis not present

## 2020-03-19 LAB — COMPREHENSIVE METABOLIC PANEL
ALT: 32 U/L (ref 0–53)
AST: 38 U/L — ABNORMAL HIGH (ref 0–37)
Albumin: 4.5 g/dL (ref 3.5–5.2)
Alkaline Phosphatase: 96 U/L (ref 39–117)
BUN: 27 mg/dL — ABNORMAL HIGH (ref 6–23)
CO2: 33 mEq/L — ABNORMAL HIGH (ref 19–32)
Calcium: 9.6 mg/dL (ref 8.4–10.5)
Chloride: 100 mEq/L (ref 96–112)
Creatinine, Ser: 0.81 mg/dL (ref 0.40–1.50)
GFR: 91.79 mL/min (ref >60.00)
Glucose, Bld: 112 mg/dL — ABNORMAL HIGH (ref 70–99)
Potassium: 4 mEq/L (ref 3.5–5.1)
Sodium: 137 mEq/L (ref 135–145)
Total Bilirubin: 0.3 mg/dL (ref 0.2–1.2)
Total Protein: 7.6 g/dL (ref 6.0–8.3)

## 2020-03-19 LAB — CBC
HCT: 39.5 % (ref 39.0–52.0)
Hemoglobin: 13.2 g/dL (ref 13.0–17.0)
MCHC: 33.5 g/dL (ref 30.0–36.0)
MCV: 96.5 fl (ref 78.0–100.0)
Platelets: 137 10*3/uL — ABNORMAL LOW (ref 150.0–400.0)
RBC: 4.09 Mil/uL — ABNORMAL LOW (ref 4.22–5.81)
RDW: 13.6 % (ref 11.5–15.5)
WBC: 5.4 10*3/uL (ref 4.0–10.5)

## 2020-03-19 NOTE — Patient Instructions (Addendum)
You have been scheduled for an MRI at [facility] on 06/10/20. Your appointment time is 10:00am. Please arrive 30 minutes prior to your appointment time for registration purposes. Please make certain not to have anything to eat or drink 4 hours prior to your test. In addition, if you have any metal in your body, have a pacemaker or defibrillator, please be sure to let your ordering physician know. This test typically takes 45 minutes to 1 hour to complete. Should you need to reschedule, please call [phone] to do so.  Your provider has requested that you go to the basement level for lab work before leaving today. Press "B" on the elevator. The lab is located at the first door on the left as you exit the elevator.  If you are age 27 or older, your body mass index should be between 23-30. Your Body mass index is 27.45 kg/m. If this is out of the aforementioned range listed, please consider follow up with your Primary Care Provider.  If you are age 62 or younger, your body mass index should be between 19-25. Your Body mass index is 27.45 kg/m. If this is out of the aformentioned range listed, please consider follow up with your Primary Care Provider.    Due to recent changes in healthcare laws, you may see the results of your imaging and laboratory studies on MyChart before your provider has had a chance to review them.  We understand that in some cases there may be results that are confusing or concerning to you. Not all laboratory results come back in the same time frame and the provider may be waiting for multiple results in order to interpret others.  Please give [place] 48 hours in order for your provider to thoroughly review all the results before contacting the office for clarification of your results.   Thank you for choosing me and [facility].  Dr. Rush Landmark

## 2020-03-22 ENCOUNTER — Encounter: Payer: Self-pay | Admitting: Gastroenterology

## 2020-03-22 DIAGNOSIS — K219 Gastro-esophageal reflux disease without esophagitis: Secondary | ICD-10-CM | POA: Insufficient documentation

## 2020-03-22 NOTE — Progress Notes (Signed)
GASTROENTEROLOGY OUTPATIENT CLINIC VISIT   Primary Care Provider [address] [place] [address] [phone]  Patient Profile: Samuel Olson is a 79 y.o. 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  The patient presents to the [facility] for an evaluation and management of problem(s) noted below:  Problem List 1. 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 Status post biliary duct resection with hepaticojejunostomy   3. Gastroesophageal reflux disease with esophagitis without hemorrhage     History of Present Illness Please see initial consultation note and progress notes for full details of HPI.    Interval History The patient returns for scheduled follow-up.  He has undergone CT chest imaging at home and is following up with his PCP and potentially his pulmonologist for findings of nodularity that have been seen previously on imaging as well.  Patient's GERD symptoms are relatively controlled on PPI therapy.  He denies any dysphagia.  Abdominal pain comes and goes but is tolerable.  Weight has been stable.  Denies significant nausea or vomiting.  Bowel habits are unchanged.  Last MRI was performed in March of this year which showed some intrahepatic dilation but normal liver tests.  He and his wife have been dealing emotionally with the loss of their daughter in the course of the last few months after she battled Covid and then developed septicemia.  This has been hard on both of them and the family.  Back pain continues to be a significant issue for the patient but grossly unchanged.  GI Review of Systems Positive as above  Negative for odynophagia, dysphagia, pain, melena, hematochezia   Review of Systems General: Denies  fevers/chills/weight loss unintentionally HEENT: Denies oral lesions Cardiovascular: Denies chest pain Pulmonary: Denies shortness of breath/nocturnal cough Gastroenterological: See HPI Genitourinary: Denies darkened urine Hematological: Denies easy bruising/bleeding Dermatological: Denies jaundice Psychological: Anxiousness and sadness due to recent loss of daughter   Medications Current Outpatient Medications  Medication Sig Dispense Refill  . acetaminophen (TYLENOL) 500 MG tablet Take 1,000 mg by mouth every 6 (six) hours as needed for mild pain or headache.    Marland Kitchen CALCIUM CITRATE PO Take 1-2 tablets by mouth daily.    . celecoxib (CELEBREX) 200 MG capsule Take 200 mg by mouth at bedtime.   12  . Cholecalciferol (VITAMIN D3 PO) Take 1,500 Units by mouth every morning.     . ciclopirox (LOPROX) 0.77 % cream Apply topically 2 (two) times daily.    . clarithromycin (BIAXIN) 250 MG tablet Take 250 mg by mouth once. 1 hr prior to dental appts.    Marland Kitchen FISH OIL-VITAMIN D PO Take 1,400 mg by mouth 2 (two) times a day.    . fluticasone (FLONASE) 50 MCG/ACT nasal spray Place 2 sprays into both nostrils at bedtime.    . Fluvoxamine Maleate 100 MG CP24 Take 1 capsule by mouth once.    . gabapentin (NEURONTIN) 300 MG capsule Take 300 mg by mouth 3 (three) times daily.    . Glucosamine-Chondroit-Vit C-Mn (GLUCOSAMINE 1500 COMPLEX PO) Take 1 capsule by mouth 2 (two) times daily.    Marland Kitchen HYDROcodone-acetaminophen (NORCO/VICODIN) 5-325 MG tablet Take 1 tablet by mouth every 4 (four) hours as needed. (Patient taking differently: Take 1-2 tablets by mouth every 4 (four) hours as needed. ) 15 tablet 0  .  hydrOXYzine (ATARAX/VISTARIL) 25 MG tablet Take 25 mg by mouth every 4 (four) hours as needed.    . Multiple Vitamin (MULTIVITAMIN WITH MINERALS) TABS tablet Take 1 tablet by mouth daily.    . Multiple Vitamins-Minerals (PRESERVISION AREDS PO) Take 1 tablet by mouth 2 (two) times daily.    Marland Kitchen omeprazole  (PRILOSEC) 20 MG capsule TAKE 1 CAPSULE BY MOUTH EVERY DAY 90 capsule 1  . OXYGEN Inhale 2 L into the lungs at bedtime.    Marland Kitchen PHENobarbital (LUMINAL) 64.8 MG tablet Take 64.8 mg by mouth daily with supper.   1  . polycarbophil (FIBERCON) 625 MG tablet Take 1,400 mg by mouth 2 (two) times daily.     . polyethylene glycol (CLEARLAX) packet Take 17 g by mouth daily.     Marland Kitchen rOPINIRole (REQUIP) 0.25 MG tablet Take 0.25 mg by mouth 3 (three) times daily.    . simvastatin (ZOCOR) 40 MG tablet Take 40 mg by mouth at bedtime. for cholesterol  3  . SUPER B COMPLEX/C PO Take 1 tablet by mouth every morning.     . traZODone (DESYREL) 50 MG tablet Take 50 mg by mouth at bedtime.     No current facility-administered medications for this visit.    Allergies Allergies  Allergen Reactions  . Phenergan [Promethazine] Other (See Comments)    Cramps, shakes.  . Augmentin [Amoxicillin-Pot Clavulanate] Other (See Comments)    UNKNOWN [SEE BELOW]  Has patient had a PCN reaction causing immediate rash, facial/tongue/throat swelling, SOB or lightheadedness with hypotension: Unknown Has patient had a PCN reaction causing severe rash involving mucus membranes or skin necrosis: Uknown PATIENT HAS HAD A PCN REACTION THAT REQUIRED HOSPITALIZATION: >> UNSPECIFIED REACTION WHILE HE WAS ALREADY ADMITTED TO A HOSPITAL.  Has patient had a PCN reaction occurring within the last 10 years: No   . Oxycodone Other (See Comments)    UNSPECIFIED REACTION  TOLERATES APAP WITHOUT OXYCODONE  . Percocet [Oxycodone-Acetaminophen] Other (See Comments)    Unknown. Tolerates acetaminophen alone.    Histories Past Medical History:  Diagnosis Date  . Anxiety   . Arthritis   . 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 Cervical myelopathy ([place])   . Complication of anesthesia   . Elevated LFTs    CBD dilated  . GERD (gastroesophageal reflux disease)   . Headache   . Hyperlipidemia   . IBS (irritable bowel syndrome)   . On home  oxygen therapy    "2L when I sleep" (04/17/2017)  . Pneumonia   . PONV (postoperative nausea and vomiting)   . Scoliosis   . Seizures ([place])   . Seizures ([place])    " Haven't had one since 1976"  per spouse.  . Sleep apnea    Uses O2 at 2L while sleeping; "can't tolerate CPAP OR BIPAP" * (04/17/2017)  . Vertigo   . Wears glasses    Past Surgical History:  Procedure Laterality Date  . BACK SURGERY    . BILIARY STENT PLACEMENT  12/10/2018   Procedure: BILIARY STENT PLACEMENT;  Surgeon: Irving Copas., MD;  Location: [place];  Service: Gastroenterology;;  . BIOPSY  12/10/2018   Procedure: BIOPSY;  Surgeon: Irving Copas., MD;  Location: [place];  Service: Gastroenterology;;  . CARDIAC CATHETERIZATION    . CHOLECYSTECTOMY    . COLONOSCOPY    . ENDOSCOPIC RETROGRADE CHOLANGIOPANCREATOGRAPHY (ERCP) WITH PROPOFOL N/A 12/10/2018   Procedure: ENDOSCOPIC RETROGRADE CHOLANGIOPANCREATOGRAPHY (ERCP) WITH PROPOFOL;  Surgeon: Irving Copas., MD;  [facility];  Service: Gastroenterology;  Laterality: N/A;  NEED  2 1/2 HOUR FOR CASE-JILL/PATTY  . ESOPHAGOGASTRODUODENOSCOPY (EGD) WITH PROPOFOL N/A 12/10/2018   Procedure: ESOPHAGOGASTRODUODENOSCOPY (EGD) WITH PROPOFOL;  Surgeon: Rush Landmark Telford Nab., MD;  Location: [place];  Service: Gastroenterology;  Laterality: N/A;  . FRACTURE SURGERY     Left plates and screws  . pain stimulator   2012  . pain stimulator   2012   removed 2012  . PARTIAL KNEE ARTHROPLASTY Right 04/17/2017   Procedure: UNICOMPARTMENTAL KNEE;  Surgeon: Vickey Huger, MD;  Location: [place];  Service: Orthopedics;  Laterality: Right;  . REMOVAL OF STONES  12/10/2018   Procedure: REMOVAL OF STONES;  Surgeon: Rush Landmark Telford Nab., MD;  Location: [place];  Service: Gastroenterology;;  . REPLACEMENT UNICONDYLAR JOINT KNEE Right 04/17/2017  . SPHINCTEROTOMY  12/10/2018   Procedure: SPHINCTEROTOMY;  Surgeon: Mansouraty, Telford Nab., MD;   Location: [place];  Service: Gastroenterology;;  . Bess Kinds CHOLANGIOSCOPY N/A 12/10/2018   Procedure: QMGNOIBB CHOLANGIOSCOPY;  Surgeon: Irving Copas., MD;  Location: [place];  Service: Gastroenterology;  Laterality: N/A;  . TONSILLECTOMY    . UVULOPALATOPHARYNGOPLASTY (UPPP)/TONSILLECTOMY/SEPTOPLASTY    . VASECTOMY     Social History   Socioeconomic History  . Marital status: Married    Spouse name: Not on file  . Number of children: 3  . Years of education: Not on file  . Highest education level: Not on file  Occupational History  . Occupation: retired  Tobacco Use  . Smoking status: Former Smoker    Types: Cigarettes    Quit date: 1966    Years since quitting: 55.5  . Smokeless tobacco: Never Used  . Tobacco comment: quit smoking cigarettes in 1966  Vaping Use  . Vaping Use: Never used  Substance and Sexual Activity  . Alcohol use: No  . Drug use: No  . Sexual activity: Not on file  Other Topics Concern  . Not on file  Social History Narrative  . Not on file   Social Determinants of Health   Financial Resource Strain:   . Difficulty of Paying Living Expenses:   Food Insecurity:   . Worried About Charity fundraiser in the Last Year:   . Arboriculturist in the Last Year:   Transportation Needs:   . Film/video editor (Medical):   Marland Kitchen Lack of Transportation (Non-Medical):   Physical Activity:   . Days of Exercise per Week:   . Minutes of Exercise per Session:   Stress:   . Feeling of Stress :   Social Connections:   . Frequency of Communication with Friends and Family:   . Frequency of Social Gatherings with Friends and Family:   . Attends Religious Services:   . Active Member of Clubs or Organizations:   . Attends Archivist Meetings:   Marland Kitchen Marital Status:   Intimate Partner Violence:   . Fear of Current or Ex-Partner:   . Emotionally Abused:   Marland Kitchen Physically Abused:   . Sexually Abused:    Family History  Problem Relation  Age of Onset  . Heart disease Mother   . Heart disease Father   . Breast cancer Sister   . Heart disease Brother   . Colon cancer Neg Hx   . Esophageal cancer Neg Hx   . Inflammatory bowel disease Neg Hx   . Liver disease Neg Hx   . Pancreatic cancer Neg Hx   . Rectal cancer Neg Hx   .  Stomach cancer Neg Hx    I have reviewed his medical, social, and family history in detail and updated the electronic medical record as necessary.    PHYSICAL EXAMINATION  BP 122/76   Pulse 71   Ht '5\' 7"'  (1.702 m)   Wt 175 lb 4 oz (79.5 kg)   BMI 27.45 kg/m  Wt Readings from Last 3 Encounters:  03/19/20 175 lb 4 oz (79.5 kg)  06/25/19 162 lb 6 oz (73.7 kg)  04/09/19 163 lb (73.9 kg)  GEN: Elderly, NAD, accompanied by wife PSYCH: Cooperative, without pressured speech EYE: Conjunctivae pink, sclerae anicteric ENT: MMM CV: Nontachycardic RESP: No audible wheezing GI: NABS, soft, mildly protuberant abdomen, well-healed surgical scar in upper abdomen, NT/ND, without rebound or guarding, no HSM appreciated  MSK/EXT: Trace bilateral lower extremity edema SKIN: No jaundice NEURO:  Alert & Oriented x 3, no focal deficits   REVIEW OF DATA  I reviewed the following data at the time of this encounter:  GI Procedures and Studies  No new relevant studies to review  Laboratory Studies  Reviewed those in epic  Imaging Studies  March 2021 MRI/MRCP IMPRESSION: 1. Status post hepaticojejunostomy with no evidence of a recurrent biliary mass in the porta hepatis. Minimal diffuse central intrahepatic biliary ductal dilatation is unchanged from 04/10/2019 CT and within normal post procedure limits. No intrahepatic biliary filling defects. 2. No findings of metastatic disease in the abdomen. 3. Scattered small nodular opacities at the right lung base up to 8 mm, not well evaluated by MRI, some potentially new. Recommend dedicated chest CT with IV contrast at this time. 4. Stable right adrenal  adenoma. 5. Diffuse large colonic stool volume, suggesting constipation. 6. Small hiatal hernia.   ASSESSMENT  Mr. Meriwether is a 79 y.o. 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  The patient is seen today for evaluation and management of:  1. 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 Status post biliary duct resection with hepaticojejunostomy   3. Gastroesophageal reflux disease with esophagitis without hemorrhage    The patient is hemodynamically and clinically stable.  He is doing well on current PPI therapy.  The patient at the time of my ERCP last year had evidence of grade C esophagitis.  While on PPI therapy he is doing much better.  He is having no dysphagia.  At some point in time we can certainly consider a repeat upper endoscopy to evaluate and ensure things are looking well and healed.  We can certainly also consider the role of a colonoscopy for final screening.  That is not something that I feel we need to discuss further today due to the significant issues that have occurred over the course the last few months in regards to the patient's family and loss of their daughter.  He will follow up with his PCP about the findings on previous MRI of adrenal adenoma which looks to be stable.  He will follow-up with his PCP and his pulmonologist in regards to the pulmonary nodules (they have already obtained a CT chest and further work-up of this which I have reviewed and will be scanned into the chart).  We will plan a 72-monthfollow-up MRI/MRCP from his last MRI/MRCP to follow-up his biliary tract cancer.  Once those results return I will discuss his findings with Dr. RColonel Bald  We will  repeat liver tests at this time.  All patient questions were answered, to the best of my ability, and the  patient agrees to the aforementioned plan of action with follow-up as indicated.   PLAN  Continue omeprazole 20 mg daily and may increase to 20 mg twice daily if needed We will discuss at future visit potential endoscopy to check on healing of esophagus and consider dilation if patient has any symptoms (no dysphagia currently) Consider colonoscopy depending on how patient does in regards to his bowel habits for screening/diagnostic purposes (not having significant diarrhea currently) MRI/MRCP in 19-monthfollow-up from last to follow-up his hepatobiliary surgery Pulmonary findings to be followed by primary care provider/pulmonologist Adrenal adenoma to be followed by primary care provider laboratories as outlined below Continue dicyclomine as needed up to 4 times daily Laboratories as outlined below   Orders Placed This Encounter  Procedures  . MR ABDOMEN MRCP W WO CONTAST  . CBC  . Comp Met (CMET)    New Prescriptions   No medications on file   Modified Medications   No medications on file    Planned Follow Up Return in about 3 months (around 06/19/2020).   Total Time in Face-to-Face and in Coordination of Care for patient including independent/personal interpretation/review of prior testing, medical history, examination, medication adjustment, communicating results with the patient directly, and documentation with the EHR is 25 minutes.   GJustice Britain MD [facility] Office # [phone]

## 2020-06-10 ENCOUNTER — Ambulatory Visit (HOSPITAL_COMMUNITY): Payer: Medicare Other

## 2020-06-15 ENCOUNTER — Other Ambulatory Visit: Payer: Self-pay | Admitting: Gastroenterology

## 2020-06-15 ENCOUNTER — Ambulatory Visit (HOSPITAL_COMMUNITY)
Admission: RE | Admit: 2020-06-15 | Discharge: 2020-06-15 | Disposition: A | Discharge status: Home / Self Care | Payer: Medicare Other | Source: Ambulatory Visit | Attending: Gastroenterology | Admitting: Gastroenterology

## 2020-06-15 ENCOUNTER — Other Ambulatory Visit: Payer: Self-pay

## 2020-06-15 DIAGNOSIS — C249 Malignant neoplasm of biliary tract, unspecified: Secondary | ICD-10-CM

## 2020-06-15 MED ORDER — GADOBUTROL 1 MMOL/ML IV SOLN
8.0000 mL | Freq: Once | INTRAVENOUS | Status: AC | PRN
  Administered 2020-06-15: 8 mL via INTRAVENOUS

## 2020-06-19 ENCOUNTER — Telehealth: Payer: Self-pay | Admitting: Gastroenterology

## 2020-06-19 NOTE — Telephone Encounter (Signed)
Pt's spouse is wanting to inform the nurse the pt saw a Pulmonologist yesterday in [place].

## 2020-06-19 NOTE — Telephone Encounter (Signed)
The pt's wife was asked to have the records faxed to our office for review.  The pt has been advised of the information and verbalized understanding.

## 2020-07-30 ENCOUNTER — Telehealth: Payer: Self-pay

## 2020-07-30 NOTE — Telephone Encounter (Signed)
MRI scheduled and completed in September.  He will have repeat in 1 year

## 2020-07-30 NOTE — Telephone Encounter (Signed)
-----   Message from Timothy Lasso, RN sent at 07/24/2020 11:48 AM EDT -----  ----- Message ----- From: Timothy Lasso, RN Sent: 07/24/2020 To: Timothy Lasso, RN   ----- Message ----- From: Timothy Lasso, RN Sent: 07/08/2020 To: Timothy Lasso, RN   ----- Message ----- From: Timothy Lasso, RN Sent: 06/24/2020 To: Timothy Lasso, RN  repeat MRI/MRCP in September or October of this year.

## 2020-08-10 ENCOUNTER — Telehealth: Payer: Self-pay

## 2020-08-10 NOTE — Telephone Encounter (Signed)
The pt MRI has been completed see September report

## 2020-08-10 NOTE — Telephone Encounter (Signed)
-----   Message from Timothy Lasso, RN sent at 12/09/2019  1:06 PM EST ----- Pt needs MRI see 3/8 note

## 2021-06-21 ENCOUNTER — Other Ambulatory Visit: Payer: Self-pay

## 2021-06-21 ENCOUNTER — Telehealth: Payer: Self-pay

## 2021-06-21 DIAGNOSIS — Z9889 Other specified postprocedural states: Secondary | ICD-10-CM

## 2021-06-21 DIAGNOSIS — R109 Unspecified abdominal pain: Secondary | ICD-10-CM

## 2021-06-21 DIAGNOSIS — R1084 Generalized abdominal pain: Secondary | ICD-10-CM

## 2021-06-21 DIAGNOSIS — C249 Malignant neoplasm of biliary tract, unspecified: Secondary | ICD-10-CM

## 2021-06-21 NOTE — Telephone Encounter (Signed)
Called pt and informed about MRI orders as well as need for CMP prior to MRI. Advised he is welcome to stop by the lab in our office at his earliest [place] having his MRI. Verbalized acceptance and understanding.  Following message sent to scheduling dept for MRI:  [facility] Phone: [phone] Fax: [phone]   Imaging Ordered: MRI/MRCP  Diagnosis: Cholangiocarcinoma, status post common hepatic duct resection and hepaticojejunostomy  Ordering Provider: Dr. Rush Landmark  Is a Prior Authorization needed? We are in the process of obtaining it now  Is the patient Diabetic? No  Does the patient have Hypertension? No  Does the patient have any implanted devices or hardware? No  Date of last BUN/Creat, if needed? Ordered today  Patient Weight? 175#  Is the patient able to get on the table? Yes  Has the patient been diagnosed with COVID? No  Is the patient waiting on COVID testing results? No  Thank you for your assistance! [facility]

## 2021-06-24 ENCOUNTER — Other Ambulatory Visit (INDEPENDENT_AMBULATORY_CARE_PROVIDER_SITE_OTHER): Payer: Medicare Other

## 2021-06-24 DIAGNOSIS — R1084 Generalized abdominal pain: Secondary | ICD-10-CM

## 2021-06-24 DIAGNOSIS — Z9889 Other specified postprocedural states: Secondary | ICD-10-CM | POA: Diagnosis not present

## 2021-06-24 DIAGNOSIS — C249 Malignant neoplasm of biliary tract, unspecified: Secondary | ICD-10-CM | POA: Diagnosis not present

## 2021-06-24 DIAGNOSIS — R109 Unspecified abdominal pain: Secondary | ICD-10-CM

## 2021-06-24 LAB — COMPREHENSIVE METABOLIC PANEL
ALT: 27 U/L (ref 0–53)
AST: 35 U/L (ref 0–37)
Albumin: 4.2 g/dL (ref 3.5–5.2)
Alkaline Phosphatase: 86 U/L (ref 39–117)
BUN: 27 mg/dL — ABNORMAL HIGH (ref 6–23)
CO2: 34 mEq/L — ABNORMAL HIGH (ref 19–32)
Calcium: 9.6 mg/dL (ref 8.4–10.5)
Chloride: 99 mEq/L (ref 96–112)
Creatinine, Ser: 0.73 mg/dL (ref 0.40–1.50)
GFR: 85.73 mL/min (ref >60.00)
Glucose, Bld: 87 mg/dL (ref 70–99)
Potassium: 4.2 mEq/L (ref 3.5–5.1)
Sodium: 138 mEq/L (ref 135–145)
Total Bilirubin: 0.4 mg/dL (ref 0.2–1.2)
Total Protein: 7.1 g/dL (ref 6.0–8.3)

## 2021-07-02 ENCOUNTER — Ambulatory Visit (HOSPITAL_COMMUNITY)
Admission: RE | Admit: 2021-07-02 | Discharge: 2021-07-02 | Disposition: A | Discharge status: Home / Self Care | Payer: Medicare Other | Source: Ambulatory Visit | Attending: Gastroenterology | Admitting: Gastroenterology

## 2021-07-02 ENCOUNTER — Other Ambulatory Visit: Payer: Self-pay | Admitting: Gastroenterology

## 2021-07-02 ENCOUNTER — Other Ambulatory Visit: Payer: Self-pay

## 2021-07-02 DIAGNOSIS — R109 Unspecified abdominal pain: Secondary | ICD-10-CM

## 2021-07-02 DIAGNOSIS — Z9889 Other specified postprocedural states: Secondary | ICD-10-CM

## 2021-07-02 DIAGNOSIS — R1084 Generalized abdominal pain: Secondary | ICD-10-CM | POA: Diagnosis present

## 2021-07-02 DIAGNOSIS — C249 Malignant neoplasm of biliary tract, unspecified: Secondary | ICD-10-CM

## 2021-07-02 MED ORDER — GADOBUTROL 1 MMOL/ML IV SOLN
8.0000 mL | Freq: Once | INTRAVENOUS | Status: AC | PRN
  Administered 2021-07-02: 8 mL via INTRAVENOUS

## 2021-07-15 ENCOUNTER — Encounter: Payer: Self-pay | Admitting: Gastroenterology

## 2021-07-15 ENCOUNTER — Ambulatory Visit (INDEPENDENT_AMBULATORY_CARE_PROVIDER_SITE_OTHER): Payer: Medicare Other | Admitting: Gastroenterology

## 2021-07-15 VITALS — BP 100/80 | HR 56 | Ht 67.0 in | Wt 170.0 lb

## 2021-07-15 DIAGNOSIS — K5909 Other constipation: Secondary | ICD-10-CM | POA: Diagnosis not present

## 2021-07-15 DIAGNOSIS — Z9889 Other specified postprocedural states: Secondary | ICD-10-CM

## 2021-07-15 DIAGNOSIS — C249 Malignant neoplasm of biliary tract, unspecified: Secondary | ICD-10-CM

## 2021-07-15 DIAGNOSIS — R1013 Epigastric pain: Secondary | ICD-10-CM | POA: Diagnosis not present

## 2021-07-15 MED ORDER — OMEPRAZOLE 20 MG PO CPDR: 20.0000 mg | DELAYED_RELEASE_CAPSULE | Freq: Two times a day (BID) | ORAL | 2 refills | Status: DC

## 2021-07-15 NOTE — Patient Instructions (Signed)
We have sent the following medications to your pharmacy for you to pick up at your convenience: Omeprazole  Please increase your Omeprazole to 20mg  twice daily.   Follow up in 11/2021. Office will call to schedule.   If you are age 81 or older, your body mass index should be between 23-30. Your Body mass index is 26.63 kg/m. If this is out of the aforementioned range listed, please consider follow up with your Primary Care Provider.  If you are age 40 or younger, your body mass index should be between 19-25. Your Body mass index is 26.63 kg/m. If this is out of the aformentioned range listed, please consider follow up with your Primary Care Provider.   __________________________________________________________  The [facility] providers would like to encourage you to use [facility] to communicate with providers for non-urgent requests or questions.  Due to long hold times on the telephone, sending your provider a message by [facility] may be a faster and more efficient way to get a response.  Please allow 48 business hours for a response.  Please remember that this is for non-urgent requests.   Thank you for choosing me and [facility].  Dr. Rush Landmark

## 2021-07-15 NOTE — Progress Notes (Signed)
GASTROENTEROLOGY OUTPATIENT CLINIC VISIT   Primary Care Provider Elyn Aquas [address] [place] [address] [phone]  Patient Profile: Samuel Olson is a 80 y.o. 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  The patient presents to the [facility] for an evaluation and management of problem(s) noted below:  Problem List 1. 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 Dyspepsia   3. Chronic constipation   4. Status post biliary surgery      History of Present Illness Please see prior notes for full details of HPI.  Interval History Today, the patient returns for a scheduled follow-up.  He is accompanied by his son as his wife is dealing with medical issues but she is available on the phone and gives history as well.  The patient underwent his yearly MRI to further follow-up his history of cholangiocarcinoma status postsurgical resection.  Findings have been overall stable.  With that being said, there does seem to be some mild progressive pancreatic duct dilation that is present without overt mass lesion.  The patient's GERD symptoms remain relatively controlled with PPI therapy but he is describing some postprandial abdominal epigastric pain that occurs at times and is having some spasm-like issues that occur at times.  His chronic constipation remains an issue but as long as he is taking his MiraLAX he seems to be going on a daily to every other day basis.  The patient denies any dysphagia symptoms.  GI Review of Systems Positive as above  Negative for odynophagia, nausea, vomiting, alteration of bowel habits, melena, hematochezia   Review of Systems General: Denies fevers/chills/weight loss  unintentionally Cardiovascular: Denies chest pain Pulmonary: Denies shortness of breath Gastroenterological: See HPI Genitourinary: Denies darkened urine Hematological: Denies easy bruising/bleeding Dermatological: Denies jaundice Psychological: Mood is stable   Medications Current Outpatient Medications  Medication Sig Dispense Refill   acetaminophen (TYLENOL) 500 MG tablet Take 1,000 mg by mouth every 6 (six) hours as needed for mild pain or headache.     albuterol (VENTOLIN HFA) 108 (90 Base) MCG/ACT inhaler Inhale 1-2 puffs into the lungs as needed for wheezing or shortness of breath.     aspirin EC 81 MG tablet Take 81 mg by mouth daily. Swallow whole.     CALCIUM CITRATE PO Take 1-2 tablets by mouth daily.     celecoxib (CELEBREX) 200 MG capsule Take 200 mg by mouth at bedtime.   12   Cholecalciferol (VITAMIN D3 PO) Take 1,500 Units by mouth every morning.      ciclopirox (LOPROX) 0.77 % cream Apply topically 2 (two) times daily.     FISH OIL-VITAMIN D PO Take 1,400 mg by mouth 2 (two) times a day.     fluticasone (FLONASE) 50 MCG/ACT nasal spray Place 2 sprays into both nostrils at bedtime.     Fluvoxamine Maleate 100 MG CP24 Take 1 capsule by mouth once.     gabapentin (NEURONTIN) 300 MG capsule Take 300 mg by mouth 3 (three) times daily.     Glucosamine-Chondroit-Vit C-Mn (GLUCOSAMINE 1500 COMPLEX PO) Take 1 capsule by mouth 2 (two) times daily.     HYDROcodone-acetaminophen (NORCO/VICODIN) 5-325 MG tablet Take 1 tablet by mouth every 4 (four) hours as needed. (Patient taking differently: Take 1-2 tablets by mouth every 4 (four) hours as needed.) 15  tablet 0   hydrOXYzine (ATARAX/VISTARIL) 25 MG tablet Take 25 mg by mouth every 4 (four) hours as needed.     Multiple Vitamin (MULTIVITAMIN WITH MINERALS) TABS tablet Take 1 tablet by mouth daily.     Multiple Vitamins-Minerals (PRESERVISION AREDS PO) Take 1 tablet by mouth 2 (two) times daily.     OXYGEN Inhale 2 L into the lungs  at bedtime.     PHENobarbital (LUMINAL) 64.8 MG tablet Take 64.8 mg by mouth daily with supper.   1   polycarbophil (FIBERCON) 625 MG tablet Take 1,400 mg by mouth 2 (two) times daily.      polyethylene glycol (MIRALAX / GLYCOLAX) 17 g packet Take 17 g by mouth daily.      rOPINIRole (REQUIP) 0.25 MG tablet Take 0.25 mg by mouth 3 (three) times daily.     simvastatin (ZOCOR) 40 MG tablet Take 40 mg by mouth at bedtime. for cholesterol  3   SUPER B COMPLEX/C PO Take 1 tablet by mouth every morning.      traZODone (DESYREL) 50 MG tablet Take 50 mg by mouth at bedtime.     clarithromycin (BIAXIN) 250 MG tablet Take 250 mg by mouth once. 1 hr prior to dental appts. (Patient not taking: Reported on 07/15/2021)     omeprazole (PRILOSEC) 20 MG capsule Take 1 capsule (20 mg total) by mouth 2 (two) times daily before a meal. 60 capsule 2   No current facility-administered medications for this visit.    Allergies Allergies  Allergen Reactions   Phenergan [Promethazine] Other (See Comments)    Cramps, shakes.   Augmentin [Amoxicillin-Pot Clavulanate] Other (See Comments)    UNKNOWN [SEE BELOW]  Has patient had a PCN reaction causing immediate rash, facial/tongue/throat swelling, SOB or lightheadedness with hypotension: Unknown Has patient had a PCN reaction causing severe rash involving mucus membranes or skin necrosis: Uknown PATIENT HAS HAD A PCN REACTION THAT REQUIRED HOSPITALIZATION: >> UNSPECIFIED REACTION WHILE HE WAS ALREADY ADMITTED TO A HOSPITAL.  Has patient had a PCN reaction occurring within the last 10 years: No    Oxycodone Other (See Comments)    UNSPECIFIED REACTION  TOLERATES APAP WITHOUT OXYCODONE   Percocet [Oxycodone-Acetaminophen] Other (See Comments)    Unknown. Tolerates acetaminophen alone.    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   Sleep apnea    Uses O2 at 2L while sleeping; "can't tolerate CPAP OR BIPAP" * (04/17/2017)   Vertigo    Wears glasses    Past Surgical History:  Procedure Laterality Date   BACK SURGERY     BILIARY STENT PLACEMENT  12/10/2018   Procedure: BILIARY STENT PLACEMENT;  Surgeon: Irving Copas., MD;  Location: [facility];  Service: Gastroenterology;;   BIOPSY  12/10/2018   Procedure: BIOPSY;  Surgeon: Irving Copas., MD;  Location: [facility];  Service:  Gastroenterology;;   CARDIAC CATHETERIZATION     CHOLECYSTECTOMY     COLONOSCOPY     ENDOSCOPIC RETROGRADE CHOLANGIOPANCREATOGRAPHY (ERCP) WITH PROPOFOL N/A 12/10/2018   Procedure: ENDOSCOPIC RETROGRADE CHOLANGIOPANCREATOGRAPHY (ERCP) WITH PROPOFOL;  Surgeon: Rush Landmark Telford Nab., MD;  Location: [facility];  Service: Gastroenterology;  Laterality: N/A;  NEED  2 1/2 HOUR FOR CASE-JILL/PATTY   ESOPHAGOGASTRODUODENOSCOPY (EGD) WITH PROPOFOL N/A 12/10/2018   Procedure: ESOPHAGOGASTRODUODENOSCOPY (EGD) WITH PROPOFOL;  Surgeon: Rush Landmark Telford Nab., MD;  Location: [facility];  Service: Gastroenterology;  Laterality: N/A;   FRACTURE SURGERY     Left plates and screws   pain stimulator   2012   pain stimulator   2012   removed 2012   PARTIAL KNEE ARTHROPLASTY Right 04/17/2017   Procedure: UNICOMPARTMENTAL KNEE;  Surgeon: Vickey Huger, MD;  Location: [facility];  Service: Orthopedics;  Laterality: Right;   REMOVAL OF STONES  12/10/2018   Procedure: REMOVAL OF STONES;  Surgeon: Rush Landmark Telford Nab., MD;  Location: [facility];  Service: Gastroenterology;;   REPLACEMENT UNICONDYLAR  JOINT KNEE Right 04/17/2017   SPHINCTEROTOMY  12/10/2018   Procedure: Joan Mayans;  Surgeon: Mansouraty, Telford Nab., MD;  Location: [facility];  Service: Gastroenterology;;   Bess Kinds CHOLANGIOSCOPY N/A 12/10/2018   Procedure: CHENIDPO CHOLANGIOSCOPY;  Surgeon: Irving Copas., MD;  Location: [facility];  Service: Gastroenterology;  Laterality: N/A;   TONSILLECTOMY     UVULOPALATOPHARYNGOPLASTY (UPPP)/TONSILLECTOMY/SEPTOPLASTY     VASECTOMY     Social History   Socioeconomic History   Marital status: Married    Spouse name: Not on file   Number of children: 3   Years of education: Not on file   Highest education level: Not on file  Occupational History   Occupation: retired  Tobacco Use   Smoking status: Former    Types: Cigarettes    Quit date: 1966    Years since quitting: 56.8   Smokeless tobacco: Never   Tobacco comments:    quit smoking cigarettes in 1966  Vaping Use   Vaping Use: Never used  Substance and Sexual Activity   Alcohol use: No   Drug use: No   Sexual activity: Not on file  Other Topics Concern   Not on file  Social History Narrative   Not on file   Social Determinants of Health   Financial Resource Strain: Not on file  Food Insecurity: Not on file  Transportation Needs: Not on file  Physical Activity: Not on file  Stress: Not on file  Social Connections: Not on file  Intimate Partner Violence: Not on file   Family History  Problem Relation Age of Onset   Heart disease Mother    Heart disease Father    Breast cancer Sister    Heart disease Brother    Colon cancer Neg Hx    Esophageal cancer Neg Hx    Inflammatory bowel disease Neg Hx    Liver disease Neg Hx    Pancreatic cancer Neg Hx    Rectal cancer Neg Hx    Stomach cancer Neg Hx    I have reviewed his medical, social, and family history in detail and updated the electronic medical record as necessary.    PHYSICAL EXAMINATION  BP 100/80 (BP Location: Left Arm, Patient  Position: Sitting, Cuff Size: Normal)   Pulse (!) 56   Ht 5\' 7"  (1.702 m)   Wt 170 lb (77.1 kg)   BMI 26.63 kg/m  Wt Readings from Last 3 Encounters:  07/15/21 170 lb (77.1  kg)  03/19/20 175 lb 4 oz (79.5 kg)  06/25/19 162 lb 6 oz (73.7 kg)  GEN: Elderly, NAD, accompanied by son with wife on the phone PSYCH: Cooperative, without pressured speech EYE: Conjunctivae pink, sclerae anicteric ENT: MMM CV: Nontachycardic RESP: No audible wheezing GI: NABS, soft, mildly protuberant abdomen, well-healed surgical scar in upper abdomen, NT/ND, without rebound or guarding MSK/EXT: No significant lower extremity edema SKIN: No jaundice NEURO:  Alert & Oriented x 3, no focal deficits   REVIEW OF DATA  I reviewed the following data at the time of this encounter:  GI Procedures and Studies  No new relevant studies to review  Laboratory Studies  Reviewed those in epic  Imaging Studies  October 2022 MRI/MRCP Stable postoperative changes from cholecystectomy and hepaticojejunostomy. No evidence of recurrent or metastatic disease. Stable mild diffuse pancreatic ductal dilatation. Stable hiatal hernia and large colonic stool burden.   ASSESSMENT  Mr. Barco is a 80 y.o. 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  The patient is seen today for evaluation and management of:  1. 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 Dyspepsia   3. Chronic constipation   4. Status post biliary surgery    Patient is hemodynamically stable.  From his cholangiocarcinoma/biliary tract he seems to be overall clinically stable as well.  Patient is experiencing some potential dyspeptic-like symptoms.  We are going to increase his PPI therapy in an effort of trying to see  if this will help the patient over the course of the next few weeks.  If he does not have further clinical improvement we will consider Carafate therapy.  Patient at the time of my ERCP did have some significant grade C esophagitis but his pyrosis symptoms have been better while he has been on PPI therapy.  Time will tell whether we will need to consider repeat upper endoscopy.  We also discussed the potential role of 1 final colonoscopy for colon polyp surveillance but as a result of the patient's changes in his mental state and potential burgeoning dementia they would like to hold off on this as a result of wanting to minimize anesthesia risk and I agree with that at this time.  Because the pancreas duct has increased in size slightly, I think it would be important to reevaluate this area I recommend that instead of waiting a full year for his MRI in follow-up of his biliary tract cancer surveillance I would recommend we do that in 6 months.  I will see the patient back in approximately 3 to 4 months and we will see how they have done.  Time will tell.  All patient questions were answered to the best of my ability, and the patient agrees to the aforementioned plan of action with follow-up as indicated.     PLAN  Increase omeprazole to 20 mg twice daily After few weeks if patient still having issues then we will consider Carafate therapy Diagnostic endoscopy if symptoms persist will need to be considered though anesthesia risk is something we need to consider Family holding on repeat colonoscopy due to wanting to minimize anesthesia risk and possible Repeat MRI/MRCP in 6 months due to pancreatic duct dilation Continue dicyclomine as needed up to 4 times daily Continue MiraLAX once to twice daily   No orders of the defined types were  placed in this encounter.   New Prescriptions   No medications on file   Modified Medications   Modified Medication Previous Medication   OMEPRAZOLE (PRILOSEC) 20  MG CAPSULE omeprazole (PRILOSEC) 20 MG capsule      Take 1 capsule (20 mg total) by mouth 2 (two) times daily before a meal.    TAKE 1 CAPSULE BY MOUTH EVERY DAY    Planned Follow Up Return in about 4 months (around 11/15/2021).   Total Time in Face-to-Face and in Coordination of Care for patient including independent/personal interpretation/review of prior testing, medical history, examination, medication adjustment, communicating results with the patient directly, and documentation with the EHR is 25 minutes.   [name], MD [facility] Office # [phone]

## 2021-07-17 ENCOUNTER — Encounter: Payer: Self-pay | Admitting: Gastroenterology

## 2021-07-17 DIAGNOSIS — K5909 Other constipation: Secondary | ICD-10-CM | POA: Insufficient documentation

## 2021-07-17 DIAGNOSIS — R1013 Epigastric pain: Secondary | ICD-10-CM | POA: Insufficient documentation

## 2021-08-04 ENCOUNTER — Other Ambulatory Visit: Payer: Self-pay | Admitting: Gastroenterology

## 2021-11-13 ENCOUNTER — Other Ambulatory Visit: Payer: Self-pay

## 2021-11-13 ENCOUNTER — Emergency Department (HOSPITAL_COMMUNITY): Payer: Medicare Other

## 2021-11-13 ENCOUNTER — Encounter (HOSPITAL_COMMUNITY): Payer: Self-pay

## 2021-11-13 ENCOUNTER — Inpatient Hospital Stay (HOSPITAL_COMMUNITY)
Admission: EM | Admit: 2021-11-13 | Discharge: 2021-11-15 | DRG: 194 | Disposition: A | Discharge status: Home Health | Payer: Medicare Other | Attending: Internal Medicine | Admitting: Internal Medicine

## 2021-11-13 DIAGNOSIS — R531 Weakness: Secondary | ICD-10-CM | POA: Diagnosis present

## 2021-11-13 DIAGNOSIS — Z8509 Personal history of malignant neoplasm of other digestive organs: Secondary | ICD-10-CM | POA: Diagnosis not present

## 2021-11-13 DIAGNOSIS — Z7982 Long term (current) use of aspirin: Secondary | ICD-10-CM | POA: Diagnosis not present

## 2021-11-13 DIAGNOSIS — M419 Scoliosis, unspecified: Secondary | ICD-10-CM | POA: Diagnosis present

## 2021-11-13 DIAGNOSIS — Z8249 Family history of ischemic heart disease and other diseases of the circulatory system: Secondary | ICD-10-CM

## 2021-11-13 DIAGNOSIS — K589 Irritable bowel syndrome without diarrhea: Secondary | ICD-10-CM | POA: Diagnosis present

## 2021-11-13 DIAGNOSIS — Z66 Do not resuscitate: Secondary | ICD-10-CM | POA: Diagnosis not present

## 2021-11-13 DIAGNOSIS — Z881 Allergy status to other antibiotic agents status: Secondary | ICD-10-CM

## 2021-11-13 DIAGNOSIS — F0390 Unspecified dementia without behavioral disturbance: Secondary | ICD-10-CM | POA: Diagnosis present

## 2021-11-13 DIAGNOSIS — Z888 Allergy status to other drugs, medicaments and biological substances status: Secondary | ICD-10-CM

## 2021-11-13 DIAGNOSIS — Z87891 Personal history of nicotine dependence: Secondary | ICD-10-CM | POA: Diagnosis not present

## 2021-11-13 DIAGNOSIS — I248 Other forms of acute ischemic heart disease: Secondary | ICD-10-CM | POA: Diagnosis present

## 2021-11-13 DIAGNOSIS — R569 Unspecified convulsions: Secondary | ICD-10-CM | POA: Diagnosis present

## 2021-11-13 DIAGNOSIS — G2581 Restless legs syndrome: Secondary | ICD-10-CM | POA: Diagnosis not present

## 2021-11-13 DIAGNOSIS — Z885 Allergy status to narcotic agent status: Secondary | ICD-10-CM

## 2021-11-13 DIAGNOSIS — K219 Gastro-esophageal reflux disease without esophagitis: Secondary | ICD-10-CM | POA: Diagnosis not present

## 2021-11-13 DIAGNOSIS — Z9981 Dependence on supplemental oxygen: Secondary | ICD-10-CM | POA: Diagnosis not present

## 2021-11-13 DIAGNOSIS — Z20822 Contact with and (suspected) exposure to covid-19: Secondary | ICD-10-CM | POA: Diagnosis present

## 2021-11-13 DIAGNOSIS — E785 Hyperlipidemia, unspecified: Secondary | ICD-10-CM | POA: Diagnosis not present

## 2021-11-13 DIAGNOSIS — G4730 Sleep apnea, unspecified: Secondary | ICD-10-CM | POA: Diagnosis not present

## 2021-11-13 DIAGNOSIS — Z79899 Other long term (current) drug therapy: Secondary | ICD-10-CM

## 2021-11-13 DIAGNOSIS — J189 Pneumonia, unspecified organism: Secondary | ICD-10-CM | POA: Diagnosis not present

## 2021-11-13 DIAGNOSIS — I82401 Acute embolism and thrombosis of unspecified deep veins of right lower extremity: Secondary | ICD-10-CM | POA: Diagnosis present

## 2021-11-13 LAB — URINALYSIS, ROUTINE W REFLEX MICROSCOPIC
Bilirubin Urine: NEGATIVE
Glucose, UA: NEGATIVE mg/dL
Hgb urine dipstick: NEGATIVE
Ketones, ur: NEGATIVE mg/dL
Leukocytes,Ua: NEGATIVE
Nitrite: NEGATIVE
Protein, ur: NEGATIVE mg/dL
Specific Gravity, Urine: 1.015 (ref 1.005–1.030)
pH: 8 (ref 5.0–8.0)

## 2021-11-13 LAB — CBC WITH DIFFERENTIAL/PLATELET
Abs Immature Granulocytes: 0.09 10*3/uL — ABNORMAL HIGH (ref 0.00–0.07)
Basophils Absolute: 0 10*3/uL (ref 0.0–0.1)
Basophils Relative: 0 %
Eosinophils Absolute: 0 10*3/uL (ref 0.0–0.5)
Eosinophils Relative: 0 %
HCT: 36.3 % — ABNORMAL LOW (ref 39.0–52.0)
Hemoglobin: 11.8 g/dL — ABNORMAL LOW (ref 13.0–17.0)
Immature Granulocytes: 1 %
Lymphocytes Relative: 2 %
Lymphs Abs: 0.3 10*3/uL — ABNORMAL LOW (ref 0.7–4.0)
MCH: 33.1 pg (ref 26.0–34.0)
MCHC: 32.5 g/dL (ref 30.0–36.0)
MCV: 101.7 fL — ABNORMAL HIGH (ref 80.0–100.0)
Monocytes Absolute: 0.4 10*3/uL (ref 0.1–1.0)
Monocytes Relative: 3 %
Neutro Abs: 14.8 10*3/uL — ABNORMAL HIGH (ref 1.7–7.7)
Neutrophils Relative %: 94 %
Platelets: 148 10*3/uL — ABNORMAL LOW (ref 150–400)
RBC: 3.57 MIL/uL — ABNORMAL LOW (ref 4.22–5.81)
RDW: 12.5 % (ref 11.5–15.5)
WBC: 15.7 10*3/uL — ABNORMAL HIGH (ref 4.0–10.5)
nRBC: 0 % (ref 0.0–0.2)

## 2021-11-13 LAB — COMPREHENSIVE METABOLIC PANEL
ALT: 27 U/L (ref 0–44)
AST: 32 U/L (ref 15–41)
Albumin: 3.7 g/dL (ref 3.5–5.0)
Alkaline Phosphatase: 99 U/L (ref 38–126)
Anion gap: 6 (ref 5–15)
BUN: 26 mg/dL — ABNORMAL HIGH (ref 8–23)
CO2: 30 mmol/L (ref 22–32)
Calcium: 8.8 mg/dL — ABNORMAL LOW (ref 8.9–10.3)
Chloride: 98 mmol/L (ref 98–111)
Creatinine, Ser: 0.66 mg/dL (ref 0.61–1.24)
GFR, Estimated: >60 mL/min (ref >60)
Glucose, Bld: 99 mg/dL (ref 70–99)
Potassium: 4 mmol/L (ref 3.5–5.1)
Sodium: 134 mmol/L — ABNORMAL LOW (ref 135–145)
Total Bilirubin: 0.6 mg/dL (ref 0.3–1.2)
Total Protein: 6.8 g/dL (ref 6.5–8.1)

## 2021-11-13 LAB — TROPONIN I (HIGH SENSITIVITY)
Troponin I (High Sensitivity): 54 ng/L — ABNORMAL HIGH (ref <18)
Troponin I (High Sensitivity): 57 ng/L — ABNORMAL HIGH (ref <18)

## 2021-11-13 LAB — RESP PANEL BY RT-PCR (FLU A&B, COVID) ARPGX2
Influenza A by PCR: NEGATIVE
Influenza B by PCR: NEGATIVE
SARS Coronavirus 2 by RT PCR: NEGATIVE

## 2021-11-13 LAB — LACTIC ACID, PLASMA: Lactic Acid, Venous: 0.8 mmol/L (ref 0.5–1.9)

## 2021-11-13 MED ORDER — SODIUM CHLORIDE 0.9 % IV BOLUS
500.0000 mL | Freq: Once | INTRAVENOUS | Status: AC
  Administered 2021-11-13: 500 mL via INTRAVENOUS

## 2021-11-13 MED ORDER — SODIUM CHLORIDE 0.9 % IV SOLN
2.0000 g | Freq: Once | INTRAVENOUS | Status: AC
  Administered 2021-11-13: 2 g via INTRAVENOUS
  Filled 2021-11-13: qty 20

## 2021-11-13 MED ORDER — HYDROCODONE-ACETAMINOPHEN 5-325 MG PO TABS
1.0000 | ORAL_TABLET | ORAL | Status: DC | PRN
  Administered 2021-11-14 (×4): 1 via ORAL
  Filled 2021-11-13 (×4): qty 1

## 2021-11-13 MED ORDER — ACETAMINOPHEN 325 MG PO TABS: 650.0000 mg | ORAL_TABLET | Freq: Four times a day (QID) | ORAL | Status: DC | PRN

## 2021-11-13 MED ORDER — ONDANSETRON HCL 4 MG PO TABS: 4.0000 mg | ORAL_TABLET | Freq: Four times a day (QID) | ORAL | Status: DC | PRN

## 2021-11-13 MED ORDER — SODIUM CHLORIDE 0.9 % IV SOLN
2.0000 g | INTRAVENOUS | Status: DC
  Administered 2021-11-14: 2 g via INTRAVENOUS
  Filled 2021-11-13: qty 20

## 2021-11-13 MED ORDER — ONDANSETRON HCL 4 MG/2ML IJ SOLN: 4.0000 mg | Freq: Four times a day (QID) | INTRAMUSCULAR | Status: DC | PRN

## 2021-11-13 MED ORDER — POLYETHYLENE GLYCOL 3350 17 G PO PACK
17.0000 g | PACK | Freq: Every day | ORAL | Status: DC
  Administered 2021-11-13 – 2021-11-14 (×2): 17 g via ORAL
  Filled 2021-11-13 (×3): qty 1

## 2021-11-13 MED ORDER — ACETAMINOPHEN 650 MG RE SUPP: 650.0000 mg | Freq: Four times a day (QID) | RECTAL | Status: DC | PRN

## 2021-11-13 MED ORDER — SODIUM CHLORIDE 0.9 % IV SOLN
500.0000 mg | Freq: Once | INTRAVENOUS | Status: AC
  Administered 2021-11-13: 500 mg via INTRAVENOUS
  Filled 2021-11-13: qty 5

## 2021-11-13 MED ORDER — FLUVOXAMINE MALEATE ER 100 MG PO CP24: 1.0000 | ORAL_CAPSULE | Freq: Once | ORAL | Status: DC

## 2021-11-13 MED ORDER — SIMVASTATIN 10 MG PO TABS
40.0000 mg | ORAL_TABLET | Freq: Every day | ORAL | Status: DC
  Administered 2021-11-13 – 2021-11-14 (×2): 40 mg via ORAL
  Filled 2021-11-13 (×2): qty 4

## 2021-11-13 MED ORDER — ENOXAPARIN SODIUM 40 MG/0.4ML IJ SOSY
40.0000 mg | PREFILLED_SYRINGE | INTRAMUSCULAR | Status: DC
  Administered 2021-11-13 – 2021-11-14 (×2): 40 mg via SUBCUTANEOUS
  Filled 2021-11-13 (×2): qty 0.4

## 2021-11-13 MED ORDER — CELECOXIB 100 MG PO CAPS
200.0000 mg | ORAL_CAPSULE | Freq: Every evening | ORAL | Status: DC
  Administered 2021-11-13 – 2021-11-14 (×2): 200 mg via ORAL
  Filled 2021-11-13 (×2): qty 2

## 2021-11-13 MED ORDER — SODIUM CHLORIDE 0.9 % IV SOLN: 500.0000 mg | INTRAVENOUS | Status: DC

## 2021-11-13 MED ORDER — ASPIRIN EC 81 MG PO TBEC
81.0000 mg | DELAYED_RELEASE_TABLET | Freq: Every day | ORAL | Status: DC
  Administered 2021-11-13 – 2021-11-15 (×3): 81 mg via ORAL
  Filled 2021-11-13 (×3): qty 1

## 2021-11-13 MED ORDER — PHENOBARBITAL 32.4 MG PO TABS
64.8000 mg | ORAL_TABLET | Freq: Every day | ORAL | Status: DC
  Administered 2021-11-14 – 2021-11-15 (×2): 64.8 mg via ORAL
  Filled 2021-11-13 (×2): qty 2

## 2021-11-13 MED ORDER — ROPINIROLE HCL 1 MG PO TABS
3.0000 mg | ORAL_TABLET | Freq: Three times a day (TID) | ORAL | Status: DC
Start: 2021-11-13 — End: 2021-11-16
  Administered 2021-11-13 – 2021-11-15 (×5): 3 mg via ORAL
  Filled 2021-11-13 (×5): qty 3

## 2021-11-13 MED ORDER — INFLUENZA VAC A&B SA ADJ QUAD 0.5 ML IM PRSY: 0.5000 mL | PREFILLED_SYRINGE | INTRAMUSCULAR | Status: DC | PRN

## 2021-11-13 MED ORDER — PANTOPRAZOLE SODIUM 40 MG PO TBEC
40.0000 mg | DELAYED_RELEASE_TABLET | Freq: Every day | ORAL | Status: DC
  Administered 2021-11-13 – 2021-11-14 (×2): 40 mg via ORAL
  Filled 2021-11-13 (×2): qty 1

## 2021-11-13 NOTE — ED Notes (Signed)
Edema noted to left eye and pt unaware how Samuel Olson his eye has been that way. Bilateral swelling to legs with brown discoloration. Pt verbalized he walks with a walker at home.

## 2021-11-13 NOTE — Progress Notes (Signed)
[facility] following for sepsis protocol. 

## 2021-11-13 NOTE — H&P (Signed)
History and Physical    Patient: Samuel Olson QMG:[id number] DOB: April 05, 1941 DOA: 11/13/2021 DOS: the patient was seen and examined on 11/13/2021 PCP: Elyn Aquas  Patient coming from: Home  Chief Complaint:  Chief Complaint  Patient presents with   Weakness    HPI: Samuel Olson is a 81 y.o. male with medical history significant of GERD, sleep apnea on 2 L nasal cannula at night, IBS, hyperlipidemia, possible early dementia.  Patient seen for fevers, chills, cough that started last night.  Patient had poor sleep due to the fevers and chills.  He awoke, he was noted to be weak and had difficulty walking and he fell at home.  No palliating or provoking factors.  He was brought to the hospital for evaluation.  Here, the patient had a temperature of 99.4.  Chest x-ray shows probable pneumonia.  Count elevated to 15.7 with normal lactic acid.  Patient started on antibiotics.  Review of Systems: As mentioned in the history of present illness. All other systems reviewed and are negative. 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   Sleep apnea    Uses O2 at 2L while sleeping; "can't tolerate CPAP OR BIPAP" * (04/17/2017)   Vertigo    Wears glasses    Past Surgical History:  Procedure Laterality Date   BACK SURGERY     BILIARY STENT PLACEMENT  12/10/2018   Procedure: BILIARY STENT PLACEMENT;  Surgeon: Irving Copas., MD;  Location: [place];  Service: Gastroenterology;;   BIOPSY  12/10/2018   Procedure: BIOPSY;   Surgeon: Irving Copas., MD;  Location[facility];  Service: Gastroenterology;;   CARDIAC CATHETERIZATION     CHOLECYSTECTOMY     COLONOSCOPY     ENDOSCOPIC RETROGRADE CHOLANGIOPANCREATOGRAPHY (ERCP) WITH PROPOFOL N/A 12/10/2018   Procedure: ENDOSCOPIC RETROGRADE CHOLANGIOPANCREATOGRAPHY (ERCP) WITH PROPOFOL;  Surgeon: Irving Copas., MD;  Location: [place];  Service: Gastroenterology;  Laterality: N/A;  NEED  2 1/2 HOUR FOR CASE-JILL/PATTY   ESOPHAGOGASTRODUODENOSCOPY (EGD) WITH PROPOFOL N/A 12/10/2018   Procedure: ESOPHAGOGASTRODUODENOSCOPY (EGD) WITH PROPOFOL;  Surgeon: Rush Landmark Telford Nab., MD;  Location: [place];  Service: Gastroenterology;  Laterality: N/A;   FRACTURE SURGERY     Left plates and screws   pain stimulator   2012   pain stimulator   2012   removed 2012   PARTIAL KNEE ARTHROPLASTY Right 04/17/2017   Procedure: UNICOMPARTMENTAL KNEE;  Surgeon: Vickey Huger, MD;  Location: [place];  Service: Orthopedics;  Laterality: Right;   REMOVAL OF STONES  12/10/2018   Procedure: REMOVAL OF STONES;  Surgeon: Rush Landmark Telford Nab., MD;  Location: [place];  Service: Gastroenterology;;   REPLACEMENT UNICONDYLAR JOINT KNEE Right 04/17/2017   SPHINCTEROTOMY  12/10/2018   Procedure: Joan Mayans;  Surgeon: Mansouraty, Telford Nab., MD;  Location: [place];  Service: Gastroenterology;;   Bess Kinds CHOLANGIOSCOPY N/A 12/10/2018   Procedure:  SPYGLASS CHOLANGIOSCOPY;  Surgeon: Rush Landmark Telford Nab., MD;  Location: [place];  Service: Gastroenterology;  Laterality: N/A;   TONSILLECTOMY     UVULOPALATOPHARYNGOPLASTY (UPPP)/TONSILLECTOMY/SEPTOPLASTY     VASECTOMY     Social History:  reports that he quit smoking about 57 years ago. His smoking use included cigarettes. He has never used smokeless tobacco. He reports that he does not drink alcohol and does not use drugs.  Allergies  Allergen Reactions   Phenergan [Promethazine] Other (See Comments)     Cramps, shakes.   Augmentin [Amoxicillin-Pot Clavulanate] Other (See Comments)    UNKNOWN [SEE BELOW]  Has patient had a PCN reaction causing immediate rash, facial/tongue/throat swelling, SOB or lightheadedness with hypotension: Unknown Has patient had a PCN reaction causing severe rash involving mucus membranes or skin necrosis: Uknown PATIENT HAS HAD A PCN REACTION THAT REQUIRED HOSPITALIZATION: >> UNSPECIFIED REACTION WHILE HE WAS ALREADY ADMITTED TO A HOSPITAL.  Has patient had a PCN reaction occurring within the last 10 years: No    Oxycodone Other (See Comments)    UNSPECIFIED REACTION  TOLERATES APAP WITHOUT OXYCODONE   Percocet [Oxycodone-Acetaminophen] Other (See Comments)    Unknown. Tolerates acetaminophen alone.    Family History  Problem Relation Age of Onset   Heart disease Mother    Heart disease Father    Breast cancer Sister    Heart disease Brother    Colon cancer Neg Hx    Esophageal cancer Neg Hx    Inflammatory bowel disease Neg Hx    Liver disease Neg Hx    Pancreatic cancer Neg Hx    Rectal cancer Neg Hx    Stomach cancer Neg Hx     Prior to Admission medications   Medication Sig Start Date End Date Taking? Authorizing Provider  aspirin EC 81 MG tablet Take 81 mg by mouth daily. Swallow whole.   Yes [provider]  CALCIUM CITRATE PO Take 1-2 tablets by mouth daily.   Yes [provider]  celecoxib (CELEBREX) 200 MG capsule Take 200 mg by mouth every evening. 03/20/17  Yes [provider]  Cholecalciferol (VITAMIN D3 PO) Take 1,500 Units by mouth every morning.    Yes [provider]  FERROUS SULFATE PO Take 270 mg by mouth daily.   Yes [provider]  FISH OIL-VITAMIN D PO Take 1,400 mg by mouth 2 (two) times a day.   Yes [provider]  Fluvoxamine Maleate 100 MG CP24 Take 1 capsule by mouth once.   Yes [provider]  Glucosamine-Chondroit-Vit C-Mn (GLUCOSAMINE 1500 COMPLEX PO) Take 1  capsule by mouth 2 (two) times daily.   Yes [provider]  HYDROcodone-acetaminophen (NORCO/VICODIN) 5-325 MG tablet Take 1 tablet by mouth every 4 (four) hours as needed. Patient taking differently: Take 1-2 tablets by mouth every 4 (four) hours as needed. 04/10/19  Yes Idol, Almyra Free, PA-C  Multiple Vitamin (MULTIVITAMIN WITH MINERALS) TABS tablet Take 1 tablet by mouth daily.   Yes [provider]  Multiple Vitamins-Minerals (PRESERVISION AREDS PO) Take 1 tablet by mouth 2 (two) times daily.   Yes [provider]  omeprazole (PRILOSEC) 20 MG capsule TAKE 1 CAPSULE (20 MG TOTAL) BY MOUTH 2 (TWO) TIMES DAILY BEFORE A MEAL. 08/04/21  Yes Mansouraty, Telford Nab., MD  OXYGEN Inhale 2 L into the lungs at bedtime.   Yes [provider]  PHENobarbital (LUMINAL) 64.8 MG tablet Take 64.8 mg by mouth daily after breakfast. 02/27/17  Yes [provider]  polycarbophil (FIBERCON) 625 MG tablet Take 1,400 mg by mouth 2 (two) times daily.    Yes [provider]  polyethylene glycol (MIRALAX / GLYCOLAX) 17 g packet Take 17 g by mouth daily.    Yes [provider]  rOPINIRole (REQUIP) 0.25 MG tablet Take 3 mg by mouth 3 (three) times daily.   Yes [provider]  simvastatin (ZOCOR) 40 MG tablet Take 40 mg by mouth at bedtime. for cholesterol 02/25/17  Yes [provider]  SUPER B COMPLEX/C PO Take 1 tablet by mouth every morning.    Yes [provider]  mupirocin ointment (BACTROBAN) 2 % Apply topically 2 (two) times daily. Patient not taking: Reported on 11/13/2021 08/30/21   [provider]    Physical Exam: Vitals:   11/13/21 1600 11/13/21 1630 11/13/21 1700 11/13/21 1730  BP: 119/66 126/66 122/63 108/60  Pulse: 80 80 81 78  Resp: 17 18 19 15   Temp:      TempSrc:      SpO2: 95% 97% 94% 94%  Weight:      Height:       General: Elderly male. Awake and alert and oriented x3. No acute cardiopulmonary distress.   HEENT: Normocephalic atraumatic.  Right and left ears normal in appearance.  Pupils equal, round, reactive to light. Extraocular muscles are intact. Sclerae anicteric and noninjected.  Moist mucosal membranes. No mucosal lesions.  Neck: Neck supple without lymphadenopathy. No carotid bruits. No masses palpated.  Cardiovascular: Regular rate with normal S1-S2 sounds. No murmurs, rubs, gallops auscultated. No JVD.  Respiratory: Rales on the right side.  No accessory muscle use. Abdomen: Soft, nontender, nondistended. Active bowel sounds. No masses or hepatosplenomegaly  Skin: No rashes, lesions, or ulcerations.  Dry, warm to touch. 2+ dorsalis pedis and radial pulses. Musculoskeletal: No calf or leg pain. All major joints not erythematous nontender.  No upper or lower joint deformation.  Good ROM.  No contractures  Psychiatric: Intact judgment and insight. Pleasant and cooperative. Neurologic: No focal neurological deficits. Strength is 5/5 and symmetric in upper and lower extremities.  Cranial nerves II through XII are grossly intact.   Data Reviewed: DG Chest 1 View  Result Date: 11/13/2021 CLINICAL DATA:  Weakness, fall EXAM: CHEST  1 VIEW COMPARISON:  06/25/2019 FINDINGS: Patient is rotated. Heart size within normal limits. Aorta is calcified and tortuous. Hyperinflated lungs with coarsened interstitial markings bilaterally, similar in appearance to prior. Streaky left basilar interstitial opacities. Minimally increased interstitial opacities within the right lung. No pleural effusion or pneumothorax. IMPRESSION: Streaky left basilar opacity and minimally increased interstitial opacities within the right lung, which may represent progressive interstitial lung disease or superimposed multifocal infection. Electronically Signed   By: Davina Poke D.O.   On: 11/13/2021 16:38   CT Head Wo Contrast  Result Date: 11/13/2021 CLINICAL DATA:  Dizziness, persistent/recurrent. Cardiac or vascular  cause suspected. Fell today. EXAM: CT HEAD WITHOUT CONTRAST TECHNIQUE: Contiguous axial images were obtained from the base of the skull through the vertex without intravenous contrast. RADIATION DOSE REDUCTION: This exam was performed according to the departmental dose-optimization program which includes automated exposure control, adjustment of the mA and/or kV according to patient size and/or use of iterative reconstruction technique. COMPARISON:  None. FINDINGS: Brain: Mild age related atrophy. Mild chronic small-vessel ischemic changes of the white matter. No evidence of acute infarction, mass lesion, hemorrhage, hydrocephalus or extra-axial collection. Vascular: No abnormal vascular finding. Skull: Normal Sinuses/Orbits: Clear/normal Other: Fluid in the middle  ear on the left.  Left mastoid effusion. IMPRESSION: Normal appearance of the brain for age. Fluid in the middle ear on the left. Fluid throughout the mastoid air cells on the left. This could possibly relate to dizziness. Electronically Signed   By: Nelson Chimes M.D.   On: 11/13/2021 16:12   DG Knee Complete 4 Views Right  Result Date: 11/13/2021 CLINICAL DATA:  Weakness.  Fall EXAM: RIGHT KNEE - COMPLETE 4+ VIEW COMPARISON:  None. FINDINGS: Postsurgical changes of medial compartment arthroplasty. Arthroplasty components are in their expected alignment without perihardware lucency or fracture. Bones are demineralized. No evidence of fracture or malalignment. Trace joint effusion. Soft tissues within normal limits. IMPRESSION: 1. No acute osseous abnormality. 2. Medial compartment arthroplasty without evidence of hardware complication. Electronically Signed   By: Davina Poke D.O.   On: 11/13/2021 16:43   DG Hip Unilat W or Wo Pelvis 2-3 Views Right  Result Date: 11/13/2021 CLINICAL DATA:  Weakness EXAM: DG HIP (WITH OR WITHOUT PELVIS) 2-3V RIGHT COMPARISON:  04/10/2019 FINDINGS: Bones are demineralized. There is no evidence of hip fracture or  dislocation. Lumbosacral fusion hardware. There is no evidence of arthropathy or other focal bone abnormality. IMPRESSION: Negative. Electronically Signed   By: Davina Poke D.O.   On: 11/13/2021 16:44   Results for orders placed or performed during the hospital encounter of 11/13/21 (from the past 24 hour(s))  CBC with Differential/Platelet     Status: Abnormal   Collection Time: 11/13/21  3:16 PM  Result Value Ref Range   WBC 15.7 (H) 4.0 - 10.5 K/uL   RBC 3.57 (L) 4.22 - 5.81 MIL/uL   Hemoglobin 11.8 (L) 13.0 - 17.0 g/dL   HCT 36.3 (L) 39.0 - 52.0 %   MCV 101.7 (H) 80.0 - 100.0 fL   MCH 33.1 26.0 - 34.0 pg   MCHC 32.5 30.0 - 36.0 g/dL   RDW 12.5 11.5 - 15.5 %   Platelets 148 (L) 150 - 400 K/uL   nRBC 0.0 0.0 - 0.2 %   Neutrophils Relative % 94 %   Neutro Abs 14.8 (H) 1.7 - 7.7 K/uL   Lymphocytes Relative 2 %   Lymphs Abs 0.3 (L) 0.7 - 4.0 K/uL   Monocytes Relative 3 %   Monocytes Absolute 0.4 0.1 - 1.0 K/uL   Eosinophils Relative 0 %   Eosinophils Absolute 0.0 0.0 - 0.5 K/uL   Basophils Relative 0 %   Basophils Absolute 0.0 0.0 - 0.1 K/uL   Immature Granulocytes 1 %   Abs Immature Granulocytes 0.09 (H) 0.00 - 0.07 K/uL  Comprehensive metabolic panel     Status: Abnormal   Collection Time: 11/13/21  3:16 PM  Result Value Ref Range   Sodium 134 (L) 135 - 145 mmol/L   Potassium 4.0 3.5 - 5.1 mmol/L   Chloride 98 98 - 111 mmol/L   CO2 30 22 - 32 mmol/L   Glucose, Bld 99 70 - 99 mg/dL   BUN 26 (H) 8 - 23 mg/dL   Creatinine, Ser 0.66 0.61 - 1.24 mg/dL   Calcium 8.8 (L) 8.9 - 10.3 mg/dL   Total Protein 6.8 6.5 - 8.1 g/dL   Albumin 3.7 3.5 - 5.0 g/dL   AST 32 15 - 41 U/L   ALT 27 0 - 44 U/L   Alkaline Phosphatase 99 38 - 126 U/L   Total Bilirubin 0.6 0.3 - 1.2 mg/dL   GFR, Estimated >60 >60 mL/min   Anion gap 6  5 - 15  Troponin I (High Sensitivity)     Status: Abnormal   Collection Time: 11/13/21  3:16 PM  Result Value Ref Range   Troponin I (High Sensitivity) 54 (H)  <18 ng/L  Urinalysis, Routine w reflex microscopic Urine, Clean Catch     Status: None   Collection Time: 11/13/21  3:17 PM  Result Value Ref Range   Color, Urine YELLOW YELLOW   APPearance CLEAR CLEAR   Specific Gravity, Urine 1.015 1.005 - 1.030   pH 8.0 5.0 - 8.0   Glucose, UA NEGATIVE NEGATIVE mg/dL   Hgb urine dipstick NEGATIVE NEGATIVE   Bilirubin Urine NEGATIVE NEGATIVE   Ketones, ur NEGATIVE NEGATIVE mg/dL   Protein, ur NEGATIVE NEGATIVE mg/dL   Nitrite NEGATIVE NEGATIVE   Leukocytes,Ua NEGATIVE NEGATIVE  Lactic acid, plasma     Status: None   Collection Time: 11/13/21  3:17 PM  Result Value Ref Range   Lactic Acid, Venous 0.8 0.5 - 1.9 mmol/L  Resp Panel by RT-PCR (Flu A&B, Covid) Nasopharyngeal Swab     Status: None   Collection Time: 11/13/21  3:27 PM   Specimen: Nasopharyngeal Swab; Nasopharyngeal(NP) swabs in vial transport medium  Result Value Ref Range   SARS Coronavirus 2 by RT PCR NEGATIVE NEGATIVE   Influenza A by PCR NEGATIVE NEGATIVE   Influenza B by PCR NEGATIVE NEGATIVE  Troponin I (High Sensitivity)     Status: Abnormal   Collection Time: 11/13/21  5:07 PM  Result Value Ref Range   Troponin I (High Sensitivity) 57 (H) <18 ng/L     Assessment and Plan: Principal Problem:   CAP (community acquired pneumonia) Active Problems:   Deep vein thrombosis (DVT) of right lower extremity (HCC)   Gastroesophageal reflux disease   On home oxygen therapy   Sleep apnea  Commune acquired pneumonia Antibiotics: rocephin and azithromycin Robitussin Blood cultures drawn in the emergency department Sputum cultures CBC tomorrow Strep and Legionella antigen by urine Influenza and COVID screen neg History of sleep apnea on home oxygen O2 at night 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 Inpatient status is judged to be reasonable and necessary in order to provide the required intensity of service to ensure the patient's safety. The patient's presenting symptoms, physical exam findings, and initial radiographic and laboratory data in the context of their chronic comorbidities is felt to place them at high risk for further clinical deterioration. Furthermore, it is not anticipated that the patient will be medically stable for discharge from the hospital within 2 midnights of admission.   * I certify that at the point of admission it is my clinical judgment that the patient will require inpatient hospital care spanning beyond 2 midnights from the point of admission due to high intensity of service, high risk for further deterioration and high frequency of surveillance required.*  Author: Truett Mainland, DO 11/13/2021 6:39 PM  For on call review [email or web address].

## 2021-11-13 NOTE — ED Triage Notes (Signed)
Pt arrived [facility] with c/o weakness. Pt stated he did fall today from weakness, no pain noted. Pt lives at home.

## 2021-11-13 NOTE — ED Notes (Signed)
Pt verbalized he is a DNR

## 2021-11-13 NOTE — ED Provider Notes (Signed)
[facility] EMERGENCY DEPARTMENT Provider Note   CSN: [id number] Arrival date & time: 11/13/21  1417     History  Chief Complaint  Patient presents with   Weakness    Samuel Olson is a 81 y.o. male.  Patient has a history of seizures and scoliosis.  He presented today with fevers and chills and cough and weakness.  The history is provided by the patient and medical records. No language interpreter was used.  Weakness Severity:  Moderate Onset quality:  Sudden Timing:  Constant Progression:  Partially resolved Context: not alcohol use   Relieved by:  Nothing Worsened by:  Nothing Ineffective treatments:  None tried Associated symptoms: cough   Associated symptoms: no abdominal pain, no chest pain, no diarrhea, no frequency, no headaches and no seizures       Home Medications Prior to Admission medications   Medication Sig Start Date End Date Taking? Authorizing Provider  aspirin EC 81 MG tablet Take 81 mg by mouth daily. Swallow whole.   Yes [provider]  CALCIUM CITRATE PO Take 1-2 tablets by mouth daily.   Yes [provider]  celecoxib (CELEBREX) 200 MG capsule Take 200 mg by mouth every evening. 03/20/17  Yes [provider]  Cholecalciferol (VITAMIN D3 PO) Take 1,500 Units by mouth every morning.    Yes [provider]  FERROUS SULFATE PO Take 270 mg by mouth daily.   Yes [provider]  FISH OIL-VITAMIN D PO Take 1,400 mg by mouth 2 (two) times a day.   Yes [provider]  Fluvoxamine Maleate 100 MG CP24 Take 1 capsule by mouth once.   Yes [provider]  Glucosamine-Chondroit-Vit C-Mn (GLUCOSAMINE 1500 COMPLEX PO) Take 1 capsule by mouth 2 (two) times daily.   Yes [provider]  HYDROcodone-acetaminophen (NORCO/VICODIN) 5-325 MG tablet Take 1 tablet by mouth every 4 (four) hours as needed. Patient taking differently: Take 1-2 tablets by mouth every 4 (four) hours as needed. 04/10/19  Yes  Idol, Almyra Free, PA-C  Multiple Vitamin (MULTIVITAMIN WITH MINERALS) TABS tablet Take 1 tablet by mouth daily.   Yes [provider]  Multiple Vitamins-Minerals (PRESERVISION AREDS PO) Take 1 tablet by mouth 2 (two) times daily.   Yes [provider]  omeprazole (PRILOSEC) 20 MG capsule TAKE 1 CAPSULE (20 MG TOTAL) BY MOUTH 2 (TWO) TIMES DAILY BEFORE A MEAL. 08/04/21  Yes Mansouraty, Telford Nab., MD  OXYGEN Inhale 2 L into the lungs at bedtime.   Yes [provider]  PHENobarbital (LUMINAL) 64.8 MG tablet Take 64.8 mg by mouth daily after breakfast. 02/27/17  Yes [provider]  polycarbophil (FIBERCON) 625 MG tablet Take 1,400 mg by mouth 2 (two) times daily.    Yes [provider]  polyethylene glycol (MIRALAX / GLYCOLAX) 17 g packet Take 17 g by mouth daily.    Yes [provider]  rOPINIRole (REQUIP) 0.25 MG tablet Take 3 mg by mouth 3 (three) times daily.   Yes [provider]  simvastatin (ZOCOR) 40 MG tablet Take 40 mg by mouth at bedtime. for cholesterol 02/25/17  Yes [provider]  SUPER B COMPLEX/C PO Take 1 tablet by mouth every morning.    Yes [provider]  mupirocin ointment (BACTROBAN) 2 % Apply topically 2 (two) times daily. Patient not taking: Reported on 11/13/2021 08/30/21   [provider]      Allergies    Phenergan [promethazine], Augmentin [amoxicillin-pot clavulanate], Oxycodone, and Percocet [  oxycodone-acetaminophen]    Review of Systems   Review of Systems  Constitutional:  Negative for appetite change and fatigue.  HENT:  Negative for congestion, ear discharge and sinus pressure.   Eyes:  Negative for discharge.  Respiratory:  Positive for cough.   Cardiovascular:  Negative for chest pain.  Gastrointestinal:  Negative for abdominal pain and diarrhea.  Genitourinary:  Negative for frequency and hematuria.  Musculoskeletal:  Negative for back pain.  Skin:  Negative for rash.   Neurological:  Positive for weakness. Negative for seizures and headaches.  Psychiatric/Behavioral:  Negative for hallucinations.    Physical Exam Updated Vital Signs BP 108/60    Pulse 78    Temp 99.4 F (37.4 C) (Oral)    Resp 15    Ht 5\' 7"  (1.702 m)    Wt 74.8 kg    SpO2 94%    BMI 25.84 kg/m  Physical Exam Vitals and nursing note reviewed.  Constitutional:      Appearance: He is well-developed.  HENT:     Head: Normocephalic.  Eyes:     General: No scleral icterus.    Conjunctiva/sclera: Conjunctivae normal.  Neck:     Thyroid: No thyromegaly.  Cardiovascular:     Rate and Rhythm: Normal rate and regular rhythm.     Heart sounds: No murmur heard.   No friction rub. No gallop.  Pulmonary:     Breath sounds: No stridor. No wheezing or rales.  Chest:     Chest wall: No tenderness.  Abdominal:     General: There is no distension.     Tenderness: There is no abdominal tenderness. There is no rebound.  Musculoskeletal:        General: Normal range of motion.     Cervical back: Neck supple.  Lymphadenopathy:     Cervical: No cervical adenopathy.  Skin:    Findings: No erythema or rash.  Neurological:     Mental Status: He is alert and oriented to person, place, and time.     Motor: No abnormal muscle tone.     Coordination: Coordination normal.  Psychiatric:        Behavior: Behavior normal.    ED Results / Procedures / Treatments   Labs (all labs ordered are listed, but only abnormal results are displayed) Labs Reviewed  CBC WITH DIFFERENTIAL/PLATELET - Abnormal; Notable for the following components:      Result Value   WBC 15.7 (*)    RBC 3.57 (*)    Hemoglobin 11.8 (*)    HCT 36.3 (*)    MCV 101.7 (*)    Platelets 148 (*)    Neutro Abs 14.8 (*)    Lymphs Abs 0.3 (*)    Abs Immature Granulocytes 0.09 (*)    All other components within normal limits  COMPREHENSIVE METABOLIC PANEL - Abnormal; Notable for the following components:   Sodium 134 (*)    BUN  26 (*)    Calcium 8.8 (*)    All other components within normal limits  TROPONIN I (HIGH SENSITIVITY) - Abnormal; Notable for the following components:   Troponin I (High Sensitivity) 54 (*)    All other components within normal limits  TROPONIN I (HIGH SENSITIVITY) - Abnormal; Notable for the following components:   Troponin I (High Sensitivity) 57 (*)    All other components within normal limits  RESP PANEL BY RT-PCR (FLU A&B, COVID) ARPGX2  CULTURE, BLOOD (SINGLE)  URINALYSIS, ROUTINE W REFLEX MICROSCOPIC  LACTIC  ACID, PLASMA    EKG EKG Interpretation  Date/Time:  Saturday November 13 2021 14:28:12 EST Ventricular Rate:  83 PR Interval:  154 QRS Duration: 86 QT Interval:  374 QTC Calculation: 440 R Axis:   91 Text Interpretation: Sinus rhythm Right axis deviation Minimal ST depression, inferior leads Baseline wander in lead(s) V4 No previous ECGs available Confirmed by Fredia Sorrow [phone]) on 11/13/2021 2:32:35 PM  Radiology DG Chest 1 View  Result Date: 11/13/2021 CLINICAL DATA:  Weakness, fall EXAM: CHEST  1 VIEW COMPARISON:  06/25/2019 FINDINGS: Patient is rotated. Heart size within normal limits. Aorta is calcified and tortuous. Hyperinflated lungs with coarsened interstitial markings bilaterally, similar in appearance to prior. Streaky left basilar interstitial opacities. Minimally increased interstitial opacities within the right lung. No pleural effusion or pneumothorax. IMPRESSION: Streaky left basilar opacity and minimally increased interstitial opacities within the right lung, which may represent progressive interstitial lung disease or superimposed multifocal infection. Electronically Signed   By: Davina Poke D.O.   On: 11/13/2021 16:38   CT Head Wo Contrast  Result Date: 11/13/2021 CLINICAL DATA:  Dizziness, persistent/recurrent. Cardiac or vascular cause suspected. Fell today. EXAM: CT HEAD WITHOUT CONTRAST TECHNIQUE: Contiguous axial images were obtained  from the base of the skull through the vertex without intravenous contrast. RADIATION DOSE REDUCTION: This exam was performed according to the departmental dose-optimization program which includes automated exposure control, adjustment of the mA and/or kV according to patient size and/or use of iterative reconstruction technique. COMPARISON:  None. FINDINGS: Brain: Mild age related atrophy. Mild chronic small-vessel ischemic changes of the white matter. No evidence of acute infarction, mass lesion, hemorrhage, hydrocephalus or extra-axial collection. Vascular: No abnormal vascular finding. Skull: Normal Sinuses/Orbits: Clear/normal Other: Fluid in the middle ear on the left.  Left mastoid effusion. IMPRESSION: Normal appearance of the brain for age. Fluid in the middle ear on the left. Fluid throughout the mastoid air cells on the left. This could possibly relate to dizziness. Electronically Signed   By: Nelson Chimes M.D.   On: 11/13/2021 16:12   DG Knee Complete 4 Views Right  Result Date: 11/13/2021 CLINICAL DATA:  Weakness.  Fall EXAM: RIGHT KNEE - COMPLETE 4+ VIEW COMPARISON:  None. FINDINGS: Postsurgical changes of medial compartment arthroplasty. Arthroplasty components are in their expected alignment without perihardware lucency or fracture. Bones are demineralized. No evidence of fracture or malalignment. Trace joint effusion. Soft tissues within normal limits. IMPRESSION: 1. No acute osseous abnormality. 2. Medial compartment arthroplasty without evidence of hardware complication. Electronically Signed   By: Davina Poke D.O.   On: 11/13/2021 16:43   DG Hip Unilat W or Wo Pelvis 2-3 Views Right  Result Date: 11/13/2021 CLINICAL DATA:  Weakness EXAM: DG HIP (WITH OR WITHOUT PELVIS) 2-3V RIGHT COMPARISON:  04/10/2019 FINDINGS: Bones are demineralized. There is no evidence of hip fracture or dislocation. Lumbosacral fusion hardware. There is no evidence of arthropathy or other focal bone  abnormality. IMPRESSION: Negative. Electronically Signed   By: Davina Poke D.O.   On: 11/13/2021 16:44    Procedures Procedures    Medications Ordered in ED Medications  cefTRIAXone (ROCEPHIN) 2 g in sodium chloride 0.9 % 100 mL IVPB (has no administration in time range)  azithromycin (ZITHROMAX) 500 mg in sodium chloride 0.9 % 250 mL IVPB (500 mg Intravenous New Bag/Given 11/13/21 1807)  sodium chloride 0.9 % bolus 500 mL (0 mLs Intravenous Stopped 11/13/21 1726)  sodium chloride 0.9 % bolus 500 mL (500 mLs Intravenous New  Bag/Given 11/13/21 1806)    ED Course/ Medical Decision Making/ A&P                           Medical Decision Making Amount and/or Complexity of Data Reviewed Labs: ordered. Radiology: ordered. ECG/medicine tests: ordered.  Risk Decision regarding hospitalization.   Patient with community-acquired pneumonia.  He will be admitted for IV antibiotics    This patient presents to the ED for concern of cough and weakness and, this involves an extensive number of treatment options, and is a complaint that carries with it a high risk of complications and morbidity.  The differential diagnosis includes pneumonia MI   Co morbidities that complicate the patient evaluation  See   Additional history obtained:  Additional history obtained from patient and family External records from outside source obtained and reviewed including hospital records   Lab Tests:  I Ordered, and personally interpreted labs.  The pertinent results include: CBC shows elevated white count 15.7 and anemia 11.8   Imaging Studies ordered:  I ordered imaging studies including chest x-ray I independently visualized and interpreted imaging which showed possible multifocal infection I agree with the radiologist interpretation   Cardiac Monitoring:  The patient was maintained on a cardiac monitor.  I personally viewed and interpreted the cardiac monitored which showed an  underlying rhythm of: Normal sinus rhythm   Medicines ordered and prescription drug management:  I ordered medication including Zithromax and Rocephin Reevaluation of the patient after these medicines showed that the patient stayed the same I have reviewed the patients home medicines and have made adjustments as needed   Test Considered:  CT chest   Critical Interventions:  Antibiotics   Consultations Obtained:  I requested consultation with the hospitalist,  and discussed lab and imaging findings as well as pertinent plan - they recommend: Admission with IV antibiotics   Problem List / ED Course:  Pneumonia   Reevaluation:  After the interventions noted above, I reevaluated the patient and found that they have :stayed the same   Social Determinants of Health:  None   Dispostion:  After consideration of the diagnostic results and the patients response to treatment, I feel that the patent would benefit from admission and IV antibiotics.         Final Clinical Impression(s) / ED Diagnoses Final diagnoses:  Weak  Community acquired pneumonia of right lower lobe of lung    Rx / DC Orders ED Discharge Orders     None         Milton Ferguson, MD 11/15/21 1128

## 2021-11-13 NOTE — Progress Notes (Signed)
Advised by Pharmacy that they do not have Fluvoxamine stocked. Contacted wife and asked her to bring to hospital when she can along with cellphone and watch.

## 2021-11-14 DIAGNOSIS — G2581 Restless legs syndrome: Secondary | ICD-10-CM | POA: Diagnosis present

## 2021-11-14 DIAGNOSIS — E785 Hyperlipidemia, unspecified: Secondary | ICD-10-CM

## 2021-11-14 DIAGNOSIS — R531 Weakness: Secondary | ICD-10-CM | POA: Diagnosis not present

## 2021-11-14 DIAGNOSIS — J189 Pneumonia, unspecified organism: Secondary | ICD-10-CM | POA: Diagnosis not present

## 2021-11-14 DIAGNOSIS — I248 Other forms of acute ischemic heart disease: Secondary | ICD-10-CM | POA: Diagnosis present

## 2021-11-14 DIAGNOSIS — I2489 Other forms of acute ischemic heart disease: Secondary | ICD-10-CM | POA: Diagnosis present

## 2021-11-14 LAB — CBC
HCT: 35.1 % — ABNORMAL LOW (ref 39.0–52.0)
Hemoglobin: 11.3 g/dL — ABNORMAL LOW (ref 13.0–17.0)
MCH: 33.4 pg (ref 26.0–34.0)
MCHC: 32.2 g/dL (ref 30.0–36.0)
MCV: 103.8 fL — ABNORMAL HIGH (ref 80.0–100.0)
Platelets: 128 10*3/uL — ABNORMAL LOW (ref 150–400)
RBC: 3.38 MIL/uL — ABNORMAL LOW (ref 4.22–5.81)
RDW: 12.6 % (ref 11.5–15.5)
WBC: 9.2 10*3/uL (ref 4.0–10.5)
nRBC: 0 % (ref 0.0–0.2)

## 2021-11-14 LAB — BASIC METABOLIC PANEL
Anion gap: 9 (ref 5–15)
BUN: 22 mg/dL (ref 8–23)
CO2: 28 mmol/L (ref 22–32)
Calcium: 8.5 mg/dL — ABNORMAL LOW (ref 8.9–10.3)
Chloride: 101 mmol/L (ref 98–111)
Creatinine, Ser: 0.88 mg/dL (ref 0.61–1.24)
GFR, Estimated: >60 mL/min (ref >60)
Glucose, Bld: 97 mg/dL (ref 70–99)
Potassium: 3.6 mmol/L (ref 3.5–5.1)
Sodium: 138 mmol/L (ref 135–145)

## 2021-11-14 LAB — STREP PNEUMONIAE URINARY ANTIGEN: Strep Pneumo Urinary Antigen: NEGATIVE

## 2021-11-14 MED ORDER — SODIUM CHLORIDE 0.9 % IV SOLN
500.0000 mg | INTRAVENOUS | Status: DC
  Administered 2021-11-14: 500 mg via INTRAVENOUS
  Filled 2021-11-14: qty 5

## 2021-11-14 MED ORDER — LORATADINE 10 MG PO TABS
10.0000 mg | ORAL_TABLET | Freq: Every day | ORAL | Status: DC
  Administered 2021-11-14 – 2021-11-15 (×2): 10 mg via ORAL
  Filled 2021-11-14 (×2): qty 1

## 2021-11-14 MED ORDER — MELATONIN 3 MG PO TABS
6.0000 mg | ORAL_TABLET | Freq: Once | ORAL | Status: AC
  Administered 2021-11-14: 6 mg via ORAL
  Filled 2021-11-14: qty 2

## 2021-11-14 MED ORDER — FLUVOXAMINE MALEATE 50 MG PO TABS
50.0000 mg | ORAL_TABLET | Freq: Two times a day (BID) | ORAL | Status: DC
  Administered 2021-11-14 – 2021-11-15 (×3): 50 mg via ORAL
  Filled 2021-11-14 (×7): qty 1

## 2021-11-14 NOTE — Assessment & Plan Note (Addendum)
Continue empiric Rocephin, azithromycin --> Keflex, azithromycin on dc

## 2021-11-14 NOTE — Assessment & Plan Note (Signed)
Uses 2 L nasal cannula O2 at baseline, has CPAP intolerance

## 2021-11-14 NOTE — Assessment & Plan Note (Signed)
- 

## 2021-11-14 NOTE — Assessment & Plan Note (Signed)
Requip  ?

## 2021-11-14 NOTE — Progress Notes (Signed)
°  Progress Note   Patient: Samuel Olson ZOX:[id number] DOB: [birth year] DOA: 11/13/2021     1 DOS: the patient was seen and examined on 11/14/2021   Brief hospital course: MARTAVIOUS HARTEL is a 81 y.o. male with medical history significant of GERD, sleep apnea on 2 L nasal cannula at night, IBS, hyperlipidemia, possible early dementia.  Patient seen for fevers, chills, cough that started last night.  Patient had poor sleep due to the fevers and chills.  He awoke, he was noted to be weak and had difficulty walking and he fell at home.  No palliating or provoking factors.  He was brought to the hospital for evaluation.  Here, the patient had a temperature of 99.4.  Chest x-ray shows probable pneumonia.  Count elevated to 15.7 with normal lactic acid.  Patient started on antibiotics.  2/12: Patient on his baseline oxygen requirement.  States that he continues to have some dry cough.  No fevers or chills.  No shortness of breath.  Assessment and Plan: * CAP (community acquired pneumonia)- (present on admission) Continue empiric Rocephin, azithromycin  HLD (hyperlipidemia) Zocor  RLS (restless legs syndrome) Requip  Demand ischemia (HCC) Not ACS, troponin 54, 57  Sleep apnea- (present on admission) Uses 2 L nasal cannula O2 at baseline, has CPAP intolerance  Seizures (HCC) Phenobarbital          Physical Exam: Vitals:   11/13/21 2033 11/14/21 0441 11/14/21 0854 11/14/21 1250  BP: 122/60 (!) 117/53 (!) 107/57 106/62  Pulse: 77 (!) 51 (!) 50 (!) 54  Resp: 18  15 16   Temp: 100 F (37.8 C) 98.6 F (37 C) 97.6 F (36.4 C) 97.8 F (36.6 C)  TempSrc: Oral Oral    SpO2: 94% 100% 99% 99%  Weight:      Height:       Examination: General exam: Appears calm and comfortable, hard of hearing Respiratory system: Mild crackles throughout, no respiratory distress, on 2 L oxygen Cardiovascular system: S1 & S2 heard. No pedal edema. Gastrointestinal system: Abdomen is nondistended, soft and  nontender. Normal bowel sounds heard. Central nervous system: Alert and oriented. Non focal exam. Speech clear  Extremities: Symmetric in appearance bilaterally  Skin: No rashes, lesions or ulcers on exposed skin  Psychiatry: Judgement and insight appear stable. Mood & affect appropriate.    Data Reviewed:  BMP unremarkable, troponin 54, 57, lactic acid 0.8, WBC 9.2  Family Communication: No family at bedside   Disposition: Status is: Inpatient Remains inpatient appropriate because: remains on IV antibiotics           Planned Discharge Destination: Home     Author: Dessa Phi, DO 11/14/2021 12:58 PM  For on call review [email or web address].

## 2021-11-14 NOTE — Assessment & Plan Note (Signed)
Not ACS, troponin 54, 57

## 2021-11-14 NOTE — Hospital Course (Addendum)
Samuel Olson is a 81 y.o. male with medical history significant of GERD, sleep apnea on 2 L nasal cannula at night, IBS, hyperlipidemia, possible early dementia.  Patient seen for fevers, chills, cough that started last night.  Patient had poor sleep due to the fevers and chills.  He awoke, he was noted to be weak and had difficulty walking and he fell at home.  No palliating or provoking factors.  He was brought to the hospital for evaluation.  Here, the patient had a temperature of 99.4.  Chest x-ray shows probable pneumonia.  Count elevated to 15.7 with normal lactic acid.  Patient started on antibiotics.  Patient continued to improve clinically. WBC improved and 4.0 on day of discharge. He was using 2L O2, which he will continue at home (uses 2L qhs at home).

## 2021-11-14 NOTE — Assessment & Plan Note (Signed)
Phenobarbital

## 2021-11-15 DIAGNOSIS — J189 Pneumonia, unspecified organism: Secondary | ICD-10-CM | POA: Diagnosis not present

## 2021-11-15 DIAGNOSIS — R531 Weakness: Secondary | ICD-10-CM | POA: Diagnosis not present

## 2021-11-15 LAB — BASIC METABOLIC PANEL
Anion gap: 6 (ref 5–15)
BUN: 24 mg/dL — ABNORMAL HIGH (ref 8–23)
CO2: 30 mmol/L (ref 22–32)
Calcium: 8 mg/dL — ABNORMAL LOW (ref 8.9–10.3)
Chloride: 99 mmol/L (ref 98–111)
Creatinine, Ser: 0.57 mg/dL — ABNORMAL LOW (ref 0.61–1.24)
GFR, Estimated: >60 mL/min (ref >60)
Glucose, Bld: 85 mg/dL (ref 70–99)
Potassium: 3.5 mmol/L (ref 3.5–5.1)
Sodium: 135 mmol/L (ref 135–145)

## 2021-11-15 LAB — CBC
HCT: 35.9 % — ABNORMAL LOW (ref 39.0–52.0)
Hemoglobin: 12 g/dL — ABNORMAL LOW (ref 13.0–17.0)
MCH: 34.6 pg — ABNORMAL HIGH (ref 26.0–34.0)
MCHC: 33.4 g/dL (ref 30.0–36.0)
MCV: 103.5 fL — ABNORMAL HIGH (ref 80.0–100.0)
Platelets: 134 10*3/uL — ABNORMAL LOW (ref 150–400)
RBC: 3.47 MIL/uL — ABNORMAL LOW (ref 4.22–5.81)
RDW: 12.7 % (ref 11.5–15.5)
WBC: 4 10*3/uL (ref 4.0–10.5)
nRBC: 0 % (ref 0.0–0.2)

## 2021-11-15 LAB — LEGIONELLA PNEUMOPHILA SEROGP 1 UR AG: L. pneumophila Serogp 1 Ur Ag: NEGATIVE

## 2021-11-15 MED ORDER — AZITHROMYCIN 500 MG PO TABS: 500.0000 mg | ORAL_TABLET | Freq: Every day | ORAL | 0 refills | Status: AC

## 2021-11-15 MED ORDER — CEPHALEXIN 500 MG PO CAPS: 500.0000 mg | ORAL_CAPSULE | Freq: Four times a day (QID) | ORAL | 0 refills | Status: AC

## 2021-11-15 NOTE — Evaluation (Signed)
Physical Therapy Evaluation Patient Details Name: KINNEY SACKMANN MRN: [id number] DOB: 1941/03/03 Today's Date: 11/15/2021  History of Present Illness  Samuel Olson is a 81 y.o. male with medical history significant of GERD, sleep apnea on 2 L nasal cannula at night, IBS, hyperlipidemia, possible early dementia.  Patient seen for fevers, chills, cough that started last night.  Patient had poor sleep due to the fevers and chills.  He awoke, he was noted to be weak and had difficulty walking and he fell at home.  No palliating or provoking factors.  He was brought to the hospital for evaluation.  Here, the patient had a temperature of 99.4.  Chest x-ray shows probable pneumonia.  Count elevated to 15.7 with normal lactic acid.  Patient started on antibiotics.   Clinical Impression  Patient functioning near baseline for functional mobility other having mild posterior leaning during initial standing requiring Min guard/Min assist to avoid falling backwards, but improved after taking steps in room, able to ambulate in hallway with slow labored cadence without loss of balance and tolerated staying up in chair after therapy.  PLAN:  Patient to be discharged home today and discharged from acute physical therapy to care of nursing for ambulation as tolerated for length of stay with recommendations stated below         Recommendations for follow up therapy are one component of a multi-disciplinary discharge planning process, led by the attending physician.  Recommendations may be updated based on patient status, additional functional criteria and insurance authorization.  Follow Up Recommendations Home health PT    Assistance Recommended at Discharge Set up Supervision/Assistance  Patient can return home with the following  A little help with walking and/or transfers;A little help with bathing/dressing/bathroom;Help with stairs or ramp for entrance;Assistance with cooking/housework    Equipment Recommendations  None recommended by PT  Recommendations for Other Services       Functional Status Assessment Patient has had a recent decline in their functional status and demonstrates the ability to make significant improvements in function in a reasonable and predictable amount of time.     Precautions / Restrictions Precautions Precautions: Fall Restrictions Weight Bearing Restrictions: No      Mobility  Bed Mobility Overal bed mobility: Modified Independent             General bed mobility comments: increased time    Transfers Overall transfer level: Needs assistance Equipment used: Rolling walker (2 wheels) Transfers: Sit to/from Stand, Bed to chair/wheelchair/BSC Sit to Stand: Min guard, Min assist   Step pivot transfers: Min assist       General transfer comment: increased time, labored movement, posterior leaning when standing    Ambulation/Gait Ambulation/Gait assistance: Min assist, Min guard Gait Distance (Feet): 65 Feet Assistive device: Rolling walker (2 wheels) Gait Pattern/deviations: Decreased step length - right, Decreased step length - left, Decreased stride length, Trunk flexed Gait velocity: decreased     General Gait Details: slow labored cadence with flexed trunk due to weak back extensors, slightly unsteady without loss of balance, limited mostly due to fatigue  Stairs            Wheelchair Mobility    Modified Rankin (Stroke Patients Only)       Balance Overall balance assessment: Needs assistance Sitting-balance support: Feet supported, No upper extremity supported Sitting balance-Leahy Scale: Fair Sitting balance - Comments: fair/good seated at EOB   Standing balance support: During functional activity, Bilateral upper extremity supported Standing balance-Leahy Scale:  Fair Standing balance comment: using RW                             Pertinent Vitals/Pain Pain Assessment Pain Assessment: No/denies pain    Home  Living Family/patient expects to be discharged to:: Private residence Living Arrangements: Spouse/significant other;Non-relatives/Friends;Children Available Help at Discharge: Family Type of Home: Mobile home Home Access: Stairs to enter Entrance Stairs-Rails: Right;Left;Can reach both Entrance Stairs-Number of Steps: 2   Home Layout: One level Home Equipment: Conservation officer, nature (2 wheels);Cane - quad      Prior Function Prior Level of Function : Needs assist       Physical Assist : Mobility (physical);ADLs (physical) Mobility (physical): Bed mobility;Transfers;Gait;Stairs   Mobility Comments: household ambulator using RW ADLs Comments: assisted by family     Hand Dominance   Dominant Hand: Right    Extremity/Trunk Assessment   Upper Extremity Assessment Upper Extremity Assessment: Generalized weakness    Lower Extremity Assessment Lower Extremity Assessment: Generalized weakness    Cervical / Trunk Assessment Cervical / Trunk Assessment: Kyphotic  Communication   Communication: HOH  Cognition Arousal/Alertness: Awake/alert Behavior During Therapy: WFL for tasks assessed/performed Overall Cognitive Status: Within Functional Limits for tasks assessed                                          General Comments      Exercises     Assessment/Plan    PT Assessment All further PT needs can be met in the next venue of care  PT Problem List Decreased strength;Decreased activity tolerance;Decreased balance;Decreased mobility       PT Treatment Interventions      PT Goals (Current goals can be found in the Care Plan section)  Acute Rehab PT Goals Patient Stated Goal: return home with family to assist PT Goal Formulation: With patient Time For Goal Achievement: 11/15/21 Potential to Achieve Goals: Good    Frequency       Co-evaluation               AM-PAC PT "6 Clicks" Mobility  Outcome Measure Help needed turning from your back to  your side while in a flat bed without using bedrails?: None Help needed moving from lying on your back to sitting on the side of a flat bed without using bedrails?: None Help needed moving to and from a bed to a chair (including a wheelchair)?: A Little Help needed standing up from a chair using your arms (e.g., wheelchair or bedside chair)?: A Little Help needed to walk in hospital room?: A Little Help needed climbing 3-5 steps with a railing? : A Lot 6 Click Score: 19    End of Session   Activity Tolerance: Patient tolerated treatment well;Patient limited by fatigue Patient left: in chair;with call bell/phone within reach Nurse Communication: Mobility status PT Visit Diagnosis: Unsteadiness on feet (R26.81);Other abnormalities of gait and mobility (R26.89);Muscle weakness (generalized) (M62.81)    Time: 8916-9450 PT Time Calculation (min) (ACUTE ONLY): 20 min   Charges:   PT Evaluation $PT Eval Moderate Complexity: 1 Mod PT Treatments $Therapeutic Activity: 8-22 mins        2:40 PM, 11/15/21 [name], MPT Physical Therapist with [facility] [phone] office [phone] mobile phone

## 2021-11-15 NOTE — Plan of Care (Signed)

## 2021-11-15 NOTE — Care Management Important Message (Signed)
Important Message  Patient Details  Name: Samuel Olson MRN: [id number] Date of Birth: 15-Nov-1940   Medicare Important Message Given:  Yes     Tommy Medal 11/15/2021, 11:21 AM

## 2021-11-15 NOTE — Discharge Summary (Signed)
Physician Discharge Summary   Patient: Samuel Olson MRN: [id number] DOB: 04/29/1941  Admit date:     11/13/2021  Discharge date: 11/15/21  Discharge Physician: Dessa Phi   PCP: Elyn Aquas   Recommendations at discharge:    Follow up with PCP   Discharge Diagnoses: Principal Problem:   CAP (community acquired pneumonia) Active Problems:   Seizures ([place])   Sleep apnea   Demand ischemia (HCC)   RLS (restless legs syndrome)   HLD (hyperlipidemia)  Resolved Problems:   * No resolved hospital problems. *   Hospital Course: Samuel Olson is a 81 y.o. male with medical history significant of GERD, sleep apnea on 2 L nasal cannula at night, IBS, hyperlipidemia, possible early dementia.  Patient seen for fevers, chills, cough that started last night.  Patient had poor sleep due to the fevers and chills.  He awoke, he was noted to be weak and had difficulty walking and he fell at home.  No palliating or provoking factors.  He was brought to the hospital for evaluation.  Here, the patient had a temperature of 99.4.  Chest x-ray shows probable pneumonia.  Count elevated to 15.7 with normal lactic acid.  Patient started on antibiotics.  Patient continued to improve clinically. WBC improved and 4.0 on day of discharge. He was using 2L O2, which he will continue at home (uses 2L qhs at home).   Assessment and Plan: * CAP (community acquired pneumonia)- (present on admission) Continue empiric Rocephin, azithromycin --> Keflex, azithromycin on dc   HLD (hyperlipidemia) Zocor  RLS (restless legs syndrome)- (present on admission) Requip  Demand ischemia ([place])- (present on admission) Not ACS, troponin 54, 57  Sleep apnea- (present on admission) Uses 2 L nasal cannula O2 at baseline, has CPAP intolerance  Seizures ([place]) Phenobarbital            Consultants: None Procedures performed: None   Disposition: Home health Diet recommendation:  Regular diet  DISCHARGE  MEDICATION: Allergies as of 11/15/2021       Reactions   Phenergan [promethazine] Other (See Comments)   Cramps, shakes.   Augmentin [amoxicillin-pot Clavulanate] Other (See Comments)   UNKNOWN [SEE BELOW] Has patient had a PCN reaction causing immediate rash, facial/tongue/throat swelling, SOB or lightheadedness with hypotension: Unknown Has patient had a PCN reaction causing severe rash involving mucus membranes or skin necrosis: Uknown PATIENT HAS HAD A PCN REACTION THAT REQUIRED HOSPITALIZATION: >> UNSPECIFIED REACTION WHILE HE WAS ALREADY ADMITTED TO A HOSPITAL.  Has patient had a PCN reaction occurring within the last 10 years: No   Oxycodone Other (See Comments)   UNSPECIFIED REACTION  TOLERATES APAP WITHOUT OXYCODONE   Percocet [oxycodone-acetaminophen] Other (See Comments)   Unknown. Tolerates acetaminophen alone.        Medication List     STOP taking these medications    mupirocin ointment 2 % Commonly known as: BACTROBAN       TAKE these medications    aspirin EC 81 MG tablet Take 81 mg by mouth daily. Swallow whole.   azithromycin 500 MG tablet Commonly known as: Zithromax Take 1 tablet (500 mg total) by mouth daily for 3 days. Take 1 tablet daily for 3 days.   CALCIUM CITRATE PO Take 1-2 tablets by mouth daily.   celecoxib 200 MG capsule Commonly known as: CELEBREX Take 200 mg by mouth every evening.   cephALEXin 500 MG capsule Commonly known as: KEFLEX Take 1 capsule (500 mg total) by mouth 4 (four)  times daily for 3 days.   FERROUS SULFATE PO Take 270 mg by mouth daily.   FISH OIL-VITAMIN D PO Take 1,400 mg by mouth 2 (two) times a day.   Fluvoxamine Maleate 100 MG Cp24 Take 1 capsule by mouth once.   GLUCOSAMINE 1500 COMPLEX PO Take 1 capsule by mouth 2 (two) times daily.   HYDROcodone-acetaminophen 5-325 MG tablet Commonly known as: NORCO/VICODIN Take 1 tablet by mouth every 4 (four) hours as needed. What changed: how much to take    multivitamin with minerals Tabs tablet Take 1 tablet by mouth daily.   omeprazole 20 MG capsule Commonly known as: PRILOSEC TAKE 1 CAPSULE (20 MG TOTAL) BY MOUTH 2 (TWO) TIMES DAILY BEFORE A MEAL.   OXYGEN Inhale 2 L into the lungs at bedtime.   PHENobarbital 64.8 MG tablet Commonly known as: LUMINAL Take 64.8 mg by mouth daily after breakfast.   polycarbophil 625 MG tablet Commonly known as: FIBERCON Take 1,400 mg by mouth 2 (two) times daily.   polyethylene glycol 17 g packet Commonly known as: MIRALAX / GLYCOLAX Take 17 g by mouth daily.   PRESERVISION AREDS PO Take 1 tablet by mouth 2 (two) times daily.   rOPINIRole 0.25 MG tablet Commonly known as: REQUIP Take 3 mg by mouth 3 (three) times daily.   simvastatin 40 MG tablet Commonly known as: ZOCOR Take 40 mg by mouth at bedtime. for cholesterol   SUPER B COMPLEX/C PO Take 1 tablet by mouth every morning.   VITAMIN D3 PO Take 1,500 Units by mouth every morning.        Follow-up Information     Elyn Aquas. Schedule an appointment as soon as possible for a visit in 1 week(s).   Specialty: Family Medicine Contact information: [address] [place] [address] [phone]                 Discharge Exam: Filed Weights   11/13/21 1427  Weight: 74.8 kg   Examination: General exam: Appears calm and comfortable, hard of hearing Respiratory system: Clear to auscultation. Respiratory effort normal.  On nasal cannula O2 without distress Cardiovascular system: S1 & S2 heard. No pedal edema. Gastrointestinal system: Abdomen is nondistended, soft and nontender. Normal bowel sounds heard. Extremities: Symmetric in appearance bilaterally  Skin: No rashes, lesions or ulcers on exposed skin   Condition at discharge: stable  The results of significant diagnostics from this hospitalization (including imaging, microbiology, ancillary and laboratory) are listed below for reference.   Imaging  Studies: DG Chest 1 View  Result Date: 11/13/2021 CLINICAL DATA:  Weakness, fall EXAM: CHEST  1 VIEW COMPARISON:  06/25/2019 FINDINGS: Patient is rotated. Heart size within normal limits. Aorta is calcified and tortuous. Hyperinflated lungs with coarsened interstitial markings bilaterally, similar in appearance to prior. Streaky left basilar interstitial opacities. Minimally increased interstitial opacities within the right lung. No pleural effusion or pneumothorax. IMPRESSION: Streaky left basilar opacity and minimally increased interstitial opacities within the right lung, which may represent progressive interstitial lung disease or superimposed multifocal infection. Electronically Signed   By: Davina Poke D.O.   On: 11/13/2021 16:38   CT Head Wo Contrast  Result Date: 11/13/2021 CLINICAL DATA:  Dizziness, persistent/recurrent. Cardiac or vascular cause suspected. Fell today. EXAM: CT HEAD WITHOUT CONTRAST TECHNIQUE: Contiguous axial images were obtained from the base of the skull through the vertex without intravenous contrast. RADIATION DOSE REDUCTION: This exam was performed according to the departmental dose-optimization program which includes  automated exposure control, adjustment of the mA and/or kV according to patient size and/or use of iterative reconstruction technique. COMPARISON:  None. FINDINGS: Brain: Mild age related atrophy. Mild chronic small-vessel ischemic changes of the white matter. No evidence of acute infarction, mass lesion, hemorrhage, hydrocephalus or extra-axial collection. Vascular: No abnormal vascular finding. Skull: Normal Sinuses/Orbits: Clear/normal Other: Fluid in the middle ear on the left.  Left mastoid effusion. IMPRESSION: Normal appearance of the brain for age. Fluid in the middle ear on the left. Fluid throughout the mastoid air cells on the left. This could possibly relate to dizziness. Electronically Signed   By: Nelson Chimes M.D.   On: 11/13/2021 16:12   DG  Knee Complete 4 Views Right  Result Date: 11/13/2021 CLINICAL DATA:  Weakness.  Fall EXAM: RIGHT KNEE - COMPLETE 4+ VIEW COMPARISON:  None. FINDINGS: Postsurgical changes of medial compartment arthroplasty. Arthroplasty components are in their expected alignment without perihardware lucency or fracture. Bones are demineralized. No evidence of fracture or malalignment. Trace joint effusion. Soft tissues within normal limits. IMPRESSION: 1. No acute osseous abnormality. 2. Medial compartment arthroplasty without evidence of hardware complication. Electronically Signed   By: Davina Poke D.O.   On: 11/13/2021 16:43   DG Hip Unilat W or Wo Pelvis 2-3 Views Right  Result Date: 11/13/2021 CLINICAL DATA:  Weakness EXAM: DG HIP (WITH OR WITHOUT PELVIS) 2-3V RIGHT COMPARISON:  04/10/2019 FINDINGS: Bones are demineralized. There is no evidence of hip fracture or dislocation. Lumbosacral fusion hardware. There is no evidence of arthropathy or other focal bone abnormality. IMPRESSION: Negative. Electronically Signed   By: Davina Poke D.O.   On: 11/13/2021 16:44    Microbiology: Results for orders placed or performed during the hospital encounter of 11/13/21  Resp Panel by RT-PCR (Flu A&B, Covid) Nasopharyngeal Swab     Status: None   Collection Time: 11/13/21  3:27 PM   Specimen: Nasopharyngeal Swab; Nasopharyngeal(NP) swabs in vial transport medium  Result Value Ref Range Status   SARS Coronavirus 2 by RT PCR NEGATIVE NEGATIVE Final    Comment: (NOTE) SARS-CoV-2 target nucleic acids are NOT DETECTED.  The SARS-CoV-2 RNA is generally detectable in upper respiratory specimens during the acute phase of infection. The lowest concentration of SARS-CoV-2 viral copies this assay can detect is 138 copies/mL. A negative result does not preclude SARS-Cov-2 infection and should not be used as the sole basis for treatment or other patient management decisions. A negative result may occur with  improper  specimen collection/handling, submission of specimen other than nasopharyngeal swab, presence of viral mutation(s) within the areas targeted by this assay, and inadequate number of viral copies(<138 copies/mL). A negative result must be combined with clinical observations, patient history, and epidemiological information. The expected result is Negative.  Fact Sheet for Patients:  EntrepreneurPulse.com.au  Fact Sheet for Healthcare Providers:  IncredibleEmployment.be  This test is no t yet approved or cleared by the [place] FDA and  has been authorized for detection and/or diagnosis of SARS-CoV-2 by FDA under an Emergency Use Authorization (EUA). This EUA will remain  in effect (meaning this test can be used) for the duration of the COVID-19 declaration under Section 564(b)(1) of the Act, 21 U.S.C.section 360bbb-3(b)(1), unless the authorization is terminated  or revoked sooner.       Influenza A by PCR NEGATIVE NEGATIVE Final   Influenza B by PCR NEGATIVE NEGATIVE Final    Comment: (NOTE) The Xpert Xpress SARS-CoV-2/FLU/RSV plus assay is intended as an aid  in the diagnosis of influenza from Nasopharyngeal swab specimens and should not be used as a sole basis for treatment. Nasal washings and aspirates are unacceptable for Xpert Xpress SARS-CoV-2/FLU/RSV testing.  Fact Sheet for Patients: EntrepreneurPulse.com.au  Fact Sheet for Healthcare Providers: IncredibleEmployment.be  This test is not yet approved or cleared by the [place] FDA and has been authorized for detection and/or diagnosis of SARS-CoV-2 by FDA under an Emergency Use Authorization (EUA). This EUA will remain in effect (meaning this test can be used) for the duration of the COVID-19 declaration under Section 564(b)(1) of the Act, 21 U.S.C. section 360bbb-3(b)(1), unless the authorization is terminated or revoked.  Performed at  [facility], [address], [place], [place] [address]   Culture, blood (single)     Status: None (Preliminary result)   Collection Time: 11/13/21  5:49 PM   Specimen: BLOOD LEFT ARM  Result Value Ref Range Status   Specimen Description BLOOD LEFT ARM  Final   Special Requests   Final    BOTTLES DRAWN AEROBIC AND ANAEROBIC Blood Culture adequate volume   Culture   Final    NO GROWTH 2 DAYS Performed at [facility], [address], [place], [place] [address]    Report Status PENDING  Incomplete    Labs: CBC: Recent Labs  Lab 11/13/21 1516 11/14/21 0618 11/15/21 0505  WBC 15.7* 9.2 4.0  NEUTROABS 14.8*  --   --   HGB 11.8* 11.3* 12.0*  HCT 36.3* 35.1* 35.9*  MCV 101.7* 103.8* 103.5*  PLT 148* 128* 030*   Basic Metabolic Panel: Recent Labs  Lab 11/13/21 1516 11/14/21 0618 11/15/21 0505  NA 134* 138 135  K 4.0 3.6 3.5  CL 98 101 99  CO2 30 28 30   GLUCOSE 99 97 85  BUN 26* 22 24*  CREATININE 0.66 0.88 0.57*  CALCIUM 8.8* 8.5* 8.0*   Liver Function Tests: Recent Labs  Lab 11/13/21 1516  AST 32  ALT 27  ALKPHOS 99  BILITOT 0.6  PROT 6.8  ALBUMIN 3.7   CBG: No results for input(s): GLUCAP in the last 168 hours.  Discharge time spent: greater than 30 minutes.  Signed: Dessa Phi, DO [facility] 11/15/2021

## 2021-11-15 NOTE — TOC Transition Note (Signed)
Transition of Care [facility]) - CM/SW Discharge Note   Patient Details  Name: Samuel Olson MRN: [id number] Date of Birth: 1941-02-04  [facility] CM/SW Contact:  Shade Flood, LCSW Phone Number: 11/15/2021, 1:36 PM   Clinical Narrative:     Pt stable for dc today per MD. PT recommending HH PT at dc. Spoke with pt's wife to review dc planning. Pt/wife agreeable to [facility]. CMS provider options reviewed. Referred to [facility]. Cory at [facility] accepts and they will follow up with pt after dc. There are no other TOC needs for dc.   Expected Discharge Plan: [place] Barriers to Discharge: Barriers Resolved   Patient Goals and CMS Choice Patient states their goals for this hospitalization and ongoing recovery are:: go home CMS Medicare.gov Compare Post Acute Care list provided to:: Patient Represenative (must comment) Choice offered to / list presented to : Spouse  Expected Discharge Plan and Services Expected Discharge Plan: [place] In-house Referral: Clinical Social Work   Post Acute Care Choice: [place] arrangements for the past 2 months: [place] Expected Discharge Date: 11/15/21                         HH Arranged: PT, Nurse's Aide HH Agency: [place] Date [facility] Agency Contacted: 11/15/21   Representative spoke with at [place]: Tommi Rumps  Prior Living Arrangements/Services Living arrangements for the past 2 months: [place] Lives with:: Spouse Patient language and need for interpreter reviewed:: Yes Do you feel safe going back to the place where you live?: Yes      Need for Family Participation in Patient Care: No (Comment) Care giver support system in place?: Yes (comment) Current home services: DME Criminal Activity/Legal Involvement Pertinent to Current Situation/Hospitalization: No - Comment as needed  Activities of Daily Living Home Assistive Devices/Equipment: Gilford Rile (specify  type) ADL Screening (condition at time of admission) Patient's cognitive ability adequate to safely complete daily activities?: Yes Is the patient deaf or have difficulty hearing?: Yes Does the patient have difficulty seeing, even when wearing glasses/contacts?: No Does the patient have difficulty concentrating, remembering, or making decisions?: No Patient able to express need for assistance with ADLs?: Yes Does the patient have difficulty dressing or bathing?: No Independently performs ADLs?: Yes (appropriate for developmental age) Does the patient have difficulty walking or climbing stairs?: Yes Weakness of Legs: Both Weakness of Arms/Hands: None  Permission Sought/Granted Permission sought to share information with : Facility Art therapist granted to share information with : Yes, Verbal Permission Granted              Emotional Assessment       Orientation: : Oriented to Self, Oriented to Place, Oriented to Situation, Oriented to  Time Alcohol / Substance Use: Not Applicable Psych Involvement: No (comment)  Admission diagnosis:  Weak [R53.1] CAP (community acquired pneumonia) [J18.9] Community acquired pneumonia of right lower lobe of lung [J18.9] Patient Active Problem List   Diagnosis Date Noted   Demand ischemia ([place]) 11/14/2021   RLS (restless legs syndrome) 11/14/2021   HLD (hyperlipidemia) 11/14/2021   CAP (community acquired pneumonia) 11/13/2021   Sleep apnea 11/13/2021   Chronic constipation 07/17/2021   Dyspepsia 07/17/2021   Gastroesophageal reflux disease 03/22/2020   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   Generalized abdominal pain 06/28/2019   Abdominal cramping  06/28/2019   History of colonic polyps 06/28/2019   Biliary obstruction 11/30/2018   Choledocholithiasis 11/30/2018   Deep vein thrombosis (DVT) of right lower extremity ([place]) 11/30/2018   Seizures  ([place]) 11/29/2018   Anxiety 11/29/2018   Elevated LFTs 11/29/2018   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   Final next level of care: [place] Barriers to Discharge: Barriers Resolved   Patient Goals and CMS Choice Patient states their goals for this hospitalization and ongoing recovery are:: go home CMS Medicare.gov Compare Post Acute Care list provided to:: Patient Represenative (must comment) Choice offered to / list presented to : Spouse  Discharge Placement                       Discharge Plan and Services In-house Referral: Clinical Social Work   Post Acute Care Choice: Home Health                    HH Arranged: PT, Nurse's Aide [facility] Agency: [place] Date [facility] Agency Contacted: 11/15/21   Representative spoke with at [place]: [place] ([place]) Interventions     Readmission Risk Interventions No flowsheet data found.

## 2021-11-15 NOTE — Plan of Care (Signed)
°  Problem: Respiratory: Goal: Ability to maintain adequate ventilation will improve Outcome: Progressing Goal: Ability to maintain a clear airway will improve Outcome: Progressing   Problem: Education: Goal: Knowledge of General Education information will improve Description: Including pain rating scale, medication(s)/side effects and non-pharmacologic comfort measures Outcome: Progressing   Problem: Clinical Measurements: Goal: Ability to maintain clinical measurements within normal limits will improve Outcome: Progressing Goal: Will remain free from infection Outcome: Progressing Goal: Respiratory complications will improve Outcome: Progressing Goal: Cardiovascular complication will be avoided Outcome: Progressing   Problem: Activity: Goal: Risk for activity intolerance will decrease Outcome: Progressing   Problem: Nutrition: Goal: Adequate nutrition will be maintained Outcome: Progressing   Problem: Pain Managment: Goal: General experience of comfort will improve Outcome: Progressing   Problem: Safety: Goal: Ability to remain free from injury will improve Outcome: Progressing   Problem: Skin Integrity: Goal: Risk for impaired skin integrity will decrease Outcome: Progressing

## 2021-11-18 LAB — CULTURE, BLOOD (SINGLE)
Culture: NO GROWTH
Special Requests: ADEQUATE

## 2021-11-24 ENCOUNTER — Other Ambulatory Visit: Payer: Self-pay | Admitting: Podiatry

## 2021-11-24 ENCOUNTER — Other Ambulatory Visit (HOSPITAL_COMMUNITY): Payer: Self-pay | Admitting: Podiatry

## 2021-11-24 DIAGNOSIS — I739 Peripheral vascular disease, unspecified: Secondary | ICD-10-CM

## 2021-11-25 ENCOUNTER — Other Ambulatory Visit: Payer: Self-pay

## 2021-11-25 ENCOUNTER — Ambulatory Visit (HOSPITAL_COMMUNITY)
Admission: RE | Admit: 2021-11-25 | Discharge: 2021-11-25 | Disposition: A | Discharge status: Home / Self Care | Payer: Medicare Other | Source: Ambulatory Visit | Attending: Podiatry | Admitting: Podiatry

## 2021-11-25 ENCOUNTER — Other Ambulatory Visit (HOSPITAL_COMMUNITY): Payer: Self-pay | Admitting: Podiatry

## 2021-11-25 DIAGNOSIS — I739 Peripheral vascular disease, unspecified: Secondary | ICD-10-CM | POA: Insufficient documentation

## 2021-12-08 ENCOUNTER — Encounter: Payer: Self-pay | Admitting: Vascular Surgery

## 2021-12-08 ENCOUNTER — Other Ambulatory Visit: Payer: Self-pay

## 2021-12-08 ENCOUNTER — Ambulatory Visit: Payer: Medicare Other | Admitting: Vascular Surgery

## 2021-12-08 VITALS — BP 127/75 | HR 62 | Ht 67.0 in | Wt 163.6 lb

## 2021-12-08 DIAGNOSIS — I739 Peripheral vascular disease, unspecified: Secondary | ICD-10-CM | POA: Diagnosis not present

## 2021-12-08 NOTE — Progress Notes (Signed)
[facility] [place]  Patient name: Samuel Olson MRN: [id number] DOB: [birth year] Sex: male  REASON FOR CONSULT: Evaluation of lower extremity arterial disease  HPI: [name] is a 81 y.o. male, who is seen today for evaluation of potential lower extremity arterial disease and abnormal noninvasive studies.  He is here today with his granddaughter and his wife is with [place] by telephone.  He had difficulty with a callus on his left foot and this was removed by Dr. Caprice Beaver.  He has had some ongoing discomfort and underwent noninvasive studies at [facility] on 11/25/2021.  This revealed elevated ankle arm indices and probable medial calcification bilaterally and he is seen today for further discussion.  He does not have any history of lower extremity tissue loss.  He does have swelling in both lower extremities and is wearing his compression garments appropriately.  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   Sleep apnea    Uses O2 at 2L while sleeping; "can't tolerate CPAP OR BIPAP" * (04/17/2017)   Vertigo    Wears glasses     Family History  Problem Relation Age of Onset   Heart disease Mother    Heart disease Father    Breast cancer Sister    Heart disease Brother    Colon cancer Neg Hx    Esophageal cancer Neg Hx    Inflammatory bowel disease Neg Hx    Liver disease Neg Hx    Pancreatic cancer  Neg Hx    Rectal cancer Neg Hx    Stomach cancer Neg Hx     SOCIAL HISTORY: Social History   Socioeconomic History   Marital status: Married    Spouse name: Not on file   Number of children: 3   Years of education: Not on file   Highest education level: Not on file  Occupational History   Occupation: retired  Tobacco Use   Smoking status: Former    Types: Cigarettes    Quit date: 1966    Years since quitting: 57.2   Smokeless tobacco: Never   Tobacco comments:    quit smoking cigarettes in 1966  Vaping Use   Vaping Use: Never used  Substance and Sexual Activity   Alcohol use: No   Drug use: No   Sexual activity: Not on file  Other Topics Concern   Not on file  Social History Narrative   Not on file   Social Determinants of Health   Financial Resource Strain: Not on file  Food Insecurity: Not on file  Transportation Needs: Not on file  Physical Activity: Not on file  Stress: Not on file  Social Connections: Not on file  Intimate Partner Violence: Not on file    Allergies  Allergen Reactions   Phenergan [  Promethazine] Other (See Comments)    Cramps, shakes.   Augmentin [Amoxicillin-Pot Clavulanate] Other (See Comments)    UNKNOWN [SEE BELOW]  Has patient had a PCN reaction causing immediate rash, facial/tongue/throat swelling, SOB or lightheadedness with hypotension: Unknown Has patient had a PCN reaction causing severe rash involving mucus membranes or skin necrosis: Uknown PATIENT HAS HAD A PCN REACTION THAT REQUIRED HOSPITALIZATION: >> UNSPECIFIED REACTION WHILE HE WAS ALREADY ADMITTED TO A HOSPITAL.  Has patient had a PCN reaction occurring within the last 10 years: No    Oxycodone Other (See Comments)    UNSPECIFIED REACTION  TOLERATES APAP WITHOUT OXYCODONE   Percocet [Oxycodone-Acetaminophen] Other (See Comments)    Unknown. Tolerates acetaminophen alone.    Current Outpatient Medications  Medication Sig Dispense Refill   aspirin EC 81 MG tablet  Take 81 mg by mouth daily. Swallow whole.     CALCIUM CITRATE PO Take 1-2 tablets by mouth daily.     celecoxib (CELEBREX) 200 MG capsule Take 200 mg by mouth every evening.  12   Cholecalciferol (VITAMIN D3 PO) Take 1,500 Units by mouth every morning.      clarithromycin (BIAXIN) 250 MG tablet Take 250 mg by mouth 2 (two) times daily. Prior to dental work     FERROUS SULFATE PO Take 270 mg by mouth daily.     FISH OIL-VITAMIN D PO Take 1,400 mg by mouth 2 (two) times a day.     fluticasone (FLONASE) 50 MCG/ACT nasal spray Place into both nostrils daily. prn     Fluvoxamine Maleate 100 MG CP24 Take 1 capsule by mouth once.     Glucosamine-Chondroit-Vit C-Mn (GLUCOSAMINE 1500 COMPLEX PO) Take 1 capsule by mouth 2 (two) times daily.     HYDROcodone-acetaminophen (NORCO/VICODIN) 5-325 MG tablet Take 1 tablet by mouth every 4 (four) hours as needed. (Patient taking differently: Take 1 tablet by mouth 2 (two) times daily.) 15 tablet 0   Multiple Vitamin (MULTIVITAMIN WITH MINERALS) TABS tablet Take 1 tablet by mouth daily.     Multiple Vitamins-Minerals (PRESERVISION AREDS PO) Take 1 tablet by mouth 2 (two) times daily.     omeprazole (PRILOSEC) 20 MG capsule TAKE 1 CAPSULE (20 MG TOTAL) BY MOUTH 2 (TWO) TIMES DAILY BEFORE A MEAL. 180 capsule 1   OXYGEN Inhale 2 L into the lungs at bedtime.     PHENobarbital (LUMINAL) 64.8 MG tablet Take 60 mg by mouth daily after breakfast.  1   polycarbophil (FIBERCON) 625 MG tablet Take 1,400 mg by mouth 2 (two) times daily.      polyethylene glycol (MIRALAX / GLYCOLAX) 17 g packet Take 17 g by mouth daily. 1/2 capful daily     rOPINIRole (REQUIP) 0.25 MG tablet Take 3 mg by mouth 3 (three) times daily.     simvastatin (ZOCOR) 40 MG tablet Take 40 mg by mouth at bedtime. for cholesterol  3   SUPER B COMPLEX/C PO Take 1 tablet by mouth every morning.      No current facility-administered medications for this visit.    REVIEW OF SYSTEMS:  '[X]'$  denotes positive  finding, '[ ]'$  denotes negative finding Cardiac  Comments:  Chest pain or chest pressure:    Shortness of breath upon exertion:    Short of breath when lying flat:    Irregular heart rhythm:        Vascular    Pain in calf, thigh, or hip brought on by ambulation:    Pain in feet at  night that wakes you up from your sleep:     Blood clot in your veins:    Leg swelling:  x       Pulmonary    Oxygen at home: x   Productive cough:     Wheezing:         Neurologic    Sudden weakness in arms or legs:     Sudden numbness in arms or legs:     Sudden onset of difficulty speaking or slurred speech:    Temporary loss of vision in one eye:     Problems with dizziness:         Gastrointestinal    Blood in stool:     Vomited blood:         Genitourinary    Burning when urinating:     Blood in urine:        Psychiatric    Major depression:         Hematologic    Bleeding problems:    Problems with blood clotting too easily:        Skin    Rashes or ulcers:        Constitutional    Fever or chills:      PHYSICAL EXAM: Vitals:   12/08/21 1131  BP: 127/75  Pulse: 62  Weight: 163 lb 9.6 oz (74.2 kg)  Height: '5\' 7"'$  (1.702 m)    GENERAL: The patient is a well-nourished male, in no acute distress. The vital signs are documented above. CARDIOVASCULAR: 2+ radial pulses bilaterally.  2-3+ popliteal pulses bilaterally.  2+ dorsalis pedis pulses bilaterally PULMONARY: There is good air exchange  MUSCULOSKELETAL: There are no major deformities or cyanosis. NEUROLOGIC: No focal weakness or paresthesias are detected. SKIN: There are no ulcers or rashes noted. PSYCHIATRIC: The patient has a normal affect.  DATA:  Noninvasive studies from 11/25/2021 reveal elevated ankle arm indices bilaterally suggesting medial calcification  MEDICAL ISSUES: I discussed these findings at length with the patient and his granddaughter and wife by telephone.  He has normal physical exam.  I explained  that he does have thickening and probable calcification of his arterial walls but this is not causing any flow limitation.  Would not recommend any ongoing work-up or follow-up.  He is satisfied with this discussion will see [place] as needed   Rosetta Posner, MD [facility] [phone] Pager [phone]  Note: Portions of this report may have been transcribed using voice recognition software.  Every effort has been made to ensure accuracy; however, inadvertent computerized transcription errors may still be present.

## 2022-03-13 ENCOUNTER — Emergency Department (HOSPITAL_COMMUNITY): Payer: Medicare Other

## 2022-03-13 ENCOUNTER — Emergency Department (HOSPITAL_COMMUNITY)
Admission: EM | Admit: 2022-03-13 | Discharge: 2022-03-16 | Disposition: A | Discharge status: Home / Self Care | Payer: Medicare Other | Attending: Emergency Medicine | Admitting: Emergency Medicine

## 2022-03-13 ENCOUNTER — Other Ambulatory Visit: Payer: Self-pay

## 2022-03-13 DIAGNOSIS — S0081XA Abrasion of other part of head, initial encounter: Secondary | ICD-10-CM | POA: Insufficient documentation

## 2022-03-13 DIAGNOSIS — S3991XA Unspecified injury of abdomen, initial encounter: Secondary | ICD-10-CM | POA: Insufficient documentation

## 2022-03-13 DIAGNOSIS — Z96659 Presence of unspecified artificial knee joint: Secondary | ICD-10-CM | POA: Insufficient documentation

## 2022-03-13 DIAGNOSIS — R296 Repeated falls: Secondary | ICD-10-CM

## 2022-03-13 DIAGNOSIS — M6281 Muscle weakness (generalized): Secondary | ICD-10-CM | POA: Insufficient documentation

## 2022-03-13 DIAGNOSIS — W19XXXA Unspecified fall, initial encounter: Secondary | ICD-10-CM | POA: Diagnosis not present

## 2022-03-13 DIAGNOSIS — R2689 Other abnormalities of gait and mobility: Secondary | ICD-10-CM | POA: Diagnosis not present

## 2022-03-13 DIAGNOSIS — S0990XA Unspecified injury of head, initial encounter: Secondary | ICD-10-CM | POA: Diagnosis present

## 2022-03-13 DIAGNOSIS — Z7982 Long term (current) use of aspirin: Secondary | ICD-10-CM | POA: Diagnosis not present

## 2022-03-13 DIAGNOSIS — S298XXA Other specified injuries of thorax, initial encounter: Secondary | ICD-10-CM

## 2022-03-13 DIAGNOSIS — S8991XA Unspecified injury of right lower leg, initial encounter: Secondary | ICD-10-CM | POA: Diagnosis not present

## 2022-03-13 DIAGNOSIS — S299XXA Unspecified injury of thorax, initial encounter: Secondary | ICD-10-CM | POA: Insufficient documentation

## 2022-03-13 LAB — COMPREHENSIVE METABOLIC PANEL
ALT: 23 U/L (ref 0–44)
AST: 35 U/L (ref 15–41)
Albumin: 3.7 g/dL (ref 3.5–5.0)
Alkaline Phosphatase: 120 U/L (ref 38–126)
Anion gap: 9 (ref 5–15)
BUN: 24 mg/dL — ABNORMAL HIGH (ref 8–23)
CO2: 25 mmol/L (ref 22–32)
Calcium: 8.8 mg/dL — ABNORMAL LOW (ref 8.9–10.3)
Chloride: 105 mmol/L (ref 98–111)
Creatinine, Ser: 0.77 mg/dL (ref 0.61–1.24)
GFR, Estimated: >60 mL/min (ref >60)
Glucose, Bld: 85 mg/dL (ref 70–99)
Potassium: 4 mmol/L (ref 3.5–5.1)
Sodium: 139 mmol/L (ref 135–145)
Total Bilirubin: 0.6 mg/dL (ref 0.3–1.2)
Total Protein: 6.7 g/dL (ref 6.5–8.1)

## 2022-03-13 LAB — PHENOBARBITAL LEVEL: Phenobarbital: 13.2 ug/mL — ABNORMAL LOW (ref 15.0–40.0)

## 2022-03-13 LAB — CBC
HCT: 40.3 % (ref 39.0–52.0)
Hemoglobin: 13.1 g/dL (ref 13.0–17.0)
MCH: 33 pg (ref 26.0–34.0)
MCHC: 32.5 g/dL (ref 30.0–36.0)
MCV: 101.5 fL — ABNORMAL HIGH (ref 80.0–100.0)
Platelets: 167 10*3/uL (ref 150–400)
RBC: 3.97 MIL/uL — ABNORMAL LOW (ref 4.22–5.81)
RDW: 12.2 % (ref 11.5–15.5)
WBC: 7.5 10*3/uL (ref 4.0–10.5)
nRBC: 0 % (ref 0.0–0.2)

## 2022-03-13 MED ORDER — FENTANYL CITRATE PF 50 MCG/ML IJ SOSY
50.0000 ug | PREFILLED_SYRINGE | Freq: Once | INTRAMUSCULAR | Status: AC
  Administered 2022-03-13: 50 ug via INTRAVENOUS
  Filled 2022-03-13: qty 1

## 2022-03-13 MED ORDER — IOHEXOL 300 MG/ML  SOLN
80.0000 mL | Freq: Once | INTRAMUSCULAR | Status: AC | PRN
  Administered 2022-03-13: 80 mL via INTRAVENOUS

## 2022-03-13 MED ORDER — HYDROMORPHONE HCL 1 MG/ML IJ SOLN
0.5000 mg | Freq: Once | INTRAMUSCULAR | Status: AC
  Administered 2022-03-13: 0.5 mg via INTRAVENOUS
  Filled 2022-03-13: qty 1

## 2022-03-13 MED ORDER — SODIUM CHLORIDE 0.9 % IV BOLUS
500.0000 mL | Freq: Once | INTRAVENOUS | Status: AC
  Administered 2022-03-13: 500 mL via INTRAVENOUS

## 2022-03-13 NOTE — ED Triage Notes (Addendum)
PT states he fell last night and three times today and the last time he fell he felt a pop in his right knee. PT has had partial knee replacements bil in the past. PT is unsure why he fell so many times but states he falls often. Pt is predominantly wheelchair bound. PT lives at home with his wife. Pt states he probably bumped his head on something when he fell. He does have a small abrasion to the left forehead and a scrap to his left knee. Pt states he did not lose consciousness at any point.

## 2022-03-13 NOTE — ED Provider Notes (Signed)
[facility] EMERGENCY DEPARTMENT Provider Note   CSN: [id number] Arrival date & time: 03/13/22  1708     History  Chief Complaint  Patient presents with   Fall   Knee Pain    Samuel Olson is a 81 y.o. male.   Fall  Knee Pain Patient presents after a fall.  Reportedly fell a few times over the last 3 days.  Pain in right knee.  Previous knee replacement.  Also left chest pain.  No difficulty breathing.  Abrasion to forehead.  Unsteady at baseline and falls not but unusual.  At baseline ambulates some but mostly uses a wheelchair.  Does not think he would be able to do that now.  Wife also states she would not be able to move around.  Not on blood thinners     Home Medications Prior to Admission medications   Medication Sig Start Date End Date Taking? Authorizing Provider  aspirin EC 81 MG tablet Take 81 mg by mouth daily. Swallow whole.    [provider]  CALCIUM CITRATE PO Take 1-2 tablets by mouth daily.    [provider]  celecoxib (CELEBREX) 200 MG capsule Take 200 mg by mouth every evening. 03/20/17   [provider]  Cholecalciferol (VITAMIN D3 PO) Take 1,500 Units by mouth every morning.     [provider]  clarithromycin (BIAXIN) 250 MG tablet Take 250 mg by mouth 2 (two) times daily. Prior to dental work    [provider]  FERROUS SULFATE PO Take 270 mg by mouth daily.    [provider]  FISH OIL-VITAMIN D PO Take 1,400 mg by mouth 2 (two) times a day.    [provider]  fluticasone (FLONASE) 50 MCG/ACT nasal spray Place into both nostrils daily. prn    [provider]  Fluvoxamine Maleate 100 MG CP24 Take 1 capsule by mouth once.    [provider]  Glucosamine-Chondroit-Vit C-Mn (GLUCOSAMINE 1500 COMPLEX PO) Take 1 capsule by mouth 2 (two) times daily.    [provider]  HYDROcodone-acetaminophen (NORCO/VICODIN) 5-325 MG tablet Take 1 tablet by mouth every 4  (four) hours as needed. Patient taking differently: Take 1 tablet by mouth 2 (two) times daily. 04/10/19   Evalee Jefferson, PA-C  Multiple Vitamin (MULTIVITAMIN WITH MINERALS) TABS tablet Take 1 tablet by mouth daily.    [provider]  Multiple Vitamins-Minerals (PRESERVISION AREDS PO) Take 1 tablet by mouth 2 (two) times daily.    [provider]  omeprazole (PRILOSEC) 20 MG capsule TAKE 1 CAPSULE (20 MG TOTAL) BY MOUTH 2 (TWO) TIMES DAILY BEFORE A MEAL. 08/04/21   Mansouraty, Telford Nab., MD  OXYGEN Inhale 2 L into the lungs at bedtime.    [provider]  PHENobarbital (LUMINAL) 64.8 MG tablet Take 60 mg by mouth daily after breakfast. 02/27/17   [provider]  polycarbophil (FIBERCON) 625 MG tablet Take 1,400 mg by mouth 2 (two) times daily.     [provider]  polyethylene glycol (MIRALAX / GLYCOLAX) 17 g packet Take 17 g by mouth daily. 1/2 capful daily    [provider]  rOPINIRole (REQUIP) 0.25 MG tablet Take 3 mg by mouth 3 (three) times daily.    [provider]  simvastatin (ZOCOR) 40 MG tablet Take 40 mg by mouth at bedtime. for cholesterol 02/25/17   [provider]  SUPER B COMPLEX/C PO Take 1 tablet by mouth every morning.  [provider]      Allergies    Phenergan [promethazine], Augmentin [amoxicillin-pot clavulanate], Oxycodone, and Percocet [oxycodone-acetaminophen]    Review of Systems   Review of Systems  Physical Exam Updated Vital Signs BP 125/89   Pulse 72   Temp 98.6 F (37 C)   Resp 18   SpO2 99%  Physical Exam Vitals and nursing note reviewed.  HENT:     Head:     Comments: Mild abrasion to left forehead. Pulmonary:     Comments: Tenderness to left lateral chest wall.  No deformity.  Equal breath sounds. Chest:     Chest wall: Tenderness present.  Musculoskeletal:        General: Tenderness present.     Comments: Right knee swelling tenderness.  Decreased range of  motion.  Large effusion/likely hemarthrosis.  Decreased range of motion.  Neurovascular intact in feet.  Skin:    General: Skin is warm.  Neurological:     Mental Status: He is alert. Mental status is at baseline.     ED Results / Procedures / Treatments   Labs (all labs ordered are listed, but only abnormal results are displayed) Labs Reviewed  CBC - Abnormal; Notable for the following components:      Result Value   RBC 3.97 (*)    MCV 101.5 (*)    All other components within normal limits  COMPREHENSIVE METABOLIC PANEL - Abnormal; Notable for the following components:   BUN 24 (*)    Calcium 8.8 (*)    All other components within normal limits  PHENOBARBITAL LEVEL - Abnormal; Notable for the following components:   Phenobarbital 13.2 (*)    All other components within normal limits  URINALYSIS, ROUTINE W REFLEX MICROSCOPIC    EKG None  Radiology CT CHEST ABDOMEN PELVIS W CONTRAST  Result Date: 03/13/2022 CLINICAL DATA:  Poly trauma.  Multiple falls yesterday EXAM: CT CHEST, ABDOMEN, AND PELVIS WITH CONTRAST TECHNIQUE: Multidetector CT imaging of the chest, abdomen and pelvis was performed following the standard protocol during bolus administration of intravenous contrast. RADIATION DOSE REDUCTION: This exam was performed according to the departmental dose-optimization program which includes automated exposure control, adjustment of the mA and/or kV according to patient size and/or use of iterative reconstruction technique. CONTRAST:  60m OMNIPAQUE IOHEXOL 300 MG/ML  SOLN COMPARISON:  CT 12/18/2018 FINDINGS: CT CHEST FINDINGS Cardiovascular: No acute finding with aorta great vessels. No pericardial fluid. No mediastinal hematoma. Mediastinum/Nodes: Trachea and esophagus normal. No mediastinal lymphadenopathy. Lungs/Pleura: No pulmonary contusion or pneumothorax. No pleural fluid. There is chronic bronchiectasis in the RIGHT middle lobe and lingula advanced from comparison exam.  There is multiple peripheral pulmonary nodules which also advanced but similar to comparison exam. Largest nodule measures 10 mm in the RIGHT lower lobe (image 102/series 3) increased from 7 mm on comparison CT. Musculoskeletal: No acute rib fracture. Several posterior RIGHT healed rib fractures with callus formation. Mild callus formation around several lateral LEFT rib fractures are favor subacute (image 114 and 172 of series 4) CT ABDOMEN AND PELVIS FINDINGS Hepatobiliary: Postcholecystectomy. The external bile duct measures 10 mm. There is a common bile duct stent on comparison exam. Pancreatic duct measures 7 mm compared to 5 mm. Pancreas: Pancreas is normal. No ductal dilatation. No pancreatic inflammation. Spleen: Spleen is normal Adrenals/urinary tract: Adrenal glands kidneys are normal. Bladder intact. Stomach/Bowel: No evidence of bowel injury. Vascular/Lymphatic: Abdominal aorta is normal caliber. There is no retroperitoneal or periportal lymphadenopathy. No pelvic lymphadenopathy.  Reproductive: Un remark Other: No free fluid. Musculoskeletal: Posterior lumbar fusion. Remote RIGHT transverse process fractures. IMPRESSION: Chest Impression: 1. No clear evidence acute thoracic trauma. 2. Bilateral chronic and subacute rib fractures. 3. 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 Favor chronic infectious or inflammatory process. As no recent comparison available, recommend follow-up CT in 3 to 6 months. Abdomen / Pelvis Impression: 1. Increase in diameter of external bile duct and pancreatic duct. Prior bile duct stent was in place. Recommend correlation with surgical/oncological history. Favor benign postsurgical change. 2. No evidence of acute trauma in the abdomen pelvis. 3. Evidence of remote RIGHT transverse process fractures. Posterior lumbar fusion. Electronically Signed   By: Suzy Bouchard M.D.   On: 03/13/2022 22:03   DG Ribs Unilateral W/Chest  Left  Result Date: 03/13/2022 CLINICAL DATA:  Trauma, fall EXAM: LEFT RIBS AND CHEST - 3+ VIEW COMPARISON:  Chest radiographs done on 11/13/2021 FINDINGS: Transverse diameter of heart is increased. Central pulmonary vessels are prominent. Linear densities seen in the lower lung fields. Deformity is seen in the posterolateral aspect of left seventh, eighth and ninth ribs. There is minimal 1-2 mm offset in alignment in the fracture of the eighth rib. There is questionable minimal deformity in the lateral aspect of left sixth rib. Left lateral CP angle is indistinct. There is no definite pneumothorax. Right apex is not included in its entirety in the images. IMPRESSION: Recent fractures are seen in the left seventh eighth and ninth ribs with minimal offset in the alignment in the fracture of eighth rib. Cardiomegaly. There are linear densities in both lower lung fields suggesting subsegmental atelectasis. Possible minimal left pleural effusion. Electronically Signed   By: Elmer Picker M.D.   On: 03/13/2022 19:26   DG Knee Complete 4 Views Right  Result Date: 03/13/2022 CLINICAL DATA:  Fall and right knee pain. EXAM: RIGHT KNEE - COMPLETE 4+ VIEW COMPARISON:  Right knee radiograph dated 11/13/2021. FINDINGS: There is medial compartment arthroplasty. The arthroplasty components appear intact and in anatomic alignment. There is no acute fracture or dislocation. The bones are osteopenic. There is a small suprapatellar effusion. There is soft tissue swelling anterior to the knee which may represent a hematoma or prepatellar bursitis. IMPRESSION: 1. No acute fracture or dislocation. 2. Small suprapatellar effusion. 3. Soft tissue swelling anterior to the knee may represent a hematoma or prepatellar bursitis. 4. Medial compartment arthroplasty. Electronically Signed   By: Anner Crete M.D.   On: 03/13/2022 19:13   CT HEAD WO CONTRAST (5MM)  Result Date: 03/13/2022 CLINICAL DATA:  Trauma EXAM: CT HEAD  WITHOUT CONTRAST TECHNIQUE: Contiguous axial images were obtained from the base of the skull through the vertex without intravenous contrast. RADIATION DOSE REDUCTION: This exam was performed according to the departmental dose-optimization program which includes automated exposure control, adjustment of the mA and/or kV according to patient size and/or use of iterative reconstruction technique. COMPARISON:  11/13/2021 FINDINGS: Brain: No acute intracranial findings are seen in noncontrast CT brain. There are no signs of bleeding within the cranium. Ventricles are not dilated. Cortical sulci are prominent. There is decreased density in the periventricular white matter. Vascular: Unremarkable. Skull: No fracture is seen in the calvarium. Sinuses/Orbits: There is mucosal thickening in the ethmoid sinus. There is fluid in the most of the air cells in the left mastoid. Similar finding was seen in the previous study. Other: None. IMPRESSION: No acute intracranial findings are seen in noncontrast CT brain. Atrophy. Small-vessel  disease. Chronic ethmoid sinusitis.  Left mastoid effusion. Electronically Signed   By: Elmer Picker M.D.   On: 03/13/2022 19:00    Procedures Procedures    Medications Ordered in ED Medications  sodium chloride 0.9 % bolus 500 mL (0 mLs Intravenous Stopped 03/13/22 1924)  fentaNYL (SUBLIMAZE) injection 50 mcg (50 mcg Intravenous Given 03/13/22 1819)  HYDROmorphone (DILAUDID) injection 0.5 mg (0.5 mg Intravenous Given 03/13/22 2008)  iohexol (OMNIPAQUE) 300 MG/ML solution 80 mL (80 mLs Intravenous Contrast Given 03/13/22 2147)    ED Course/ Medical Decision Making/ A&P                           Medical Decision Making Amount and/or Complexity of Data Reviewed Labs: ordered. Radiology: ordered.  Risk Prescription drug management.   Patient presents after fall.  Has had several falls.  Hit head knee and chest.  Complaining of pain in left chest and right knee.  Large  hematoma around likely in right knee.  Decreased range of motion.  X-ray reassuring without definite fracture.  Rib films showed possible rib fractures.  CT scan done due to tenderness and age.  Fortunately no rib fractures seen but still does have tenderness.  Does have generalized weakness but also would not be able ambulate at this time.  At baseline is mostly wheelchair.  Patient's wife would not be able to take care of him at this time.  Will discuss with unassigned medicine for admission for pain control PT OT possible placement.  Does wear oxygen at night.  Patient has been seen by Dr. Posey Pronto who thinks that he does not have any admission criteria.  Thinks patient to be seen in the ER by PT OT and work on placement/home health        Final Clinical Impression(s) / ED Diagnoses Final diagnoses:  Fall, initial encounter  Injury of right knee, initial encounter  Blunt chest trauma, initial encounter    Rx / DC Orders ED Discharge Orders     None         Davonna Belling, MD 03/13/22 Minus Breeding    Davonna Belling, MD 03/14/22 [phone]

## 2022-03-13 NOTE — ED Notes (Signed)
Pt wife requested that pt has 2L O2 on since he was falling asleep. Wife states he wears it when he sleep. O2 placed on patient at 2L.

## 2022-03-14 DIAGNOSIS — R296 Repeated falls: Secondary | ICD-10-CM | POA: Diagnosis not present

## 2022-03-14 LAB — URINALYSIS, ROUTINE W REFLEX MICROSCOPIC
Bilirubin Urine: NEGATIVE
Glucose, UA: NEGATIVE mg/dL
Hgb urine dipstick: NEGATIVE
Ketones, ur: 20 mg/dL — AB
Leukocytes,Ua: NEGATIVE
Nitrite: NEGATIVE
Protein, ur: NEGATIVE mg/dL
Specific Gravity, Urine: 1.034 — ABNORMAL HIGH (ref 1.005–1.030)
pH: 6 (ref 5.0–8.0)

## 2022-03-14 MED ORDER — HYDROCODONE-ACETAMINOPHEN 5-325 MG PO TABS: 1.0000 | ORAL_TABLET | ORAL | Status: DC | PRN

## 2022-03-14 MED ORDER — SIMVASTATIN 20 MG PO TABS
40.0000 mg | ORAL_TABLET | Freq: Every day | ORAL | Status: DC
  Administered 2022-03-15 (×2): 40 mg via ORAL
  Filled 2022-03-14 (×2): qty 2

## 2022-03-14 MED ORDER — PRAMIPEXOLE DIHYDROCHLORIDE 1 MG PO TABS
1.0000 mg | ORAL_TABLET | Freq: Three times a day (TID) | ORAL | Status: DC
  Administered 2022-03-14 – 2022-03-15 (×6): 1 mg via ORAL
  Filled 2022-03-14 (×7): qty 1

## 2022-03-14 MED ORDER — CELECOXIB 200 MG PO CAPS
200.0000 mg | ORAL_CAPSULE | Freq: Every evening | ORAL | Status: DC
  Administered 2022-03-14 – 2022-03-15 (×2): 200 mg via ORAL
  Filled 2022-03-14 (×2): qty 1

## 2022-03-14 MED ORDER — PHENOBARBITAL 60 MG PO TABS
60.0000 mg | ORAL_TABLET | Freq: Every day | ORAL | Status: DC
  Administered 2022-03-15 (×2): 60 mg via ORAL
  Filled 2022-03-14 (×2): qty 1

## 2022-03-14 MED ORDER — ROPINIROLE HCL 1 MG PO TABS
2.0000 mg | ORAL_TABLET | Freq: Three times a day (TID) | ORAL | Status: DC
  Administered 2022-03-14 – 2022-03-15 (×5): 2 mg via ORAL
  Filled 2022-03-14 (×5): qty 2

## 2022-03-14 MED ORDER — ASPIRIN 81 MG PO TBEC
81.0000 mg | DELAYED_RELEASE_TABLET | Freq: Every day | ORAL | Status: DC
  Administered 2022-03-14 – 2022-03-15 (×2): 81 mg via ORAL
  Filled 2022-03-14 (×2): qty 1

## 2022-03-14 MED ORDER — VITAMIN D 25 MCG (1000 UNIT) PO TABS
5000.0000 [IU] | ORAL_TABLET | Freq: Every morning | ORAL | Status: DC
  Administered 2022-03-14 – 2022-03-15 (×2): 5000 [IU] via ORAL
  Filled 2022-03-14 (×2): qty 5

## 2022-03-14 MED ORDER — OLANZAPINE 5 MG PO TBDP
5.0000 mg | ORAL_TABLET | ORAL | Status: AC
  Administered 2022-03-14: 5 mg via ORAL
  Filled 2022-03-14: qty 1

## 2022-03-14 MED ORDER — PANTOPRAZOLE SODIUM 40 MG PO TBEC
40.0000 mg | DELAYED_RELEASE_TABLET | Freq: Every day | ORAL | Status: DC
  Administered 2022-03-14 – 2022-03-15 (×2): 40 mg via ORAL
  Filled 2022-03-14 (×2): qty 1

## 2022-03-14 NOTE — ED Provider Notes (Signed)
Pt has been calling out constantly. Requesting to be ambulated however he is a fall risk.  We will provide patient with 1 dose of Zyprexa.    Tedd Sias, [place] 03/14/22 [place], DO 03/16/22 [phone]

## 2022-03-14 NOTE — ED Notes (Addendum)
Larene Beach the Education officer, museum, stated that the pts wife told the equipment company to hold off on delivering equipment until tomorrow. Unsure of what time equipment will be dropped off. Pt will have to stay here until equipment gets dropped off, due to the fact that the pt cant go back until the equipment is dropped off. Social worker Soil scientist of situation.

## 2022-03-14 NOTE — Discharge Planning (Signed)
    Durable Medical Equipment  (From admission, onward)           Start     Ordered   03/14/22 1441  For home use only DME Hospital bed  Once       Question Answer Comment  Length of Need Lifetime   Bed type Semi-electric   Hoyer Lift Yes   Trapeze Bar Yes   Support Surface: Gel Overlay      03/14/22 1441

## 2022-03-14 NOTE — Progress Notes (Signed)
CSW spoke with patients wife and updated her about the hospital bed and hoyer lift. Patients wife stated she was aware and the company called her to deliver the equipment. Patients wife stated her husband needs SNF and the PCP can fax over a letter stating patient has dementia and cannot make his own decisions. CSW stated that was fine but reminded patients wife about what her plan will be after rehab since she cannot afford for him to stay long term. Patients wife stated she did not know. Patients wife also demanded patient be placed in [place]. CSW stated she is not sure if she can place out of state. Patients wife then stated well patient can go to [place]. CSW explained to patients wife she can do the referral but wanted to keep in mind that patient has been yelling all day and has also refused SNF. CSW stated if patient continues to have those behaviors at [facility] facility that they might not keep him or they might recommended higher level of care such as memory care. CSW told patients wife she would have to be prepared to take him home at anytime if he doesn't participate in rehab. Patients wife stated she will bring him home. CSW asked patients wife if she was sure because CSW can start the referral and we would have to wait and see what facility can accept and if insurance will pay. Patients wife stated she has no chose because she knows he will refuse and be belligerent at the SNF. Patients wife stated she needed to clear a space for the bed and the equipment can be delivered tomorrow. Patiens wife also requested to speak with nursing staff. CSW stated she would pass the message along. CSW will speak with patients wife in the morning.

## 2022-03-14 NOTE — Evaluation (Signed)
Physical Therapy Evaluation Patient Details Name: Samuel Olson MRN: [id number] DOB: Jun 23, 1941 Today's Date: 03/14/2022  History of Present Illness  Pt is an 81 y/o male presenting on 6/11 after fall at home.  CT negative, found with L rib 7, 8, 9 fractures, R knee with suprapatellar effusion but no fracture or dislocation. 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  Clinical Impression  Pt admitted secondary to problem above with deficits below. Pt requiring max to total A +2 for bed mobility, transfers, and taking side steps at EOB. Pt with increased pain in RLE which limited mobility tasks. Some confusion noted throughout, however, no family present to determine baseline. Pt reports he was able to perform transfers and ADL tasks without assist prior to falling. Recommending SNF level therapies to address current deficits. Will continue to follow acutely.        Recommendations for follow up therapy are one component of a multi-disciplinary discharge planning process, led by the attending physician.  Recommendations may be updated based on patient status, additional functional criteria and insurance authorization.  Follow Up Recommendations Skilled nursing-short term rehab (<3 hours/day)    Assistance Recommended at Discharge Frequent or constant Supervision/Assistance  Patient can return home with the following  Two people to help with walking and/or transfers;Two people to help with bathing/dressing/bathroom;Assistance with cooking/housework;Help with stairs or ramp for entrance;Assist for transportation;Direct supervision/assist for financial management;Direct supervision/assist for medications management    Equipment Recommendations Hospital bed;Other (comment) (hoyer lift with pad)  Recommendations for Other Services        Functional Status Assessment Patient has had a recent decline in their functional status and demonstrates the ability to make significant improvements in function in a reasonable and predictable amount of time.     Precautions / Restrictions Precautions Precautions: Fall Restrictions Weight Bearing Restrictions: No      Mobility  Bed Mobility Overal bed mobility: Needs Assistance Bed Mobility: Supine to Sit, Sit to Supine     Supine to sit: +2 for physical assistance, +2 for safety/equipment, Total assist Sit to supine: Total assist, +2 for physical assistance, +2 for safety/equipment   General bed mobility comments: able to move L LE minimally towards EOB but otherwise required total assist +2 to transition to/from edge of stretcher    Transfers Overall transfer level: Needs assistance Equipment used: Rolling walker (2 wheels) Transfers: Sit to/from Stand Sit to Stand: Max assist, +2 physical assistance, +2 safety/equipment           General transfer comment: to power up and steady from edge of stretcher with heavy posterior lean. Stood X2    Ambulation/Gait Ambulation/Gait assistance: Max assist, +2 physical assistance Gait Distance (Feet): 1 Feet Assistive device: Rolling walker (2 wheels)         General Gait Details: Able to take side steps at EOB with max A +2. Heavy posterior lean and required cues for sequencing.  Stairs            Wheelchair Mobility    Modified Rankin (Stroke Patients Only)       Balance Overall balance assessment: Needs assistance Sitting-balance support: Feet supported, No upper extremity supported Sitting balance-Leahy Scale: Fair Sitting balance - Comments: relies on external support statically   Standing balance support: During functional activity, Bilateral upper extremity supported Standing balance-Leahy Scale: Poor Standing balance comment: relies on BUE and external  support                              Pertinent Vitals/Pain Pain Assessment Pain Assessment: Faces Faces Pain Scale: Hurts even more Pain Location: R knee Pain Descriptors / Indicators: Discomfort, Guarding Pain Intervention(s): Limited activity within patient's tolerance, Monitored during session, Repositioned    Home Living Family/patient expects to be discharged to:: Private residence Living Arrangements: Spouse/significant other;Other (Comment) (grandson) Available Help at Discharge: Family Type of Home: Mobile home Home Access: Stairs to enter Entrance Stairs-Rails: Right;Left;Can reach both Entrance Stairs-Number of Steps: 2   Home Layout: One level Home Equipment: Conservation officer, nature (2 wheels);Cane - quad Additional Comments: reports spouse assists with meals, grandson "has trouble with his legs"    Prior Function Prior Level of Function : Needs assist;Patient poor historian/Family not available;History of Falls (last six months)       Physical Assist : Mobility (physical);ADLs (physical) Mobility (physical): Bed mobility;Transfers;Gait;Stairs ADLs (physical): IADLs Mobility Comments: reports using WC mostly but some RW at home, reports 3 falls on day of admission. Reports he could perform transfers independently ADLs Comments: reports able to complete basic ADLs, spouse assists with meals     Hand Dominance   Dominant Hand: Right    Extremity/Trunk Assessment   Upper Extremity Assessment Upper Extremity Assessment: Defer to OT evaluation    Lower Extremity Assessment Lower Extremity Assessment: RLE deficits/detail;Generalized weakness;LLE deficits/detail RLE Deficits / Details: Increased bruising and swelling at R knee. Noted blister on top of R knee LLE Deficits / Details: Maintained in internally rotated position    Cervical / Trunk Assessment Cervical / Trunk Assessment: Kyphotic  Communication   Communication: HOH  Cognition Arousal/Alertness: Awake/alert Behavior During Therapy:  Agitated Overall Cognitive Status: Impaired/Different from baseline Area of Impairment: Orientation, Attention, Memory, Following commands, Safety/judgement, Awareness, Problem solving                 Orientation Level: Disoriented to, Time Current Attention Level: Sustained Memory: Decreased recall of precautions, Decreased short-term memory Following Commands: Follows one step commands inconsistently, Follows one step commands with increased time Safety/Judgement: Decreased awareness of safety, Decreased awareness of deficits Awareness: Intellectual Problem Solving: Slow processing, Decreased initiation, Difficulty sequencing, Requires verbal cues, Requires tactile cues General Comments: pt initally reports January, poor awareness to safety and deficits, poor recall by consistently asking to get up but no aware he needs assistance, incontinent of bladder voicing "I didn't know that it- condom cath- came off"        General Comments      Exercises     Assessment/Plan    PT Assessment Patient needs continued PT services  PT Problem List Decreased strength;Decreased activity tolerance;Decreased balance;Decreased mobility;Decreased knowledge of use of DME;Decreased knowledge of precautions;Pain;Decreased cognition;Decreased safety awareness       PT Treatment Interventions DME instruction;Gait training;Functional mobility training;Therapeutic activities;Therapeutic exercise;Neuromuscular re-education;Balance training;Wheelchair mobility training    PT Goals (Current goals can be found in the Care Plan section)  Acute Rehab PT Goals Patient Stated Goal: to get out of the bed PT Goal Formulation: With patient Time For Goal Achievement: 03/28/22 Potential to Achieve Goals: Fair    Frequency Min 2X/week     Co-evaluation PT/OT/SLP Co-Evaluation/Treatment: Yes Reason for Co-Treatment: For patient/therapist safety;To address functional/ADL transfers PT goals addressed  during session: Mobility/safety with mobility;Balance OT goals addressed during session: ADL's and self-care       AM-PAC PT "6 Clicks"  Mobility  Outcome Measure Help needed turning from your back to your side while in a flat bed without using bedrails?: Total Help needed moving from lying on your back to sitting on the side of a flat bed without using bedrails?: Total Help needed moving to and from a bed to a chair (including a wheelchair)?: Total Help needed standing up from a chair using your arms (e.g., wheelchair or bedside chair)?: Total Help needed to walk in hospital room?: Total Help needed climbing 3-5 steps with a railing? : Total 6 Click Score: 6    End of Session Equipment Utilized During Treatment: Gait belt Activity Tolerance: Patient tolerated treatment well Patient left: in bed (on stretcher in ED hallway) Nurse Communication: Mobility status PT Visit Diagnosis: Unsteadiness on feet (R26.81);Muscle weakness (generalized) (M62.81);Repeated falls (R29.6);History of falling (Z91.81);Difficulty in walking, not elsewhere classified (R26.2)    Time: 6579-0383 PT Time Calculation (min) (ACUTE ONLY): 19 min   Charges:   PT Evaluation $PT Eval Moderate Complexity: 1 Mod          Reuel Derby, PT, DPT  Acute Rehabilitation Services  Office: [phone]   Rudean Hitt 03/14/2022, 11:42 AM

## 2022-03-14 NOTE — Evaluation (Signed)
Occupational Therapy Evaluation Patient Details Name: Samuel Olson MRN: [id number] DOB: July 21, 1941 Today's Date: 03/14/2022   History of Present Illness Pt is an 81 y/o male presenting on 6/11 after fall at home.  CT negative, found with L rib 7, 8, 9 fractures, R knee with suprapatellar effusion but no fracture or dislocation. 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   Clinical Impression   PTA patient reports using wc for mobility mostly but at times using RW, completing ADLs with independence and some assist from family for IADLs. He was admitted for above and presents with problem list below, including R LE/knee pain, impaired balance, decreased activity tolerance, generalized weakness, and impaired cognition. He is disoriented to time, follows simple commands with increased time but noted difficulty with awareness, problem solving and recall.  He requires max assist +2 for bed mobility and transfers, min to total assist +2 for ADLs.  Based of performance today, pt will best benefit from continued OT services acutely and after dc at SNF level to optimize safety with ADLs and mobility.      Recommendations for follow up therapy are one component of a multi-disciplinary discharge planning process, led by the attending physician.  Recommendations may be updated based on patient status, additional functional criteria and insurance authorization.   Follow Up Recommendations  Skilled nursing-short term rehab (<3 hours/day)    Assistance Recommended at Discharge Frequent or constant Supervision/Assistance  Patient can return home with the following Two people to help with walking and/or transfers;Two people to help with bathing/dressing/bathroom;Help with stairs or ramp for entrance;Assist for transportation;Direct supervision/assist for  financial management;Direct supervision/assist for medications management;Assistance with cooking/housework    Functional Status Assessment  Patient has had a recent decline in their functional status and demonstrates the ability to make significant improvements in function in a reasonable and predictable amount of time.  Equipment Recommendations  Other (comment) (defer)    Recommendations for Other Services       Precautions / Restrictions Precautions Precautions: Fall Restrictions Weight Bearing Restrictions: No      Mobility Bed Mobility Overal bed mobility: Needs Assistance Bed Mobility: Supine to Sit, Sit to Supine     Supine to sit: +2 for physical assistance, +2 for safety/equipment, Total assist Sit to supine: Total assist, +2 for physical assistance, +2 for safety/equipment   General bed mobility comments: able to move L LE minimally towards EOB but otherwise required total assist +2 to transition to/from edge of stretcher    Transfers Overall transfer level: Needs assistance Equipment used: Rolling walker (2 wheels) Transfers: Sit to/from Stand Sit to Stand: Max assist, +2 physical assistance, +2 safety/equipment           General transfer comment: to power up and steady from edge of stretcher with heavy posterior lean      Balance Overall balance assessment: Needs assistance Sitting-balance support: Feet supported, No upper extremity supported Sitting balance-Leahy Scale: Fair Sitting balance - Comments: relies on external support statically   Standing balance support: During functional activity, Bilateral upper extremity supported Standing balance-Leahy Scale: Poor Standing balance comment: relies on BUE and external support                           ADL either performed or assessed with clinical judgement   ADL Overall ADL's : Needs assistance/impaired  Grooming: Minimal assistance;Sitting           Upper Body Dressing :  Moderate assistance;Sitting   Lower Body Dressing: Total assistance;+2 for safety/equipment;+2 for physical assistance;Sit to/from Health and safety inspector Details (indicate cue type and reason): deferred due to safety, side stepping towards HOB with max assist +2 Toileting- Clothing Manipulation and Hygiene: Total assistance;+2 for physical assistance;+2 for safety/equipment;Sit to/from stand       Functional mobility during ADLs: +2 for physical assistance;+2 for safety/equipment;Maximal assistance       Vision   Vision Assessment?: No apparent visual deficits     Perception     Praxis      Pertinent Vitals/Pain Pain Assessment Pain Assessment: Faces Faces Pain Scale: Hurts even more Pain Location: R knee Pain Descriptors / Indicators: Discomfort, Guarding Pain Intervention(s): Limited activity within patient's tolerance, Monitored during session, Repositioned     Hand Dominance Right   Extremity/Trunk Assessment Upper Extremity Assessment Upper Extremity Assessment: Generalized weakness   Lower Extremity Assessment Lower Extremity Assessment: Defer to PT evaluation   Cervical / Trunk Assessment Cervical / Trunk Assessment: Kyphotic   Communication Communication Communication: HOH   Cognition Arousal/Alertness: Awake/alert Behavior During Therapy: Agitated Overall Cognitive Status: Impaired/Different from baseline Area of Impairment: Orientation, Attention, Memory, Following commands, Safety/judgement, Awareness, Problem solving                 Orientation Level: Disoriented to, Time Current Attention Level: Sustained Memory: Decreased recall of precautions, Decreased short-term memory Following Commands: Follows one step commands inconsistently, Follows one step commands with increased time Safety/Judgement: Decreased awareness of safety, Decreased awareness of deficits Awareness: Intellectual Problem Solving: Slow processing, Decreased initiation,  Difficulty sequencing, Requires verbal cues, Requires tactile cues General Comments: pt initally reports January, poor awareness to safety and deficits, poor recall by consistently asking to get up but no aware he needs assistance, incontinent of bladder voicing "I didn't know that it- condom cath- came off"     General Comments       Exercises     Shoulder Instructions      Home Living Family/patient expects to be discharged to:: Private residence Living Arrangements: Spouse/significant other;Other (Comment) (grandson) Available Help at Discharge: Family Type of Home: Mobile home Home Access: Stairs to enter     Home Layout: One level     Bathroom Shower/Tub: Walk-in Psychologist, prison and probation services: Standard     Home Equipment: Conservation officer, nature (2 wheels);Cane - quad   Additional Comments: reports spouse assists with meals, grandson "has trouble with his legs"      Prior Functioning/Environment Prior Level of Function : Needs assist;Patient poor historian/Family not available;History of Falls (last six months)       Physical Assist : Mobility (physical);ADLs (physical) Mobility (physical): Bed mobility;Transfers;Gait;Stairs ADLs (physical): IADLs Mobility Comments: reports using WC mostly but some RW at home, reports 3 falls on day of admission ADLs Comments: reports able to complete basic ADLs, spouse assists with meals        OT Problem List: Decreased strength;Decreased activity tolerance;Impaired balance (sitting and/or standing);Decreased safety awareness;Decreased cognition;Decreased coordination;Decreased knowledge of use of DME or AE;Decreased knowledge of precautions;Pain      OT Treatment/Interventions: Self-care/ADL training;DME and/or AE instruction;Therapeutic activities;Cognitive remediation/compensation;Patient/family education;Balance training;Therapeutic exercise    OT Goals(Current goals can be found in the care plan section) Acute Rehab OT Goals Patient  Stated Goal: get out of this bed OT Goal Formulation: With patient Time For Goal Achievement:  03/28/22 Potential to Achieve Goals: Fair  OT Frequency: Min 2X/week    Co-evaluation PT/OT/SLP Co-Evaluation/Treatment: Yes Reason for Co-Treatment: For patient/therapist safety;To address functional/ADL transfers   OT goals addressed during session: ADL's and self-care      AM-PAC OT "6 Clicks" Daily Activity     Outcome Measure Help from another person eating meals?: A Little Help from another person taking care of personal grooming?: A Little Help from another person toileting, which includes using toliet, bedpan, or urinal?: Total Help from another person bathing (including washing, rinsing, drying)?: A Lot Help from another person to put on and taking off regular upper body clothing?: A Lot Help from another person to put on and taking off regular lower body clothing?: Total 6 Click Score: 12   End of Session Equipment Utilized During Treatment: Gait belt;Rolling walker (2 wheels) Nurse Communication: Mobility status;Other (comment) (condom cath)  Activity Tolerance: Patient tolerated treatment well Patient left: in bed;with call bell/phone within reach  OT Visit Diagnosis: Other abnormalities of gait and mobility (R26.89);Muscle weakness (generalized) (M62.81);Pain;Other symptoms and signs involving cognitive function;Repeated falls (R29.6) Pain - Right/Left: Right Pain - part of body: Knee                Time: 4765-4650 OT Time Calculation (min): 20 min Charges:  OT General Charges $OT Visit: 1 Visit OT Evaluation $OT Eval Moderate Complexity: 1 Mod  Jolaine Artist, OT Acute Rehabilitation Services Office [phone]   Delight Stare 03/14/2022, 11:04 AM

## 2022-03-14 NOTE — ED Notes (Signed)
Marcio Hoque (son) number is the person for contact for all updates.

## 2022-03-14 NOTE — Progress Notes (Signed)
Transition of Care [facility]) - Emergency Department Mini Assessment   Patient Details  Name: Samuel Olson MRN: [id number] Date of Birth: November 03, 1940  [facility] CM/SW Contact:    Fuller Mandril, RN Phone Number: 03/14/2022, 3:14 PM   Clinical Narrative: Pt currently active with [facility] for [place] services as confirmed by [facility] with Tommi Rumps of [facility].  Pt will resume [place] services of RN. PT, SW.  DME needs identified at this time include hospital bed and hoyer lift.  [employer] will deliver DME to home.    ED Mini Assessment: What brought you to the Emergency Department? : (P) "I fell at home and hurt my knee."  Barriers to Discharge: (P) Equipment Delay     Means of departure: (P) Ambulance  Interventions which prevented an admission or readmission: [place] or Services, DME Provided    Patient Contact and Communications        ,          Patient states their goals for this hospitalization and ongoing recovery are:: (P) Go home and get better      Admission diagnosis:  knee injury Patient Active Problem List   Diagnosis Date Noted   Frequent falls 03/14/2022   Demand ischemia ([place]) 11/14/2021   RLS (restless legs syndrome) 11/14/2021   HLD (hyperlipidemia) 11/14/2021   CAP (community acquired pneumonia) 11/13/2021   Sleep apnea 11/13/2021   Chronic constipation 07/17/2021   Dyspepsia 07/17/2021   Gastroesophageal reflux disease 03/22/2020   Biliary tract cancer ([place]) 06/28/2019   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   Generalized abdominal pain 06/28/2019   Abdominal cramping 06/28/2019   History of colonic polyps 06/28/2019   Biliary obstruction 11/30/2018   Choledocholithiasis 11/30/2018   Deep vein thrombosis (DVT) of right lower extremity ([place]) 11/30/2018   Seizures ([place]) 11/29/2018   Anxiety 11/29/2018   Elevated LFTs 11/29/2018   S/P right unicompartmental knee replacement 04/17/2017   PCP:   [place]:   [id number] [place], [place] [id number]: [phone]: [phone].

## 2022-03-14 NOTE — ED Notes (Signed)
Samuel Olson wife [phone] requesting an update

## 2022-03-14 NOTE — Consult Note (Signed)
History and Physical    Samuel Olson:[id number] DOB: 1941-06-18 DOA: 03/13/2022  PCP: Elyn Aquas  Patient coming from: Home  I have personally briefly reviewed patient's old medical records in [place]  Chief Complaint: Fall at home  HPI: Samuel Olson is a 81 y.o. 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  Patient states that he mostly uses a wheelchair for transport.  He occasionally uses a walker in the home.  He says that he falls frequently at baseline.  He fell last night hitting his right knee resulting in swelling and hematoma.  He says he did not lose consciousness.  He has not had any chest pain.  He does not expand further on circumstances surrounding his falls.  He says he just gets up and falls in his home frequently.  ED Course  Labs/Imaging on admission: I have personally reviewed following labs and imaging studies.  Initial vitals showed BP 110/73, pulse 70, RR 16, temp 98.6 F, SPO2 95% on room air.  Labs show WBC 7.5, hemoglobin 13.1, platelets 167,000, sodium 139, potassium 4.0, bicarb 25, BUN 24, creatinine 0.77, serum glucose 85, LFTs within normal limits, phenobarbital level 13.2.  CT head without contrast negative for acute intracranial findings.  Chest and left rib x-rays showed left 7 8 and ninth rib fractures.  Right knee x-ray negative for acute fracture or dislocation.  Small suprapatellar effusion seen.  CT chest/abdomen/pelvis with contrast negative for evidence of acute thoracic trauma.  Bilateral chronic and subacute rib fractures noted.  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  Postsurgical increase in diameter of external bile duct and pancreatic duct with prior bile duct stent seen.  No evidence of acute trauma in the abdomen/pelvis.  Remote right transverse process fracture and posterior lumbar fusion seen.  Patient was given 500 cc normal saline, IV fentanyl 50 mcg, IV Dilaudid 0.5 mg.  The hospitalist service was consulted to evaluate for possible admission.  Review of Systems: All systems reviewed and are negative except as documented in history of present illness above.   Past Medical History:  Diagnosis Date   Anxiety    Arthritis    Biliary tract cancer ([place]) 06/28/2019   Cervical myelopathy ([facility])    Complication of anesthesia    Elevated LFTs    CBD dilated   GERD (gastroesophageal reflux disease)    Headache    Hyperlipidemia    IBS (irritable bowel syndrome)    On home oxygen therapy    "2L when I sleep" (04/17/2017)   Pneumonia    PONV (postoperative nausea and vomiting)    Scoliosis    Seizures ([place])    Seizures ([place])    " Haven't had one since 1976"  per spouse.   Sleep apnea    Uses O2 at 2L while sleeping; "can't tolerate CPAP OR BIPAP" * (04/17/2017)   Vertigo    Wears glasses     Past Surgical History:  Procedure Laterality Date   BACK SURGERY     BILIARY STENT PLACEMENT  12/10/2018   Procedure: BILIARY STENT PLACEMENT;  Surgeon: Irving Copas., MD;  Location: [facility];  Service: Gastroenterology;;  BIOPSY  12/10/2018   Procedure: BIOPSY;  Surgeon: Rush Landmark Telford Nab., MD;  Location: [place];  Service: Gastroenterology;;   CARDIAC CATHETERIZATION     CHOLECYSTECTOMY     COLONOSCOPY     ENDOSCOPIC RETROGRADE CHOLANGIOPANCREATOGRAPHY (ERCP) WITH PROPOFOL N/A 12/10/2018   Procedure: ENDOSCOPIC RETROGRADE CHOLANGIOPANCREATOGRAPHY (ERCP) WITH PROPOFOL;  Surgeon: Irving Copas., MD;  Location: [place];  Service: Gastroenterology;  Laterality: N/A;  NEED  2 1/2 HOUR  FOR CASE-JILL/PATTY   ESOPHAGOGASTRODUODENOSCOPY (EGD) WITH PROPOFOL N/A 12/10/2018   Procedure: ESOPHAGOGASTRODUODENOSCOPY (EGD) WITH PROPOFOL;  Surgeon: Rush Landmark Telford Nab., MD;  Location: [place];  Service: Gastroenterology;  Laterality: N/A;   FRACTURE SURGERY     Left plates and screws   pain stimulator   2012   pain stimulator   2012   removed 2012   PARTIAL KNEE ARTHROPLASTY Right 04/17/2017   Procedure: UNICOMPARTMENTAL KNEE;  Surgeon: Vickey Huger, MD;  Location: [place];  Service: Orthopedics;  Laterality: Right;   REMOVAL OF STONES  12/10/2018   Procedure: REMOVAL OF STONES;  Surgeon: Rush Landmark Telford Nab., MD;  Location: [place];  Service: Gastroenterology;;   REPLACEMENT UNICONDYLAR JOINT KNEE Right 04/17/2017   SPHINCTEROTOMY  12/10/2018   Procedure: Joan Mayans;  Surgeon: Mansouraty, Telford Nab., MD;  Location: [place];  Service: Gastroenterology;;   Bess Kinds CHOLANGIOSCOPY N/A 12/10/2018   Procedure: TKZSWFUX CHOLANGIOSCOPY;  Surgeon: Irving Copas., MD;  Location: [place];  Service: Gastroenterology;  Laterality: N/A;   TONSILLECTOMY     UVULOPALATOPHARYNGOPLASTY (UPPP)/TONSILLECTOMY/SEPTOPLASTY     VASECTOMY      Social History:  reports that he quit smoking about 57 years ago. His smoking use included cigarettes. He has never used smokeless tobacco. He reports that he does not drink alcohol and does not use drugs.  Allergies  Allergen Reactions   Phenergan [Promethazine] Other (See Comments)    Cramps, shakes.   Augmentin [Amoxicillin-Pot Clavulanate] Other (See Comments)    UNKNOWN [SEE BELOW]  Has patient had a PCN reaction causing immediate rash, facial/tongue/throat swelling, SOB or lightheadedness with hypotension: Unknown Has patient had a PCN reaction causing severe rash involving mucus membranes or skin necrosis: Uknown PATIENT HAS HAD A PCN REACTION THAT REQUIRED HOSPITALIZATION: >> UNSPECIFIED REACTION WHILE HE WAS ALREADY  ADMITTED TO A HOSPITAL.  Has patient had a PCN reaction occurring within the last 10 years: No    Oxycodone Other (See Comments)    UNSPECIFIED REACTION  TOLERATES APAP WITHOUT OXYCODONE   Percocet [Oxycodone-Acetaminophen] Other (See Comments)    Unknown. Tolerates acetaminophen alone.    Family History  Problem Relation Age of Onset   Heart disease Mother    Heart disease Father    Breast cancer Sister    Heart disease Brother    Colon cancer Neg Hx    Esophageal cancer Neg Hx    Inflammatory bowel disease Neg Hx    Liver disease Neg Hx    Pancreatic cancer Neg Hx    Rectal cancer Neg Hx    Stomach cancer Neg Hx      Prior to Admission medications   Medication Sig Start Date End Date Taking? Authorizing Provider  aspirin EC 81 MG tablet Take 81 mg by mouth daily. Swallow whole.    [provider]  CALCIUM CITRATE PO Take 1-2 tablets by mouth daily.    [provider]  celecoxib (CELEBREX) 200 MG capsule Take 200 mg by mouth every evening. 03/20/17   [provider]  Cholecalciferol (VITAMIN D3  PO) Take 1,500 Units by mouth every morning.     [provider]  clarithromycin (BIAXIN) 250 MG tablet Take 250 mg by mouth 2 (two) times daily. Prior to dental work    [provider]  FERROUS SULFATE PO Take 270 mg by mouth daily.    [provider]  FISH OIL-VITAMIN D PO Take 1,400 mg by mouth 2 (two) times a day.    [provider]  fluticasone (FLONASE) 50 MCG/ACT nasal spray Place into both nostrils daily. prn    [provider]  Fluvoxamine Maleate 100 MG CP24 Take 1 capsule by mouth once.    [provider]  Glucosamine-Chondroit-Vit C-Mn (GLUCOSAMINE 1500 COMPLEX PO) Take 1 capsule by mouth 2 (two) times daily.    [provider]  HYDROcodone-acetaminophen (NORCO/VICODIN) 5-325 MG tablet Take 1 tablet by mouth every 4 (four) hours as needed. Patient taking differently: Take 1 tablet by  mouth 2 (two) times daily. 04/10/19   Evalee Jefferson, PA-C  Multiple Vitamin (MULTIVITAMIN WITH MINERALS) TABS tablet Take 1 tablet by mouth daily.    [provider]  Multiple Vitamins-Minerals (PRESERVISION AREDS PO) Take 1 tablet by mouth 2 (two) times daily.    [provider]  omeprazole (PRILOSEC) 20 MG capsule TAKE 1 CAPSULE (20 MG TOTAL) BY MOUTH 2 (TWO) TIMES DAILY BEFORE A MEAL. 08/04/21   Mansouraty, Telford Nab., MD  OXYGEN Inhale 2 L into the lungs at bedtime.    [provider]  PHENobarbital (LUMINAL) 64.8 MG tablet Take 60 mg by mouth daily after breakfast. 02/27/17   [provider]  polycarbophil (FIBERCON) 625 MG tablet Take 1,400 mg by mouth 2 (two) times daily.     [provider]  polyethylene glycol (MIRALAX / GLYCOLAX) 17 g packet Take 17 g by mouth daily. 1/2 capful daily    [provider]  rOPINIRole (REQUIP) 0.25 MG tablet Take 3 mg by mouth 3 (three) times daily.    [provider]  simvastatin (ZOCOR) 40 MG tablet Take 40 mg by mouth at bedtime. for cholesterol 02/25/17   [provider]  SUPER B COMPLEX/C PO Take 1 tablet by mouth every morning.     [provider]    Physical Exam: Vitals:   03/13/22 1726 03/13/22 1815 03/13/22 2100 03/14/22 0013  BP:  123/88 125/89 (!) 156/91  Pulse:  75 72 67  Resp:  '16 18 16  '$ Temp:    98.2 F (36.8 C)  TempSrc:    Oral  SpO2: 95% 98% 99% 96%   Constitutional: Resting in bed, NAD, calm, comfortable Eyes:  lids and conjunctivae normal ENMT: Mucous membranes are moist. Posterior pharynx clear of any exudate or lesions.Normal dentition.  Neck: normal, supple, no masses. Respiratory: clear to auscultation bilaterally, no wheezing, no crackles. Normal respiratory effort. No accessory muscle use.  Cardiovascular: Regular rate and rhythm, no murmurs / rubs / gallops.  Abdomen: no tenderness, no masses palpated.  Musculoskeletal: Swelling and hematoma to  right knee, no open wound Skin: Bruising right knee Neurologic: Sensation intact. Strength equal bilaterally Psychiatric: Alert and oriented x 3.   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  He is wheelchair dependent but has been falling when he tries to get up.  He has a walker at home but unclear if he is using  it consistently when he is trying to ambulate.  He has swelling to the right knee with likely hematoma secondary to injury from fall.  Right knee x-ray negative for acute fracture or dislocation.  Prior arthroplasty components intact and in anatomic alignment.  CT head negative for acute findings.  CT chest/abdomen/pelvis negative for acute traumatic injuries.  Labs are reassuring.  Currently there is no medical necessity for admission.  Recommend PT/OT evaluation.  Continue home [place] for pain.  Zada Finders MD [facility]  If 7PM-7AM, please contact night-coverage [email or web address]  03/14/2022, 12:48 AM

## 2022-03-14 NOTE — Progress Notes (Signed)
CSW spoke with patient about the recommendations for SNF placement. Patient stated he is not going to SNF. CSW told patient she would call his wife. Patient stated his wife will say he needs to go to SNF but he is not going. Patient stated he would like to go home and he has people who can care for him there. Patient also stated he was tired of being in the bed in the hallway and also wanted to be placed into a wheelchair.   CSW contacted patients wife who stated patient needs to go to SNF. CSW explained she cannot force him to go and if he refuses to go or participate the facilities will not take him. CSW also explained that Medicare only covers short term rehab and most people stay for about 2 weeks and discharge home. CSW asked patients wife what is her plan after rehab. Patients wife stated she did not have a plan and stated she cannot care for him. CSW asked the wife if patient has medicaid because then she can consider long term placement. The wife stated they make too much money for medicaid and she is also buying a house. CSW stated she could pay out of pocket as well and patients wife stated she did not have the money for that. CSW stated that she could hire a caregiver to come into the home as well and help her. CSW stated a hospital bed and a hoyer lift could also be ordered if her husband gets approved for those items. CSW told patients wife she could also do the referral for SNF placement but if patient is not going to participate then insurance will not pay for it. Patients wife stated she guess she will have to bring him home. CSW told patients wife she would call her back with more information about the hospital bed and hoyer lift. Patients wife stated she has space for the bed in the living room.

## 2022-03-14 NOTE — ED Notes (Signed)
Placed a condom cath

## 2022-03-14 NOTE — ED Provider Notes (Signed)
I was informed by [facility] that patient is likely to go home tomorrow.  Unfortunately, wife was not ready to have medical equipment delivered and she cannot support him without these.  Hopefully this will be delivered tomorrow and he will be discharged. Will have to continue to board for now   Sherwood Gambler, MD 03/14/22 1702

## 2022-03-15 MED ORDER — GLYCERIN (LAXATIVE) 2 G RE SUPP: 1.0000 | Freq: Once | RECTAL | Status: DC

## 2022-03-15 MED ORDER — FLEET ENEMA 7-19 GM/118ML RE ENEM: 1.0000 | ENEMA | Freq: Once | RECTAL | Status: DC

## 2022-03-15 MED ORDER — POLYETHYLENE GLYCOL 3350 17 G PO PACK
17.0000 g | PACK | Freq: Every day | ORAL | Status: DC
  Administered 2022-03-15: 17 g via ORAL
  Filled 2022-03-15: qty 1

## 2022-03-15 MED ORDER — CELECOXIB 200 MG PO CAPS
200.0000 mg | ORAL_CAPSULE | Freq: Every evening | ORAL | 0 refills | Status: AC
Start: 2022-03-15 — End: 2022-04-14

## 2022-03-15 NOTE — ED Notes (Signed)
RN called [facility] to see if equipment had been dropped off and they confirmed it is still pending delivery.

## 2022-03-15 NOTE — Discharge Instructions (Signed)
[facility] [employer] ? ?[facility] ?Address: [address], [place], [place] [address] ?Phone: [phone] ? ?[facility] ?Address: [address], [place], [place] [address] ?Phone: [phone] ? ?[facility] ?Address: [address], [place], [place] [address] ?Phone: [phone] ? ?[facility] ?Address: [address], [place], [place] [address] ?Phone: [phone] ? ?[facility] ?Address: [address], [place], [place] [address] ?Phone: [phone] ? ?[facility] ?Address:  [address], [place], [place] [address] ?Phone:  [phone] ? ?[facility] ?Address:  [address] ?Phone:  [phone] ? ?[email or web address] ? ?[facility] ?[phone] ?  ?

## 2022-03-15 NOTE — ED Notes (Signed)
[facility] called; 15th in line.

## 2022-03-15 NOTE — Progress Notes (Signed)
CSW received a call from patients wife stating that [facility] is at the house now delivering the hospital bed and hoyer lift. Patients wife has questions about pain medication. ED provider spoke with patients wife this morning and told her what would be prescribed. CSW also notified patients nurse to let her know [facility] can now be contacted.

## 2022-03-15 NOTE — ED Notes (Signed)
RN called wife to see if equipment had been dropped off. She states it had not.

## 2022-03-16 NOTE — ED Notes (Signed)
Patient belongings returned to patient and sent home with [facility]

## 2022-03-16 NOTE — ED Notes (Signed)
[facility] called, state patient is next in line

## 2022-03-18 ENCOUNTER — Encounter: Payer: Self-pay | Admitting: Gastroenterology

## 2022-03-18 NOTE — Progress Notes (Signed)
Review of outside barium swallow imaging results  May 2023 barium esophagram Markedly tortuous esophagus with multiple tertiary contractions, suggestive of dysmotility. Suspected region of persistent narrowing just above the GE junction may be reflective of a stricture. Moderate hiatal hernia. No evidence of aspiration. Limited evaluation for reflux.   Reviewed patient's previous ERCP which showed grade C esophagitis.  There is certainly the risk of the patient having developed a stricture or narrowing and with his underlying other issues, he is a higher risk anesthesia candidate (home O2) but could certainly be considered for EGD.  We will try to schedule the patient a follow-up in clinic with he and his wife versus a virtual visit and decide based on symptoms if we need to move forward with a hospital-based endoscopy and possible dilation.   Patty, Please reach out to the patient's wife and see if they want to follow-up to discuss this further otherwise we will just have it scanned to the chart.  [name], MD [facility] Office # [phone]

## 2022-03-18 NOTE — Progress Notes (Signed)
Left message on machine to call back  

## 2022-03-21 NOTE — Progress Notes (Signed)
I have been unable to reach the pt by phone.  Letter mailed to have the pt return call to discuss if interested.

## 2022-05-31 ENCOUNTER — Ambulatory Visit: Payer: Medicare Other | Admitting: Gastroenterology

## 2022-06-03 ENCOUNTER — Encounter: Payer: Self-pay | Admitting: *Deleted

## 2022-06-10 ENCOUNTER — Ambulatory Visit: Payer: Medicare Other | Admitting: Gastroenterology

## 2022-06-10 ENCOUNTER — Telehealth: Payer: Self-pay | Admitting: Gastroenterology

## 2022-06-10 VITALS — Wt 157.2 lb

## 2022-06-10 DIAGNOSIS — R630 Anorexia: Secondary | ICD-10-CM

## 2022-06-10 DIAGNOSIS — R1312 Dysphagia, oropharyngeal phase: Secondary | ICD-10-CM

## 2022-06-10 DIAGNOSIS — C249 Malignant neoplasm of biliary tract, unspecified: Secondary | ICD-10-CM

## 2022-06-10 DIAGNOSIS — R21 Rash and other nonspecific skin eruption: Secondary | ICD-10-CM

## 2022-06-10 DIAGNOSIS — K5909 Other constipation: Secondary | ICD-10-CM

## 2022-06-10 DIAGNOSIS — R634 Abnormal weight loss: Secondary | ICD-10-CM

## 2022-06-10 DIAGNOSIS — K5900 Constipation, unspecified: Secondary | ICD-10-CM

## 2022-06-10 DIAGNOSIS — R933 Abnormal findings on diagnostic imaging of other parts of digestive tract: Secondary | ICD-10-CM

## 2022-06-10 DIAGNOSIS — K5641 Fecal impaction: Secondary | ICD-10-CM

## 2022-06-10 DIAGNOSIS — F0393 Unspecified dementia, unspecified severity, with mood disturbance: Secondary | ICD-10-CM

## 2022-06-10 NOTE — Telephone Encounter (Signed)
This needs to go to the [place] with Dr Rush Landmark. I will forward

## 2022-06-10 NOTE — Telephone Encounter (Signed)
Patients wife calling stating they might be a few minutes late. He is constipated and keeps running to bathroom.

## 2022-06-10 NOTE — Patient Instructions (Signed)
Your provider has requested that you go to the basement level for lab work before leaving today. Press "B" on the elevator. The lab is located at the first door on the left as you exit the elevator.  Increase miralax to 17 grams (1 capful) twice daily.  Tale dulcolax 10 mg this evening if no bowel movement.  If you do not have a bowel movement after 10 mg dulcolax, follow this with dulcolax 10 mg again as well as an over the counter glycerin suppository or enema.  Increase your fluid intake to 64-80 ounces daily. Remember, water helps those bowels move!  _______________________________________________________  If you are age 82 or older, your body mass index should be between 23-30. Your Body mass index is 24.62 kg/m. If this is out of the aforementioned range listed, please consider follow up with your Primary Care Provider.  If you are age 25 or younger, your body mass index should be between 19-25. Your Body mass index is 24.62 kg/m. If this is out of the aformentioned range listed, please consider follow up with your Primary Care Provider.   ________________________________________________________  The [facility] providers would like to encourage you to use [facility] to communicate with providers for non-urgent requests or questions.  Due to long hold times on the telephone, sending your provider a message by [facility] may be a faster and more efficient way to get a response.  Please allow 48 business hours for a response.  Please remember that this is for non-urgent requests.  _______________________________________________________  Due to recent changes in healthcare laws, you may see the results of your imaging and laboratory studies on MyChart before your provider has had a chance to review them.  We understand that in some cases there may be results that are confusing or concerning to you. Not all laboratory results come back in the same time frame and the provider may be waiting for  multiple results in order to interpret others.  Please give [place] 48 hours in order for your provider to thoroughly review all the results before contacting the office for clarification of your results.

## 2022-06-11 ENCOUNTER — Encounter: Payer: Self-pay | Admitting: Gastroenterology

## 2022-06-11 DIAGNOSIS — R634 Abnormal weight loss: Secondary | ICD-10-CM | POA: Insufficient documentation

## 2022-06-11 DIAGNOSIS — R933 Abnormal findings on diagnostic imaging of other parts of digestive tract: Secondary | ICD-10-CM | POA: Insufficient documentation

## 2022-06-11 DIAGNOSIS — F0393 Unspecified dementia, unspecified severity, with mood disturbance: Secondary | ICD-10-CM | POA: Insufficient documentation

## 2022-06-11 DIAGNOSIS — R630 Anorexia: Secondary | ICD-10-CM | POA: Insufficient documentation

## 2022-06-11 DIAGNOSIS — R21 Rash and other nonspecific skin eruption: Secondary | ICD-10-CM | POA: Insufficient documentation

## 2022-06-11 DIAGNOSIS — R1312 Dysphagia, oropharyngeal phase: Secondary | ICD-10-CM | POA: Insufficient documentation

## 2022-06-11 DIAGNOSIS — K5641 Fecal impaction: Secondary | ICD-10-CM | POA: Insufficient documentation

## 2022-06-11 DIAGNOSIS — K5909 Other constipation: Secondary | ICD-10-CM | POA: Insufficient documentation

## 2022-06-11 NOTE — Progress Notes (Signed)
GASTROENTEROLOGY OUTPATIENT CLINIC VISIT   Primary Care Provider Elyn Aquas [address] [place] [address] [phone]  Patient Profile: Samuel Olson is a 81 y.o. 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  The patient presents to the [facility] for an evaluation and management of problem(s) noted below:  Problem List 1. Other constipation   2. Fecal impaction ([place])   3. Unintentional weight loss   4. Oropharyngeal dysphagia   5. Abnormal barium swallow   6. Skin rash   7. 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 Anorexia   9. Dementia with mood disturbance, unspecified dementia severity, unspecified dementia type ([place])    History of Present Illness Please see prior notes for full details of HPI.  Interval History The patient returns with his wife for a scheduled follow-up.  Patient has had progressive memory issues per report of his wife and is being diagnosed with dementia.  He follows with a neurologist.  He was recently started on Depakote for mood swings.  He has been dealing with more significant issues of constipation than normal.  He is gone nearly 4 days without a bowel movement per the report.  He drinks less than 36 ounces of fluid per day per discussion with the family.  They are trying MiraLAX once daily.  In the past MiraLAX once daily was more than effective enough where they actually got down to no more than 1/4-1/2 cap per day.  However now with 1 capful daily they are not seeing much benefit.  Patient continues to take FiberCon daily.  Patient was on the toilet for nearly 3 hours earlier today without a bowel movement.  He is having a difficulty with food bolus and  pill transit from his mouth into his esophagus.  He underwent a recent barium swallow with notation in a documentation note in his chart that showed concern for potential smooth stricture or ring distally.  He is describing no overt dysphagia symptoms.  His wife agrees with this.  He is having issues of falling asleep while eating but this is also occurring while he rests in any position.  He is experiencing unintentional weight loss.  He also has a rash in his pannus which is felt to be heat related versus fungal.  The patient's granddaughter who works in healthcare, has been placing a powder to try and keep this area clean.  He recently had foot surgery and is still in a boot on his left leg.   GI Review of Systems Positive as above  Negative for odynophagia, nausea, vomiting, abdominal pain, melena, hematochezia   Review of Systems General: Denies fevers/chills Cardiovascular: Denies chest pain Pulmonary: Denies shortness of breath Gastroenterological: See HPI Genitourinary: Denies darkened urine Hematological: Denies easy bruising/bleeding Dermatological: Denies jaundice Psychological: Mood is stable   Medications Current Outpatient Medications  Medication Sig Dispense Refill   aspirin EC 81 MG tablet Take 81 mg by mouth daily. Swallow whole.     CALCIUM CITRATE PO Take 1 tablet by mouth daily.     Cholecalciferol (VITAMIN D3) 125 MCG (5000 UT) TABS Take 5,000 Units by mouth every morning.     ferrous sulfate 325 (65 FE) MG tablet Take 325 mg by mouth daily.     FISH OIL-VITAMIN D PO Take 1,400  mg by mouth 2 (two) times a day.     fluticasone (FLONASE) 50 MCG/ACT nasal spray Place 1 spray into both nostrils daily as needed for allergies.     Glucosamine-Chondroit-Vit C-Mn (GLUCOSAMINE 1500 COMPLEX PO) Take 1 capsule by mouth 2 (two) times daily.     HYDROcodone-acetaminophen (NORCO/VICODIN) 5-325 MG tablet Take 1 tablet by mouth every 4 (four) hours as needed. (Patient taking  differently: Take 1 tablet by mouth 2 (two) times daily as needed for moderate pain.) 15 tablet 0   Multiple Vitamin (MULTIVITAMIN WITH MINERALS) TABS tablet Take 1 tablet by mouth daily.     Multiple Vitamins-Minerals (PRESERVISION AREDS PO) Take 1 tablet by mouth 2 (two) times daily.     omeprazole (PRILOSEC) 20 MG capsule TAKE 1 CAPSULE (20 MG TOTAL) BY MOUTH 2 (TWO) TIMES DAILY BEFORE A MEAL. 180 capsule 1   OXYGEN Inhale 2 L into the lungs at bedtime.     PHENObarbital (LUMINAL) 60 MG tablet Take 60 mg by mouth at bedtime.     polycarbophil (FIBERCON) 625 MG tablet Take 1,400 mg by mouth 2 (two) times daily.      polyethylene glycol (MIRALAX / GLYCOLAX) 17 g packet Take 17 g by mouth daily as needed for mild constipation.     pramipexole (MIRAPEX) 1 MG tablet Take 1 mg by mouth 3 (three) times daily.     rOPINIRole (REQUIP) 2 MG tablet Take 2 mg by mouth 3 (three) times daily.     simvastatin (ZOCOR) 40 MG tablet Take 40 mg by mouth at bedtime. for cholesterol  3   No current facility-administered medications for this visit.    Allergies Allergies  Allergen Reactions   Phenergan [Promethazine] Other (See Comments)    Cramps, shakes.   Augmentin [Amoxicillin-Pot Clavulanate] Other (See Comments)    UNKNOWN [SEE BELOW]  Has patient had a PCN reaction causing immediate rash, facial/tongue/throat swelling, SOB or lightheadedness with hypotension: Unknown Has patient had a PCN reaction causing severe rash involving mucus membranes or skin necrosis: Uknown PATIENT HAS HAD A PCN REACTION THAT REQUIRED HOSPITALIZATION: >> UNSPECIFIED REACTION WHILE HE WAS ALREADY ADMITTED TO A HOSPITAL.  Has patient had a PCN reaction occurring within the last 10 years: No    Oxycodone Other (See Comments)    UNSPECIFIED REACTION  TOLERATES APAP WITHOUT OXYCODONE   Percocet [Oxycodone-Acetaminophen] Other (See Comments)    Unknown. Tolerates acetaminophen alone.    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   Sleep apnea    Uses O2 at 2L while sleeping; "can't tolerate CPAP OR BIPAP" * (04/17/2017)   Vertigo    Wears glasses    Past Surgical History:  Procedure Laterality Date   BACK SURGERY     BILIARY STENT PLACEMENT  12/10/2018   Procedure: BILIARY STENT PLACEMENT;  Surgeon: Irving Copas., MD;  Location: [facility];  Service: Gastroenterology;;   BIOPSY  12/10/2018   Procedure: BIOPSY;  Surgeon: Irving Copas., MD;  Location: [place];  Service: Gastroenterology;;   CARDIAC CATHETERIZATION     CHOLECYSTECTOMY     COLONOSCOPY     ENDOSCOPIC RETROGRADE CHOLANGIOPANCREATOGRAPHY (ERCP) WITH PROPOFOL N/A 12/10/2018   Procedure: ENDOSCOPIC RETROGRADE CHOLANGIOPANCREATOGRAPHY (ERCP) WITH PROPOFOL;  Surgeon: Irving Copas., MD;  Location: [place];  Service: Gastroenterology;  Laterality: N/A;  NEED  2 1/2 HOUR FOR CASE-JILL/PATTY   ESOPHAGOGASTRODUODENOSCOPY (EGD) WITH PROPOFOL N/A 12/10/2018   Procedure: ESOPHAGOGASTRODUODENOSCOPY (EGD) WITH PROPOFOL;  Surgeon: Rush Landmark Telford Nab., MD;  Location: [place];  Service: Gastroenterology;  Laterality: N/A;   FRACTURE SURGERY     Left plates and screws   OSTECTOMY METATARSAL Left    5th   pain stimulator   2012   pain stimulator   2012   removed 2012   PARTIAL KNEE ARTHROPLASTY Right 04/17/2017   Procedure: UNICOMPARTMENTAL KNEE;  Surgeon: Vickey Huger, MD;  Location: [facility];  Service: Orthopedics;  Laterality:  Right;   REMOVAL OF STONES  12/10/2018   Procedure: REMOVAL OF STONES;  Surgeon: Rush Landmark Telford Nab., MD;  Location: [place];  Service: Gastroenterology;;   REPLACEMENT UNICONDYLAR JOINT KNEE Right 04/17/2017   SPHINCTEROTOMY  12/10/2018   Procedure: Joan Mayans;  Surgeon: Mansouraty, Telford Nab., MD;  Location: [place];  Service: Gastroenterology;;   Bess Kinds CHOLANGIOSCOPY N/A 12/10/2018   Procedure: EPPIRJJO CHOLANGIOSCOPY;  Surgeon: Irving Copas., MD;  Location: [place];  Service: Gastroenterology;  Laterality: N/A;   TONSILLECTOMY     UVULOPALATOPHARYNGOPLASTY (UPPP)/TONSILLECTOMY/SEPTOPLASTY     VASECTOMY     Social History   Socioeconomic History   Marital status: Married    Spouse name: Not on file   Number of children: 3   Years of education: Not on file   Highest education level: Not on file  Occupational History   Occupation: retired  Tobacco Use   Smoking status: Former    Types: Cigarettes    Quit date: 1966    Years since quitting: 57.7   Smokeless tobacco: Never   Tobacco comments:    quit smoking cigarettes in 1966  Vaping Use   Vaping Use: Never used  Substance and Sexual Activity   Alcohol use: No   Drug use: No   Sexual activity: Not on file  Other Topics Concern   Not on file  Social History Narrative   Not on file   Social Determinants of Health   Financial Resource Strain: Not on file  Food Insecurity: Not on file  Transportation Needs: Not on file  Physical Activity: Not on file  Stress: Not on file  Social Connections: Not on file  Intimate Partner Violence: Not on file   Family History  Problem Relation Age of Onset   Heart disease Mother    Heart disease Father    Breast cancer Sister    Heart disease Brother    Colon cancer Neg Hx    Esophageal cancer Neg Hx    Inflammatory bowel disease Neg Hx    Liver disease Neg Hx    Pancreatic cancer Neg Hx    Rectal cancer Neg Hx    Stomach cancer Neg Hx     I have reviewed his medical, social, and family history in detail and updated the electronic medical record as necessary.    PHYSICAL EXAMINATION  Wt 157 lb 3.2 oz (71.3 kg)   BMI 24.62 kg/m  Wt Readings from Last 3 Encounters:  06/10/22 157 lb 3.2 oz (71.3 kg)  12/08/21 163 lb 9.6 oz (74.2 kg)  11/13/21 165 lb (74.8 kg)  GEN: Elderly, looks older and less healthy than our last visit, accompanied by wife PSYCH: Cooperative, without pressured speech EYE: Conjunctivae pink, sclerae anicteric ENT: MMM CV: Nontachycardic RESP: No audible wheezing GI: NABS, soft, mildly protuberant abdomen, he has a pinkish tinge in the area of pannus due to his positioning of his seating posture, otherwise well-healed surgical scar, no rebound or guarding  GU: External hemorrhoids present, internal hemorrhoids palpable, large stool burden in rectum with greater than 5 minutes worth of fecal disimpaction MSK/EXT: Much more significant back posturing than I have seen him in the past (using a walker), left lower extremity in boot, mild edema in the right leg 1/2+ SKIN: No jaundice NEURO:  Alert & Oriented x 3   REVIEW OF DATA  I reviewed the following data at the time of this encounter:  GI Procedures and Studies  No new relevant studies to review  Laboratory Studies  Reviewed those in epic  Imaging Studies  June 2023 CT chest abdomen pelvis IMPRESSION: Chest Impression: 1. No clear evidence acute thoracic trauma. 2. Bilateral chronic and subacute rib fractures. 3. 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 Favor chronic infectious or inflammatory process. As no recent comparison available, recommend follow-up CT in 3 to 6 months. Abdomen / Pelvis Impression: 1. Increase in diameter of external bile duct and pancreatic duct. Prior bile duct stent was in place. Recommend correlation with surgical/oncological history. Favor benign  postsurgical change. 2. No evidence of acute trauma in the abdomen pelvis. 3. Evidence of remote RIGHT transverse process fractures. Posterior lumbar fusion.  May 2023 outside barium swallow Markedly tortuous esophagus with multiple tertiary contractions, suggestive of dysmotility. Suspected region of persistent narrowing just above the GE junction may be reflective of a stricture. Moderate hiatal hernia. No evidence of aspiration. Limited evaluation for reflux.   ASSESSMENT  Mr. Fernandez is a 81 y.o. 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  The patient is seen today for evaluation and management of:  1. Other constipation   2. Fecal impaction ([place])   3. Unintentional weight loss   4. Oropharyngeal dysphagia   5. Abnormal barium swallow   6. Skin rash   7. 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 Anorexia   9. Dementia with mood disturbance, unspecified dementia severity, unspecified dementia type [facility])    The patient is hemodynamically stable.  The patient's major GI issue at this point in time is his significant constipation.  I have performed a fecal disimpaction but there still remains stool in the rectal vault.  He is going to increase his bowel regimen as outlined below and consider a suppository or enema over the weekend to try to help stimulate and move things along.  He is not drinking enough fluids and I think this is contributing with the rest of his medications on board as well.  Hopefully he will not require further emergency department disimpaction under anesthesia but time will tell.  In regards to his progressive unintentional weight loss, it is not clear to me that this is a result of the reported esophageal  narrowing on recent outside esophagram.  He describes no true dysphagia symptoms but is actually having oral pharyngeal bolus transit issues.  However the patient's wife does not believe he will follow through with any exercises and so an SLP evaluation which would be helpful, has been deferred at this time.  We can certainly reconsider this in the future.  We can also consider a repeat endoscopy to evaluate the esophagus but again I am not clear that we need to put him through that type of procedure now based on his current symptoms.  He did not undergo MRI abdomen this year but had a CT chest/abdomen/pelvis which did not show any overt abnormalities.  We will hold on that for now.  We may consider a CA 19-9 to be obtained at follow-up.  He will need to continue to work with his primary care provider and his neurologist in regards to his dementia symptoms.  All patient questions were answered to the best of my ability, and the patient agrees to the aforementioned plan of action with follow-up as indicated.   PLAN  If any GERD symptoms or true dysphagia symptoms redevelop then consider restart PPI When patient follows up we will consider updated MRI/MRCP by December 2023 or January 2024 (counting the recent CT chest/abdomen/pelvis as imaging for this year) Consider sending CA 19-9 Consider SLP evaluation in future Holding on EGD as true esophageal dysphagia symptoms seem not to be present Drink at least 64 to 80 ounces of water/liquid per day Increase MiraLAX to once to twice daily if not 3 times daily as needed Tonight if no bowel movement then glycerin suppository over-the-counter or enema If no bowel movement over the weekend then repeat enema and oral Dulcolax Stronger laxatives can be considered as samples to the patient if necessary Obtain H. pylori stool antigen when able to further assess his anorexia May continue current skin powder or barrier cream to the pannus twice daily Clean the area  of pannus rash with soap and water daily to twice daily before powder or barrier cream placement   Orders Placed This Encounter  Procedures   Helicobacter pylori special antigen     New Prescriptions   No medications on file   Modified Medications   No medications on file    Planned Follow Up No follow-ups on file.   Total Time in Face-to-Face and in Coordination of Care for patient including independent/personal interpretation/review of prior testing, medical history, examination, medication adjustment, communicating results with the patient directly, and documentation with the EHR is 45 minutes.   [name], MD [facility] Office # [phone]

## 2022-06-15 ENCOUNTER — Telehealth: Payer: Self-pay | Admitting: Gastroenterology

## 2022-06-15 NOTE — Telephone Encounter (Signed)
Inbound call from wife states patient is constipated and is worried.

## 2022-06-16 NOTE — Telephone Encounter (Signed)
Line rings no answer no voice mail   See message below with recommendations from last office visit.  "Drink at least 64 to 80 ounces of water/liquid per day Increase MiraLAX to once to twice daily if not 3 times daily as needed Tonight if no bowel movement then glycerin suppository over-the-counter or enema If no bowel movement over the weekend then repeat enema and oral Dulcolax Stronger laxatives can be considered as samples to the patient if necessary"

## 2022-06-16 NOTE — Telephone Encounter (Signed)
I spoke with the pt's wife and she states that today the pt had a good BM after increasing miralax to 3 times daily and taking dulcolax.  She also used an enema last night.  She will continue the miralax, dulcolax and enema as needed and call back if he does not continue to move his bowels.

## 2022-07-09 IMAGING — CT CT CHEST-ABD-PELV W/ CM
2 of 6 series · 13 of 36 positions shown, 15 images · IV contrast (Omni 300)
Comparison: CT 12/18/2018

CLINICAL DATA: Poly trauma.  Multiple falls yesterday

EXAM:
CT CHEST, ABDOMEN, AND PELVIS WITH CONTRAST
TECHNIQUE: Multidetector CT imaging of the chest, abdomen and pelvis was
performed following the standard protocol during bolus
administration of intravenous contrast.

[Series 5: cap 1.0 (person_name) · axial · 0.89mm/px · z∈[+995,+1530]mm · 10 of 603 slices shown, 12 images]
[im 34/603  mediastinal]
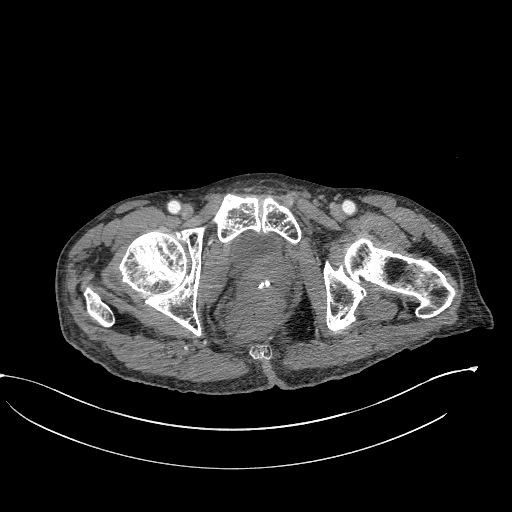
[im 34/603  bone]
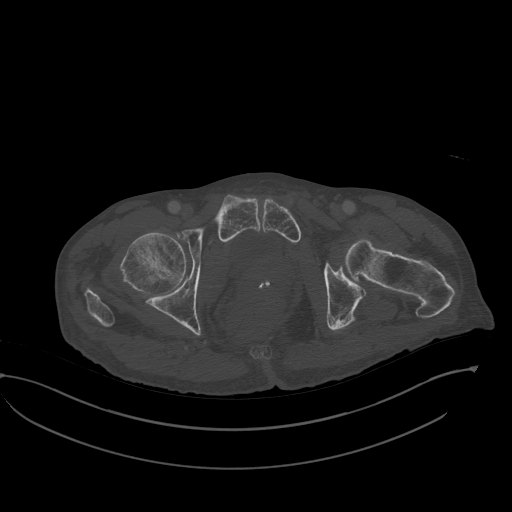
[im 101/603  mediastinal]
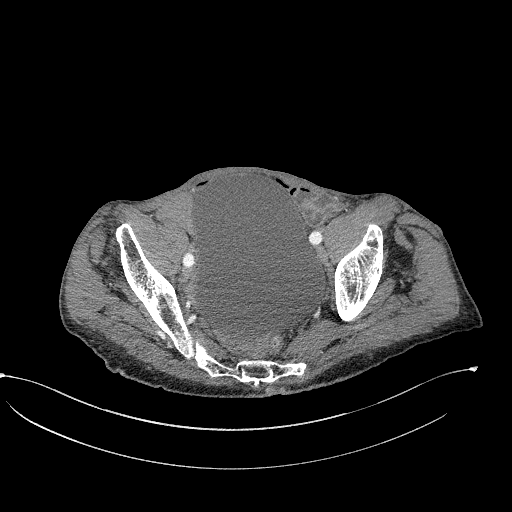
[im 168/603  mediastinal]
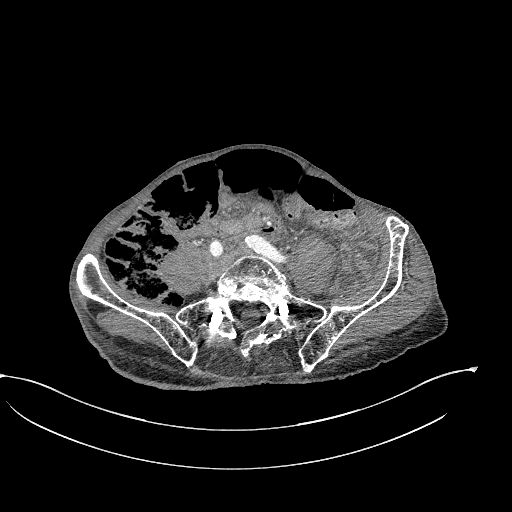
[im 201/603  mediastinal]
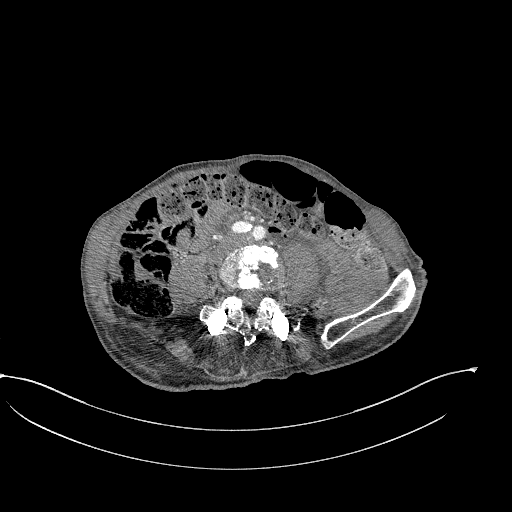
[im 268/603  mediastinal]
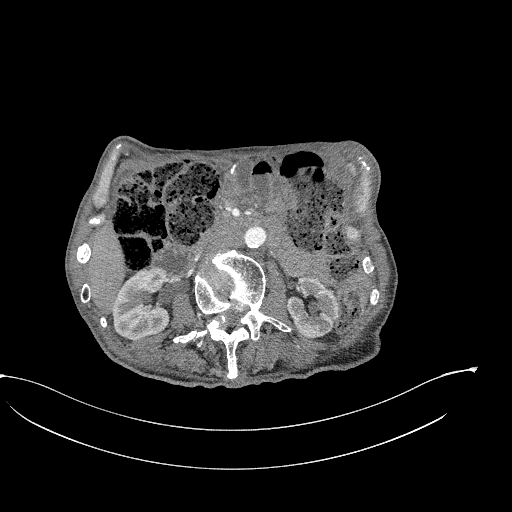
[im 335/603  mediastinal]
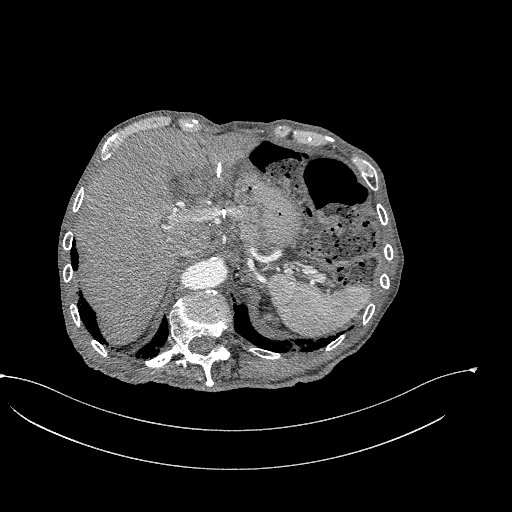
[im 402/603  mediastinal]
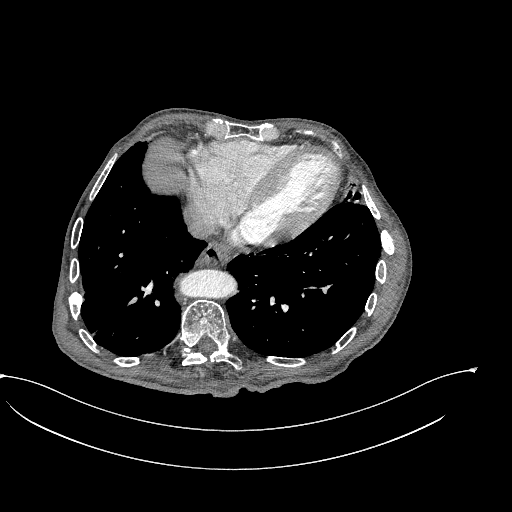
[im 435/603  mediastinal]
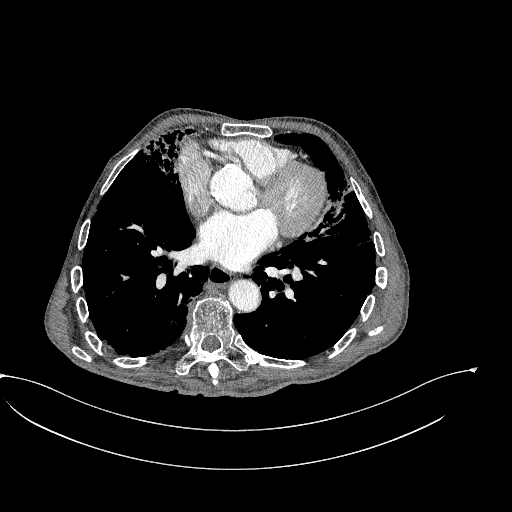
[im 502/603  mediastinal]
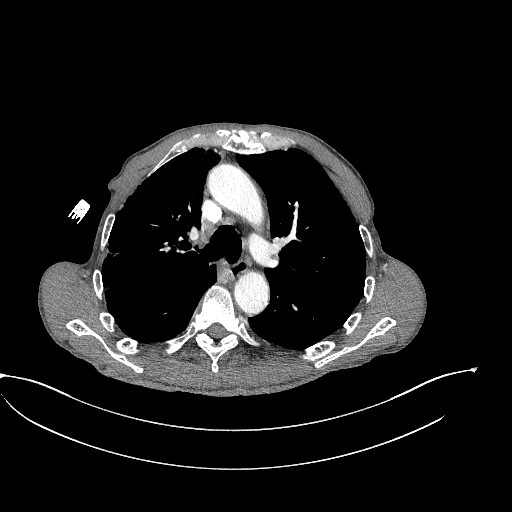
[im 502/603  bone]
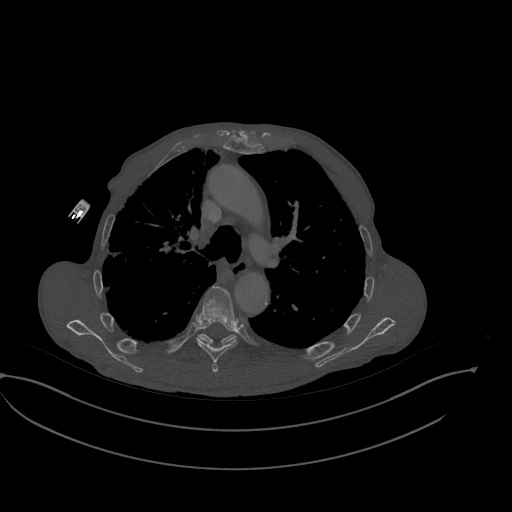
[im 569/603  mediastinal]
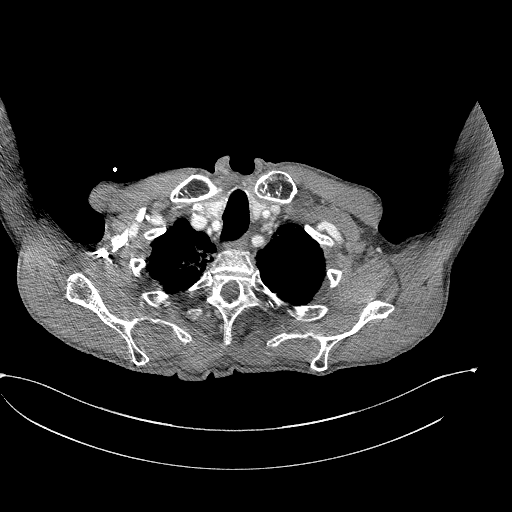

[Series 6: cap with 3mm st cor · coronal · 0.72mm/px · 3 of 146 slices shown]
[im 30/146  mediastinal]
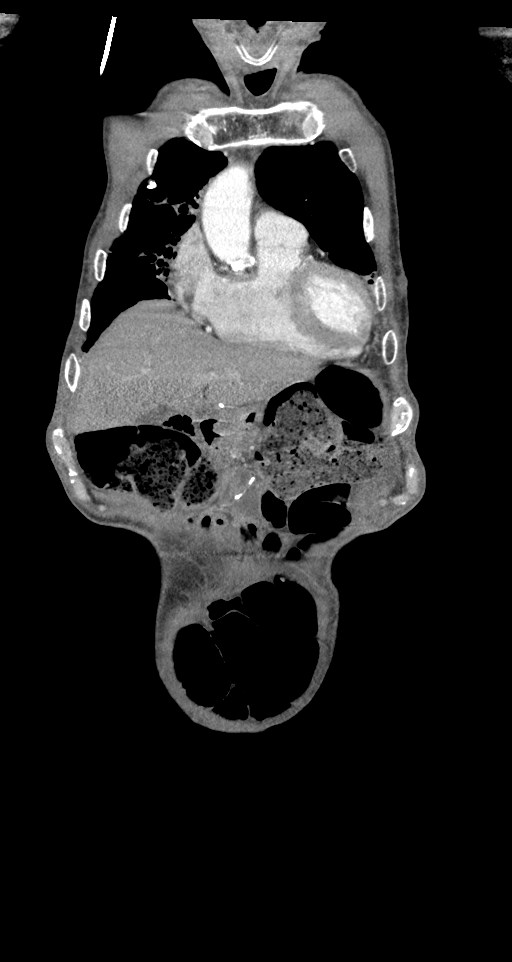
[im 59/146  mediastinal]
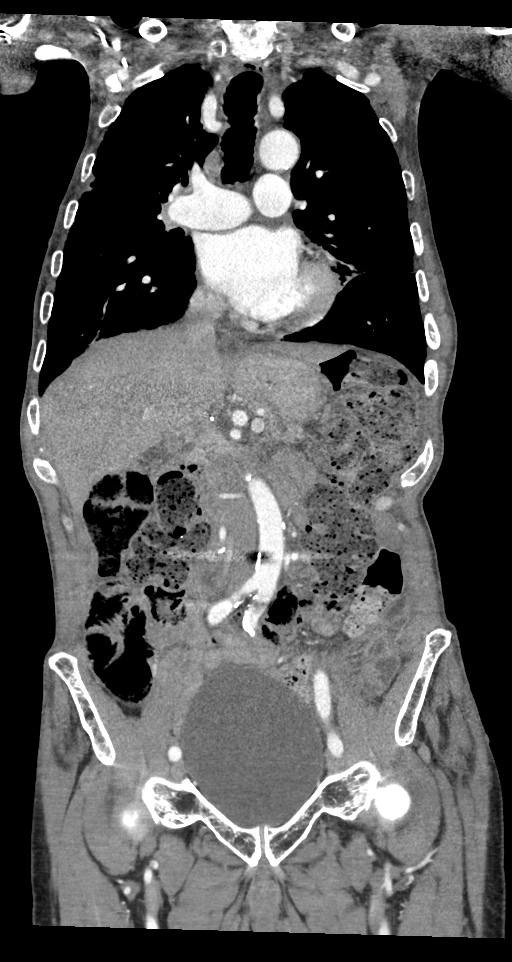
[im 88/146  mediastinal]
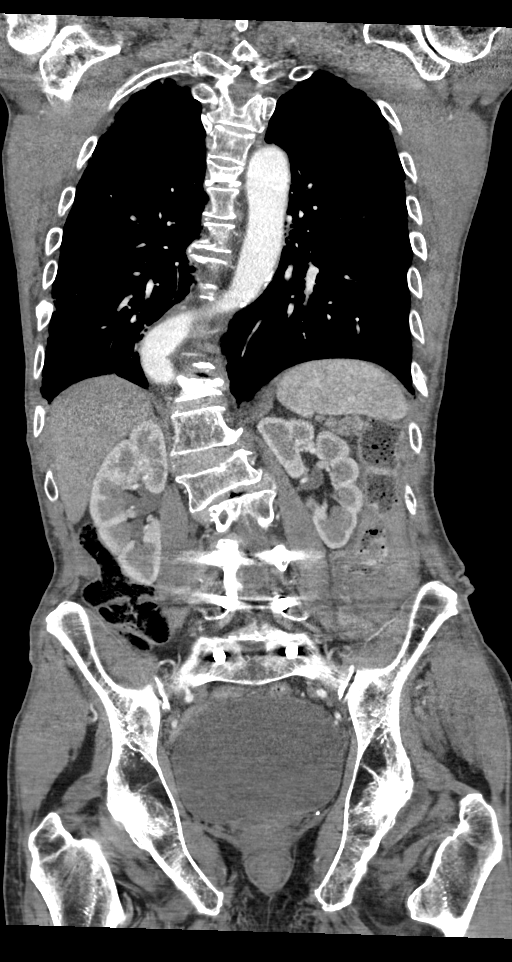

[13 of 36 positions shown; findings below may reference images not displayed]

RADIATION DOSE REDUCTION: This exam was performed according to the
departmental dose-optimization program which includes automated
exposure control, adjustment of the mA and/or kV according to
patient size and/or use of iterative reconstruction technique.

CONTRAST:  80mL OMNIPAQUE IOHEXOL 300 MG/ML  SOLN
FINDINGS: CT CHEST FINDINGS

Cardiovascular: No acute finding with aorta great vessels. No
pericardial fluid. No mediastinal hematoma.

Mediastinum/Nodes: Trachea and esophagus normal. No mediastinal
lymphadenopathy.

Lungs/Pleura: No pulmonary contusion or pneumothorax. No pleural
fluid.

There is chronic bronchiectasis in the RIGHT middle lobe and lingula
advanced from comparison exam. There is multiple peripheral
pulmonary nodules which also advanced but similar to comparison
exam. Largest nodule measures 10 mm in the RIGHT lower lobe (image
102/series 3) increased from 7 mm on comparison CT.

Musculoskeletal: No acute rib fracture. Several posterior RIGHT
healed rib fractures with callus formation. Mild callus formation
around several lateral LEFT rib fractures are favor subacute (image
114 and 172 of series 4)

CT ABDOMEN AND PELVIS FINDINGS

Hepatobiliary: Postcholecystectomy. The external bile duct measures
10 mm. There is a common bile duct stent on comparison exam.
Pancreatic duct measures 7 mm compared to 5 mm.

Pancreas: Pancreas is normal. No ductal dilatation. No pancreatic
inflammation.

Spleen: Spleen is normal

Adrenals/urinary tract: Adrenal glands kidneys are normal. Bladder
intact.

Stomach/Bowel: No evidence of bowel injury.

Vascular/Lymphatic: Abdominal aorta is normal caliber. There is no
retroperitoneal or periportal lymphadenopathy. No pelvic
lymphadenopathy.

Reproductive: Un remark

Other: No free fluid.

Musculoskeletal: Posterior lumbar fusion. Remote RIGHT transverse
process fractures.
IMPRESSION: Chest Impression:

1. No clear evidence acute thoracic trauma.
2. Bilateral chronic and subacute rib fractures.
3. Chronic RIGHT middle lobe and lingular bronchiectasis with
progressive peripheral nodularity. Favor chronic infectious or
inflammatory process. As no recent comparison available, recommend
follow-up CT in 3 to 6 months.

Abdomen / Pelvis Impression:

1. Increase in diameter of external bile duct and pancreatic duct.
Prior bile duct stent was in place. Recommend correlation with
surgical/oncological history. Favor benign postsurgical change.
2. No evidence of acute trauma in the abdomen pelvis.
3. Evidence of remote RIGHT transverse process fractures. Posterior
lumbar fusion.

## 2022-08-10 ENCOUNTER — Emergency Department (HOSPITAL_COMMUNITY): Payer: Medicare Other

## 2022-08-10 ENCOUNTER — Other Ambulatory Visit: Payer: Self-pay

## 2022-08-10 ENCOUNTER — Inpatient Hospital Stay (HOSPITAL_COMMUNITY)
Admission: EM | Admit: 2022-08-10 | Discharge: 2022-08-18 | DRG: 464 | Disposition: A | Discharge status: Skilled Nursing Facility | Payer: Medicare Other | Attending: Family Medicine | Admitting: Family Medicine

## 2022-08-10 ENCOUNTER — Encounter (HOSPITAL_COMMUNITY): Payer: Self-pay

## 2022-08-10 DIAGNOSIS — M419 Scoliosis, unspecified: Secondary | ICD-10-CM | POA: Diagnosis present

## 2022-08-10 DIAGNOSIS — R339 Retention of urine, unspecified: Secondary | ICD-10-CM | POA: Diagnosis not present

## 2022-08-10 DIAGNOSIS — Z8249 Family history of ischemic heart disease and other diseases of the circulatory system: Secondary | ICD-10-CM

## 2022-08-10 DIAGNOSIS — J9611 Chronic respiratory failure with hypoxia: Secondary | ICD-10-CM | POA: Diagnosis present

## 2022-08-10 DIAGNOSIS — Z881 Allergy status to other antibiotic agents status: Secondary | ICD-10-CM | POA: Diagnosis not present

## 2022-08-10 DIAGNOSIS — G4733 Obstructive sleep apnea (adult) (pediatric): Secondary | ICD-10-CM | POA: Diagnosis present

## 2022-08-10 DIAGNOSIS — Z888 Allergy status to other drugs, medicaments and biological substances status: Secondary | ICD-10-CM

## 2022-08-10 DIAGNOSIS — K5909 Other constipation: Secondary | ICD-10-CM | POA: Diagnosis present

## 2022-08-10 DIAGNOSIS — G4730 Sleep apnea, unspecified: Secondary | ICD-10-CM | POA: Diagnosis present

## 2022-08-10 DIAGNOSIS — M86172 Other acute osteomyelitis, left ankle and foot: Secondary | ICD-10-CM | POA: Diagnosis present

## 2022-08-10 DIAGNOSIS — F0393 Unspecified dementia, unspecified severity, with mood disturbance: Secondary | ICD-10-CM | POA: Diagnosis present

## 2022-08-10 DIAGNOSIS — G2581 Restless legs syndrome: Secondary | ICD-10-CM | POA: Diagnosis present

## 2022-08-10 DIAGNOSIS — Z885 Allergy status to narcotic agent status: Secondary | ICD-10-CM | POA: Diagnosis not present

## 2022-08-10 DIAGNOSIS — K219 Gastro-esophageal reflux disease without esophagitis: Secondary | ICD-10-CM | POA: Diagnosis present

## 2022-08-10 DIAGNOSIS — M1990 Unspecified osteoarthritis, unspecified site: Secondary | ICD-10-CM | POA: Diagnosis present

## 2022-08-10 DIAGNOSIS — K581 Irritable bowel syndrome with constipation: Secondary | ICD-10-CM | POA: Diagnosis present

## 2022-08-10 DIAGNOSIS — Z96651 Presence of right artificial knee joint: Secondary | ICD-10-CM | POA: Diagnosis present

## 2022-08-10 DIAGNOSIS — R1312 Dysphagia, oropharyngeal phase: Secondary | ICD-10-CM | POA: Diagnosis present

## 2022-08-10 DIAGNOSIS — Z8509 Personal history of malignant neoplasm of other digestive organs: Secondary | ICD-10-CM

## 2022-08-10 DIAGNOSIS — Z87891 Personal history of nicotine dependence: Secondary | ICD-10-CM | POA: Diagnosis not present

## 2022-08-10 DIAGNOSIS — Z803 Family history of malignant neoplasm of breast: Secondary | ICD-10-CM

## 2022-08-10 DIAGNOSIS — L02612 Cutaneous abscess of left foot: Secondary | ICD-10-CM | POA: Diagnosis not present

## 2022-08-10 DIAGNOSIS — L97529 Non-pressure chronic ulcer of other part of left foot with unspecified severity: Secondary | ICD-10-CM | POA: Diagnosis present

## 2022-08-10 DIAGNOSIS — M869 Osteomyelitis, unspecified: Secondary | ICD-10-CM | POA: Diagnosis not present

## 2022-08-10 DIAGNOSIS — L03116 Cellulitis of left lower limb: Secondary | ICD-10-CM | POA: Diagnosis present

## 2022-08-10 DIAGNOSIS — M79672 Pain in left foot: Secondary | ICD-10-CM | POA: Diagnosis present

## 2022-08-10 DIAGNOSIS — R338 Other retention of urine: Secondary | ICD-10-CM | POA: Diagnosis not present

## 2022-08-10 DIAGNOSIS — B9562 Methicillin resistant Staphylococcus aureus infection as the cause of diseases classified elsewhere: Secondary | ICD-10-CM | POA: Diagnosis present

## 2022-08-10 DIAGNOSIS — E785 Hyperlipidemia, unspecified: Secondary | ICD-10-CM | POA: Diagnosis present

## 2022-08-10 DIAGNOSIS — Z66 Do not resuscitate: Secondary | ICD-10-CM | POA: Diagnosis present

## 2022-08-10 DIAGNOSIS — Z79899 Other long term (current) drug therapy: Secondary | ICD-10-CM | POA: Diagnosis not present

## 2022-08-10 DIAGNOSIS — Z9981 Dependence on supplemental oxygen: Secondary | ICD-10-CM | POA: Diagnosis not present

## 2022-08-10 DIAGNOSIS — B965 Pseudomonas (aeruginosa) (mallei) (pseudomallei) as the cause of diseases classified elsewhere: Secondary | ICD-10-CM | POA: Diagnosis present

## 2022-08-10 DIAGNOSIS — Z23 Encounter for immunization: Secondary | ICD-10-CM | POA: Diagnosis present

## 2022-08-10 DIAGNOSIS — Z7982 Long term (current) use of aspirin: Secondary | ICD-10-CM

## 2022-08-10 HISTORY — DX: Unspecified dementia, unspecified severity, without behavioral disturbance, psychotic disturbance, mood disturbance, and anxiety: F03.90

## 2022-08-10 LAB — CBC WITH DIFFERENTIAL/PLATELET
Abs Immature Granulocytes: 0.04 10*3/uL (ref 0.00–0.07)
Basophils Absolute: 0 10*3/uL (ref 0.0–0.1)
Basophils Relative: 1 %
Eosinophils Absolute: 0.1 10*3/uL (ref 0.0–0.5)
Eosinophils Relative: 2 %
HCT: 42.9 % (ref 39.0–52.0)
Hemoglobin: 13.7 g/dL (ref 13.0–17.0)
Immature Granulocytes: 1 %
Lymphocytes Relative: 12 %
Lymphs Abs: 0.9 10*3/uL (ref 0.7–4.0)
MCH: 32.2 pg (ref 26.0–34.0)
MCHC: 31.9 g/dL (ref 30.0–36.0)
MCV: 100.9 fL — ABNORMAL HIGH (ref 80.0–100.0)
Monocytes Absolute: 0.4 10*3/uL (ref 0.1–1.0)
Monocytes Relative: 6 %
Neutro Abs: 5.8 10*3/uL (ref 1.7–7.7)
Neutrophils Relative %: 78 %
Platelets: 198 10*3/uL (ref 150–400)
RBC: 4.25 MIL/uL (ref 4.22–5.81)
RDW: 13.2 % (ref 11.5–15.5)
WBC: 7.3 10*3/uL (ref 4.0–10.5)
nRBC: 0 % (ref 0.0–0.2)

## 2022-08-10 LAB — COMPREHENSIVE METABOLIC PANEL
ALT: 20 U/L (ref 0–44)
AST: 26 U/L (ref 15–41)
Albumin: 3.8 g/dL (ref 3.5–5.0)
Alkaline Phosphatase: 88 U/L (ref 38–126)
Anion gap: 8 (ref 5–15)
BUN: 32 mg/dL — ABNORMAL HIGH (ref 8–23)
CO2: 30 mmol/L (ref 22–32)
Calcium: 9 mg/dL (ref 8.9–10.3)
Chloride: 102 mmol/L (ref 98–111)
Creatinine, Ser: 0.7 mg/dL (ref 0.61–1.24)
GFR, Estimated: >60 mL/min (ref >60)
Glucose, Bld: 102 mg/dL — ABNORMAL HIGH (ref 70–99)
Potassium: 4.2 mmol/L (ref 3.5–5.1)
Sodium: 140 mmol/L (ref 135–145)
Total Bilirubin: 0.4 mg/dL (ref 0.3–1.2)
Total Protein: 7.9 g/dL (ref 6.5–8.1)

## 2022-08-10 LAB — SEDIMENTATION RATE: Sed Rate: 32 mm/hr — ABNORMAL HIGH (ref 0–16)

## 2022-08-10 MED ORDER — PRAMIPEXOLE DIHYDROCHLORIDE 1 MG PO TABS
1.0000 mg | ORAL_TABLET | Freq: Three times a day (TID) | ORAL | Status: DC
  Administered 2022-08-11 – 2022-08-18 (×23): 1 mg via ORAL
  Filled 2022-08-10 (×23): qty 1

## 2022-08-10 MED ORDER — ACETAMINOPHEN 325 MG PO TABS
325.0000 mg | ORAL_TABLET | Freq: Four times a day (QID) | ORAL | Status: DC | PRN
  Administered 2022-08-11 – 2022-08-12 (×2): 325 mg via ORAL
  Filled 2022-08-10 (×3): qty 1

## 2022-08-10 MED ORDER — VITAMIN D 25 MCG (1000 UNIT) PO TABS
5000.0000 [IU] | ORAL_TABLET | Freq: Every morning | ORAL | Status: DC
  Administered 2022-08-11 – 2022-08-18 (×7): 5000 [IU] via ORAL
  Filled 2022-08-10 (×7): qty 5

## 2022-08-10 MED ORDER — DIVALPROEX SODIUM 125 MG PO CSDR
125.0000 mg | DELAYED_RELEASE_CAPSULE | Freq: Two times a day (BID) | ORAL | Status: DC
  Administered 2022-08-11 – 2022-08-18 (×15): 125 mg via ORAL
  Filled 2022-08-10 (×15): qty 1

## 2022-08-10 MED ORDER — HYDRALAZINE HCL 20 MG/ML IJ SOLN: 5.0000 mg | Freq: Four times a day (QID) | INTRAMUSCULAR | Status: DC | PRN

## 2022-08-10 MED ORDER — PANTOPRAZOLE SODIUM 40 MG PO TBEC
40.0000 mg | DELAYED_RELEASE_TABLET | Freq: Every day | ORAL | Status: DC
  Administered 2022-08-11 – 2022-08-18 (×7): 40 mg via ORAL
  Filled 2022-08-10 (×7): qty 1

## 2022-08-10 MED ORDER — SODIUM CHLORIDE 0.9 % IV SOLN
2.0000 g | Freq: Once | INTRAVENOUS | Status: AC
  Administered 2022-08-10: 2 g via INTRAVENOUS
  Filled 2022-08-10: qty 12.5

## 2022-08-10 MED ORDER — ENOXAPARIN SODIUM 40 MG/0.4ML IJ SOSY
40.0000 mg | PREFILLED_SYRINGE | INTRAMUSCULAR | Status: DC
  Administered 2022-08-11: 40 mg via SUBCUTANEOUS
  Filled 2022-08-10: qty 0.4

## 2022-08-10 MED ORDER — POLYETHYLENE GLYCOL 3350 17 G PO PACK
17.0000 g | PACK | Freq: Every day | ORAL | Status: DC
  Administered 2022-08-11 – 2022-08-12 (×2): 17 g via ORAL
  Filled 2022-08-10 (×3): qty 1

## 2022-08-10 MED ORDER — CALCIUM POLYCARBOPHIL 625 MG PO TABS
1250.0000 mg | ORAL_TABLET | Freq: Every day | ORAL | Status: DC
  Administered 2022-08-11 – 2022-08-18 (×7): 1250 mg via ORAL
  Filled 2022-08-10 (×7): qty 2

## 2022-08-10 MED ORDER — POLYETHYLENE GLYCOL 3350 17 G PO PACK
17.0000 g | PACK | Freq: Every day | ORAL | Status: DC | PRN
  Administered 2022-08-12 – 2022-08-13 (×2): 17 g via ORAL

## 2022-08-10 MED ORDER — VANCOMYCIN HCL 1500 MG/300ML IV SOLN
1500.0000 mg | Freq: Once | INTRAVENOUS | Status: AC
  Administered 2022-08-10: 1500 mg via INTRAVENOUS
  Filled 2022-08-10: qty 300

## 2022-08-10 MED ORDER — FLUTICASONE PROPIONATE 50 MCG/ACT NA SUSP: 1.0000 | Freq: Every day | NASAL | Status: DC | PRN

## 2022-08-10 MED ORDER — SODIUM CHLORIDE 0.9 % IV SOLN
2.0000 g | INTRAVENOUS | Status: DC
  Administered 2022-08-11 – 2022-08-14 (×4): 2 g via INTRAVENOUS
  Filled 2022-08-10 (×4): qty 20

## 2022-08-10 MED ORDER — ASPIRIN 81 MG PO TBEC
81.0000 mg | DELAYED_RELEASE_TABLET | Freq: Every day | ORAL | Status: DC
  Administered 2022-08-11 – 2022-08-18 (×7): 81 mg via ORAL
  Filled 2022-08-10 (×7): qty 1

## 2022-08-10 MED ORDER — SIMVASTATIN 20 MG PO TABS
40.0000 mg | ORAL_TABLET | Freq: Every day | ORAL | Status: DC
  Administered 2022-08-11 – 2022-08-13 (×4): 40 mg via ORAL
  Filled 2022-08-10 (×4): qty 2

## 2022-08-10 NOTE — Progress Notes (Signed)
Patient uses 2L [place] at night when he is home.  Placed patient on 2L and instructed him on how to use call bell for TV.

## 2022-08-10 NOTE — ED Triage Notes (Signed)
Sent by PCP for for possible infection in foot.

## 2022-08-10 NOTE — H&P (Addendum)
TRH H&P   Patient Demographics:    Samuel Olson, is a 81 y.o. male  MRN: [id number]   DOB - 03/27/1941  Admit Date - 08/10/2022  Outpatient Primary MD for the patient is Elyn Aquas  Referring MD/NP/PA: Dr Tinnie Gens  Outpatient Specialists: podiatry Dr Phyllis Ginger     Patient coming from: home  No chief complaint on file.     HPI:    Samuel Olson  is a 81 y.o. male, with past medical history of GERD, mood disorder, restless leg syndrome, remote history of seizures (last in 1973), patient was sent by his podiatrist Dr. 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 -In ED no significant lab abnormalities, blood cultures were sent, and patient was started on broad-spectrum IV antibiotics and [facility] hospitalist consulted to admit.  Records were sent by podiatry with the patient including MRI results, labs, ABI and history, please see under media section    Review of systems:     A full 10 point Review of Systems was done, except as stated above, all other Review of Systems were negative.   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   Sleep apnea    Uses O2 at 2L while sleeping; "can't tolerate CPAP OR BIPAP" * (04/17/2017)   Vertigo    Wears glasses       Past Surgical History:  Procedure Laterality Date   BACK SURGERY     BILIARY STENT PLACEMENT  12/10/2018   Procedure: BILIARY STENT PLACEMENT;  Surgeon: Irving Copas., MD;  Location: [place];  Service: Gastroenterology;;   BIOPSY  12/10/2018   Procedure: BIOPSY;  Surgeon: Irving Copas., MD;  Location: [place];  Service: Gastroenterology;;  CARDIAC CATHETERIZATION     CHOLECYSTECTOMY     COLONOSCOPY     ENDOSCOPIC RETROGRADE CHOLANGIOPANCREATOGRAPHY (ERCP) WITH PROPOFOL N/A 12/10/2018   Procedure: ENDOSCOPIC RETROGRADE CHOLANGIOPANCREATOGRAPHY (ERCP) WITH PROPOFOL;  Surgeon: Rush Landmark Telford Nab., MD;  Location: [place];  Service: Gastroenterology;  Laterality: N/A;  NEED  2 1/2 HOUR FOR CASE-JILL/PATTY   ESOPHAGOGASTRODUODENOSCOPY (EGD) WITH PROPOFOL N/A 12/10/2018   Procedure: ESOPHAGOGASTRODUODENOSCOPY (EGD) WITH PROPOFOL;  Surgeon: Rush Landmark Telford Nab., MD;  Location: [place];  Service: Gastroenterology;  Laterality: N/A;   FRACTURE SURGERY     Left plates and screws   OSTECTOMY METATARSAL Left    5th   pain stimulator   2012   pain stimulator   2012   removed 2012   PARTIAL KNEE ARTHROPLASTY Right 04/17/2017   Procedure: UNICOMPARTMENTAL KNEE;  Surgeon: Vickey Huger, MD;  Location: [place];  Service: Orthopedics;  Laterality: Right;   REMOVAL OF STONES  12/10/2018   Procedure: REMOVAL OF STONES;  Surgeon: Rush Landmark Telford Nab., MD;  Location: [place];  Service: Gastroenterology;;   REPLACEMENT  UNICONDYLAR JOINT KNEE Right 04/17/2017   SPHINCTEROTOMY  12/10/2018   Procedure: Joan Mayans;  Surgeon: Mansouraty, Telford Nab., MD;  Location: [place];  Service: Gastroenterology;;   Bess Kinds CHOLANGIOSCOPY N/A 12/10/2018   Procedure: IRJJOACZ CHOLANGIOSCOPY;  Surgeon: Irving Copas., MD;  Location: [place];  Service: Gastroenterology;  Laterality: N/A;   TONSILLECTOMY     UVULOPALATOPHARYNGOPLASTY (UPPP)/TONSILLECTOMY/SEPTOPLASTY     VASECTOMY        Social History:     Social History   Tobacco Use   Smoking status: Former    Types: Cigarettes    Quit date: 1966    Years since quitting: 57.8   Smokeless tobacco: Never   Tobacco comments:    quit smoking cigarettes in 1966  Substance Use Topics   Alcohol use: No       Family History :     Family History  Problem Relation Age of Onset   Heart disease Mother    Heart disease Father    Breast cancer Sister    Heart disease Brother    Colon cancer Neg Hx    Esophageal cancer Neg Hx    Inflammatory bowel disease Neg Hx    Liver disease Neg Hx    Pancreatic cancer Neg Hx    Rectal cancer Neg Hx    Stomach cancer Neg Hx       Home Medications:   Prior to Admission medications   Medication Sig Start Date End Date Taking? Authorizing Provider  acetaminophen (TYLENOL) 325 MG tablet Take 325 mg by mouth every 6 (six) hours as needed for moderate pain.   Yes [provider]  aspirin EC 81 MG tablet Take 81 mg by mouth daily. Swallow whole.   Yes [provider]  celecoxib (CELEBREX) 200 MG capsule Take 200 mg by mouth daily as needed.   Yes [provider]  Cholecalciferol (VITAMIN D3) 125 MCG (5000 UT) TABS Take 5,000 Units by mouth every morning.   Yes [provider]  divalproex (DEPAKOTE SPRINKLE) 125 MG capsule Take 125 mg by mouth 2 (two) times daily. 07/29/22  Yes [provider]  doxycycline (VIBRAMYCIN) 100 MG capsule Take 100 mg by mouth 2  (two) times daily. 07/29/22  Yes [provider]  fluticasone (FLONASE) 50 MCG/ACT nasal spray Place 1 spray into both nostrils daily as needed for allergies.   Yes [provider]  OXYGEN Inhale 2 L  into the lungs at bedtime.   Yes [provider]  polycarbophil (FIBERCON) 625 MG tablet Take 1,400 mg by mouth daily.   Yes [provider]  polyethylene glycol (MIRALAX / GLYCOLAX) 17 g packet Take 17 g by mouth daily as needed for mild constipation.   Yes [provider]  pramipexole (MIRAPEX) 1 MG tablet Take 1 mg by mouth 3 (three) times daily. 01/31/22  Yes [provider]  simvastatin (ZOCOR) 40 MG tablet Take 40 mg by mouth at bedtime. for cholesterol 02/25/17  Yes [provider]  CALCIUM CITRATE PO Take 1 tablet by mouth daily. Patient not taking: Reported on 08/10/2022    [provider]  Glucosamine-Chondroit-Vit C-Mn (GLUCOSAMINE 1500 COMPLEX PO) Take 1 capsule by mouth 2 (two) times daily. Patient not taking: Reported on 08/10/2022    [provider]  Multiple Vitamins-Minerals (PRESERVISION AREDS PO) Take 1 tablet by mouth 2 (two) times daily. Patient not taking: Reported on 08/10/2022    [provider]  omeprazole (PRILOSEC) 20 MG capsule TAKE 1 CAPSULE (20 MG TOTAL) BY MOUTH 2 (TWO) TIMES DAILY BEFORE A MEAL. Patient not taking: Reported on 08/10/2022 08/04/21   Mansouraty, Telford Nab., MD  rOPINIRole (REQUIP) 2 MG tablet Take 2 mg by mouth 3 (three) times daily. Patient not taking: Reported on 08/10/2022 01/07/22   [provider]     Allergies:     Allergies  Allergen Reactions   Phenergan [Promethazine] Other (See Comments)    Cramps, shakes.   Augmentin [Amoxicillin-Pot Clavulanate] Other (See Comments)    UNKNOWN [SEE BELOW]  Has patient had a PCN reaction causing immediate rash, facial/tongue/throat swelling, SOB or lightheadedness with hypotension: Unknown Has patient had a PCN  reaction causing severe rash involving mucus membranes or skin necrosis: Uknown PATIENT HAS HAD A PCN REACTION THAT REQUIRED HOSPITALIZATION: >> UNSPECIFIED REACTION WHILE HE WAS ALREADY ADMITTED TO A HOSPITAL.  Has patient had a PCN reaction occurring within the last 10 years: No    Oxycodone Other (See Comments)    UNSPECIFIED REACTION  TOLERATES APAP WITHOUT OXYCODONE   Percocet [Oxycodone-Acetaminophen] Other (See Comments)    Unknown. Tolerates acetaminophen alone.     Physical Exam:   Vitals  Blood pressure (!) 160/98, pulse 71, temperature 99.2 F (37.3 C), temperature source Oral, resp. rate 20, height '5\' 7"'$  (1.702 m), weight 68 kg, SpO2 94 %.   1. General elderly male, laying in bed, no apparent distress.  2. Normal affect and insight, Not Suicidal or Homicidal, Awake Alert, Oriented X 3.  3. No F.N deficits, ALL C.Nerves Intact, Strength 5/5 all 4 extremities, Sensation intact all 4 extremities, Plantars down going.  4. Ears and Eyes appear Normal, Conjunctivae clear, PERRLA. Moist Oral Mucosa.  5. Supple Neck, No JVD, No cervical lymphadenopathy appriciated, No Carotid Bruits.  6. Symmetrical Chest wall movement, Good air movement bilaterally, CTAB.  7. RRR, No Gallops, Rubs or Murmurs, No Parasternal Heave.  8. Positive Bowel Sounds, Abdomen Soft, No tenderness, No organomegaly appriciated,No rebound -guarding or rigidity.  9.  No Cyanosis, Normal Skin Turgor, foot with lateral area with ulceration, please see picture below.  10. Good muscle tone,  joints appear normal , no effusions, Normal ROM.     Data Review:    CBC Recent Labs  Lab 08/10/22 1720  WBC 7.3  HGB 13.7  HCT 42.9  PLT 198  MCV 100.9*  MCH 32.2  MCHC 31.9  RDW 13.2  LYMPHSABS 0.9  MONOABS 0.4  EOSABS 0.1  BASOSABS 0.0   ------------------------------------------------------------------------------------------------------------------  Chemistries  Recent Labs  Lab  08/10/22 1720  NA 140  K 4.2  CL 102  CO2 30  GLUCOSE 102*  BUN 32*  CREATININE 0.70  CALCIUM 9.0  AST 26  ALT 20  ALKPHOS 88  BILITOT 0.4   ------------------------------------------------------------------------------------------------------------------ estimated creatinine clearance is 67.7 mL/min (by C-G formula based on SCr of 0.7 mg/dL). ------------------------------------------------------------------------------------------------------------------ No results for input(s): "TSH", "T4TOTAL", "T3FREE", "THYROIDAB" in the last 72 hours.  Invalid input(s): "FREET3"  Coagulation profile No results for input(s): "INR", "PROTIME" in the last 168 hours. ------------------------------------------------------------------------------------------------------------------- No results for input(s): "DDIMER" in the last 72 hours. -------------------------------------------------------------------------------------------------------------------  Cardiac Enzymes No results for input(s): "CKMB", "TROPONINI", "MYOGLOBIN" in the last 168 hours.  Invalid input(s): "CK" ------------------------------------------------------------------------------------------------------------------ No results found for: "BNP"   ---------------------------------------------------------------------------------------------------------------  Urinalysis    Component Value Date/Time   COLORURINE YELLOW 03/14/2022 [place] 03/14/2022 0047   LABSPEC 1.034 (H) 03/14/2022 0047   PHURINE 6.0 03/14/2022 0047   GLUCOSEU NEGATIVE 03/14/2022 0047   HGBUR NEGATIVE 03/14/2022 0047   BILIRUBINUR NEGATIVE 03/14/2022 0047   KETONESUR 20 (A) 03/14/2022 0047   PROTEINUR NEGATIVE 03/14/2022 0047   NITRITE NEGATIVE 03/14/2022 0047   LEUKOCYTESUR NEGATIVE 03/14/2022 0047    ----------------------------------------------------------------------------------------------------------------   Imaging  Results:    DG Foot Complete Left  Result Date: 08/10/2022 CLINICAL DATA:  Foot infection, evaluate for osteo EXAM: LEFT FOOT - COMPLETE 3+ VIEW COMPARISON:  05/27/2022 FINDINGS: 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 New soft tissue gas with soft tissue swelling adjacent to the surgical margin. Osteomyelitis is presumed. No fracture or dislocation is seen. The joint spaces are preserved. 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 Adjacent new soft tissue swelling/gas, suggesting soft tissue infection, with presumed osteomyelitis at the surgical margin. Electronically Signed   By: Julian Hy M.D.   On: 08/10/2022 21:37       Assessment & Plan:    Principal Problem:   Acute osteomyelitis of left foot (HCC)   Acute left foot fifth metatarsal osteomyelitis -Patient was sent by his primary podiatrist, given MRI done recently showing evidence of osteomyelitis despite being on p.o. doxycycline as an outpatient -Patient had ABI done as an outpatient of left foot 1.4 (which is indicative of stiff  pedal arteries, but not necessarily diagnostic of peripheral vascular disease) -We will send blood cultures -Continue with IV vancomycin and Rocephin for now, antibiotics can be adjusted after surgery and further culture data -As discussed with Dr. Caprice Beaver, he will operate on Friday, he will evaluate and assist tomorrow.   Hyperlipidemia- Continue with statin  Mood disorder -Continue with Depakote (patient on Depakote for mood disorder, not for seizure as last seizure event was in the 1973)  Restless Leg syndrome -Continue with home medications  DVT Prophylaxis   Lovenox  AM Labs Ordered, also please review Full Orders  Family Communication: Admission, patients condition and plan of care including tests being ordered have been discussed with the patient and wife and son at bedside who indicate understanding and agree with the plan and Code Status.  Code Status  DNR, confirmed with family  Likely DC to  home, vs SNF  Condition GUARDED    Consults called: d/w podiatry Dr Phyllis Ginger by phone [phone]    Admission status: inpatient    Time spent in minutes : 57 minutes   Phillips Climes M.D on 08/10/2022 at 10:12 PM  [facility] - Office  [phone]

## 2022-08-10 NOTE — ED Notes (Signed)
Pt given bagged lunch.

## 2022-08-10 NOTE — ED Provider Triage Note (Signed)
Emergency Medicine Provider Triage Evaluation Note  Samuel Olson , a 81 y.o. male  was evaluated in triage.  Pt complains of left foot pain.  Patient is sent here by Dr. Caprice Beaver after an MRI showing osteomyelitis on L foot.  States his doctor said he needs admission for possible surgery on Friday.  Review of Systems  Positive: As above Negative: As above  Physical Exam  BP 128/79 (BP Location: Right Arm)   Pulse 73   Temp 97.9 F (36.6 C) (Oral)   Resp 20   Ht '5\' 7"'$  (1.702 m)   Wt 68 kg   SpO2 94%   BMI 23.49 kg/m  Gen:   Awake, no distress   Resp:  Normal effort  MSK:   Moves extremities without difficulty  Other:  Wound on L foot  Medical Decision Making  Medically screening exam initiated at 4:40 PM.  Appropriate orders placed.  Samuel Olson was informed that the remainder of the evaluation will be completed by another provider, this initial triage assessment does not replace that evaluation, and the importance of remaining in the ED until their evaluation is complete.  Work-up initiated.   Samuel Olson, [place] 08/12/22 [phone]

## 2022-08-10 NOTE — ED Notes (Signed)
Pt's L foot re-wrapped in [facility] per request Dr Matilde Sprang.

## 2022-08-10 NOTE — Consult Note (Cosign Needed)
Pharmacy Antibiotic Note  Samuel Olson is a 81 y.o. male admitted on 08/10/2022 with  osteomyelitis of foot .  Pharmacy has been consulted for vancomycin dosing.  Plan: Will give LD of vanc='1500mg'$  ('23mg'$ /kg) To re-evaluate for continuation of therapy in AM  Height: '5\' 7"'$  (170.2 cm) Weight: 68 kg (150 lb) IBW/kg (Calculated) : 66.1  Temp (24hrs), Avg:98.6 F (37 C), Min:97.9 F (36.6 C), Max:99.2 F (37.3 C)  Recent Labs  Lab 08/10/22 1720  WBC 7.3  CREATININE 0.70    Estimated Creatinine Clearance: 67.7 mL/min (by C-G formula based on SCr of 0.7 mg/dL).    Allergies  Allergen Reactions   Phenergan [Promethazine] Other (See Comments)    Cramps, shakes.   Augmentin [Amoxicillin-Pot Clavulanate] Other (See Comments)    UNKNOWN [SEE BELOW]  Has patient had a PCN reaction causing immediate rash, facial/tongue/throat swelling, SOB or lightheadedness with hypotension: Unknown Has patient had a PCN reaction causing severe rash involving mucus membranes or skin necrosis: Uknown PATIENT HAS HAD A PCN REACTION THAT REQUIRED HOSPITALIZATION: >> UNSPECIFIED REACTION WHILE HE WAS ALREADY ADMITTED TO A HOSPITAL.  Has patient had a PCN reaction occurring within the last 10 years: No    Oxycodone Other (See Comments)    UNSPECIFIED REACTION  TOLERATES APAP WITHOUT OXYCODONE   Percocet [Oxycodone-Acetaminophen] Other (See Comments)    Unknown. Tolerates acetaminophen alone.   Plan:  Will give LD of vanc='1500mg'$  ('23mg'$ /kg) To re-evaluate for continuation of therapy in AM  Thank you for allowing pharmacy to be a part of this patient's care.   Berta Minor 08/10/2022 9:49 PM

## 2022-08-11 ENCOUNTER — Encounter (HOSPITAL_COMMUNITY): Payer: Self-pay | Admitting: Internal Medicine

## 2022-08-11 DIAGNOSIS — M86172 Other acute osteomyelitis, left ankle and foot: Secondary | ICD-10-CM | POA: Diagnosis not present

## 2022-08-11 DIAGNOSIS — G2581 Restless legs syndrome: Secondary | ICD-10-CM

## 2022-08-11 DIAGNOSIS — E785 Hyperlipidemia, unspecified: Secondary | ICD-10-CM | POA: Diagnosis not present

## 2022-08-11 LAB — BASIC METABOLIC PANEL
Anion gap: 4 — ABNORMAL LOW (ref 5–15)
BUN: 31 mg/dL — ABNORMAL HIGH (ref 8–23)
CO2: 31 mmol/L (ref 22–32)
Calcium: 8.5 mg/dL — ABNORMAL LOW (ref 8.9–10.3)
Chloride: 105 mmol/L (ref 98–111)
Creatinine, Ser: 0.66 mg/dL (ref 0.61–1.24)
GFR, Estimated: >60 mL/min (ref >60)
Glucose, Bld: 94 mg/dL (ref 70–99)
Potassium: 4.2 mmol/L (ref 3.5–5.1)
Sodium: 140 mmol/L (ref 135–145)

## 2022-08-11 LAB — HEMOGLOBIN A1C
Hgb A1c MFr Bld: 5.4 % (ref 4.8–5.6)
Mean Plasma Glucose: 108.28 mg/dL

## 2022-08-11 LAB — PREALBUMIN: Prealbumin: 19 mg/dL (ref 18–38)

## 2022-08-11 LAB — CBC
HCT: 36.6 % — ABNORMAL LOW (ref 39.0–52.0)
Hemoglobin: 11.5 g/dL — ABNORMAL LOW (ref 13.0–17.0)
MCH: 31.8 pg (ref 26.0–34.0)
MCHC: 31.4 g/dL (ref 30.0–36.0)
MCV: 101.1 fL — ABNORMAL HIGH (ref 80.0–100.0)
Platelets: 182 10*3/uL (ref 150–400)
RBC: 3.62 MIL/uL — ABNORMAL LOW (ref 4.22–5.81)
RDW: 13.2 % (ref 11.5–15.5)
WBC: 7.1 10*3/uL (ref 4.0–10.5)
nRBC: 0 % (ref 0.0–0.2)

## 2022-08-11 LAB — C-REACTIVE PROTEIN: CRP: 0.6 mg/dL (ref <1.0)

## 2022-08-11 MED ORDER — VANCOMYCIN HCL 1250 MG/250ML IV SOLN
1250.0000 mg | INTRAVENOUS | Status: DC
  Administered 2022-08-11 – 2022-08-13 (×3): 1250 mg via INTRAVENOUS
  Filled 2022-08-11 (×3): qty 250

## 2022-08-11 MED ORDER — VANCOMYCIN HCL 750 MG/150ML IV SOLN
750.0000 mg | Freq: Two times a day (BID) | INTRAVENOUS | Status: DC
  Filled 2022-08-11: qty 150

## 2022-08-11 MED ORDER — ORAL CARE MOUTH RINSE: 15.0000 mL | OROMUCOSAL | Status: DC | PRN

## 2022-08-11 MED ORDER — MELATONIN 3 MG PO TABS
6.0000 mg | ORAL_TABLET | Freq: Every day | ORAL | Status: DC
  Administered 2022-08-11 – 2022-08-17 (×8): 6 mg via ORAL
  Filled 2022-08-11 (×8): qty 2

## 2022-08-11 NOTE — Plan of Care (Signed)
  Problem: Acute Rehab PT Goals(only PT should resolve) Goal: Pt Will Go Supine/Side To Sit Outcome: Progressing Flowsheets (Taken 08/11/2022 1116) Pt will go Supine/Side to Sit:  with supervision  with modified independence Goal: Patient Will Transfer Sit To/From Stand Outcome: Progressing Flowsheets (Taken 08/11/2022 1116) Patient will transfer sit to/from stand:  with supervision  with modified independence Goal: Pt Will Transfer Bed To Chair/Chair To Bed Outcome: Progressing Flowsheets (Taken 08/11/2022 1116) Pt will Transfer Bed to Chair/Chair to Bed:  with supervision  with modified independence Goal: Pt Will Ambulate Outcome: Progressing Flowsheets (Taken 08/11/2022 1116) Pt will Ambulate:  25 feet  with supervision  with modified independence  with rolling walker   11:16 AM, 08/11/22 [name], MPT Physical Therapist with [facility] [phone] office [phone] mobile phone

## 2022-08-11 NOTE — ED Provider Notes (Signed)
[facility] Provider Note  CSN: [id number] Arrival date & time: 08/10/22 1438  Chief Complaint(s) No chief complaint on file.  HPI Samuel Olson is a 81 y.o. male with PMH IBS, scoliosis, who presents emergency department for evaluation of left foot osteomyelitis.  Patient recently seen by his podiatrist Dr. Caprice Beaver in the outpatient setting who obtained an MRI concerning for multiple abscesses in the feet and osteomyelitis.  Patient recently has completed a course of doxycycline without improvement.  Dr. Caprice Beaver recommended the patient come to the emergency department for admission, IV antibiotics and ultimate surgical debridement on 08/12/2022.  Patient alert and oriented on arrival and hemodynamically stable with no chest pain, shortness of breath, abdominal pain, nausea, vomiting, fever, chills or other systemic symptoms.   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[place]) 06/11/2022   Abnormal barium swallow 06/11/2022   Oropharyngeal dysphagia 06/11/2022   Unintentional weight loss 06/11/2022   Fecal impaction ([place]) 06/11/2022   Other constipation 06/11/2022   Skin rash 06/11/2022   Anorexia  06/11/2022   Frequent falls 03/14/2022   Demand ischemia 11/14/2021   RLS (restless legs syndrome) 11/14/2021   HLD (hyperlipidemia) 11/14/2021   CAP (community acquired pneumonia) 11/13/2021   Sleep apnea 11/13/2021   Chronic constipation 07/17/2021   Dyspepsia 07/17/2021   Gastroesophageal reflux disease 03/22/2020   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 Authorizing Provider  acetaminophen (TYLENOL) 325 MG tablet Take 325 mg by mouth every 6 (six) hours as needed for moderate pain.   Yes [provider]  aspirin EC 81 MG tablet Take 81 mg by mouth daily. Swallow whole.   Yes [provider]  celecoxib (CELEBREX) 200 MG capsule Take 200 mg by mouth daily as needed.   Yes [provider]  Cholecalciferol (VITAMIN D3) 125 MCG (5000 UT) TABS Take 5,000 Units by mouth every morning.   Yes [provider]  divalproex (DEPAKOTE SPRINKLE) 125 MG capsule Take 125 mg by mouth 2 (  two) times daily. 07/29/22  Yes [provider]  doxycycline (VIBRAMYCIN) 100 MG capsule Take 100 mg by mouth 2 (two) times daily. 07/29/22  Yes [provider]  fluticasone (FLONASE) 50 MCG/ACT nasal spray Place 1 spray into both nostrils daily as needed for allergies.   Yes [provider]  OXYGEN Inhale 2 L into the lungs at bedtime.   Yes [provider]  polycarbophil (FIBERCON) 625 MG tablet Take  1,400 mg by mouth daily.   Yes [provider]  polyethylene glycol (MIRALAX / GLYCOLAX) 17 g packet Take 17 g by mouth daily as needed for mild constipation.   Yes [provider]  pramipexole (MIRAPEX) 1 MG tablet Take 1 mg by mouth 3 (three) times daily. 01/31/22  Yes [provider]  simvastatin (ZOCOR) 40 MG tablet Take 40 mg by mouth at bedtime. for cholesterol 02/25/17  Yes [provider]  CALCIUM CITRATE PO Take 1 tablet by mouth daily. Patient not taking: Reported on 08/10/2022    [provider]  Glucosamine-Chondroit-Vit C-Mn (GLUCOSAMINE 1500 COMPLEX PO) Take 1 capsule by mouth 2 (two) times daily. Patient not taking: Reported on 08/10/2022    [provider]  Multiple Vitamins-Minerals (PRESERVISION AREDS PO) Take 1 tablet by mouth 2 (two) times daily. Patient not taking: Reported on 08/10/2022    [provider]  omeprazole (PRILOSEC) 20 MG capsule TAKE 1 CAPSULE (20 MG TOTAL) BY MOUTH 2 (TWO) TIMES DAILY BEFORE A MEAL. Patient not taking: Reported on 08/10/2022 08/04/21   Mansouraty, Telford Nab., MD  rOPINIRole (REQUIP) 2 MG tablet Take 2 mg by mouth 3 (three) times daily. Patient not taking: Reported on 08/10/2022 01/07/22   [provider]                                                                                                                                    Past Surgical History Past Surgical History:  Procedure Laterality Date   BACK SURGERY     BILIARY STENT PLACEMENT  12/10/2018   Procedure: BILIARY STENT PLACEMENT;  Surgeon: Irving Copas., MD;  Location: [facility];  Service: Gastroenterology;;   BIOPSY  12/10/2018   Procedure: BIOPSY;  Surgeon: Irving Copas., MD;  Location: [facility];  Service: Gastroenterology;;   CARDIAC CATHETERIZATION     CHOLECYSTECTOMY     COLONOSCOPY     ENDOSCOPIC RETROGRADE CHOLANGIOPANCREATOGRAPHY (ERCP) WITH PROPOFOL N/A 12/10/2018   Procedure:  ENDOSCOPIC RETROGRADE CHOLANGIOPANCREATOGRAPHY (ERCP) WITH PROPOFOL;  Surgeon: Irving Copas., MD;  Location: [facility];  Service: Gastroenterology;  Laterality: N/A;  NEED  2 1/2 HOUR FOR CASE-JILL/PATTY   ESOPHAGOGASTRODUODENOSCOPY (EGD) WITH PROPOFOL N/A 12/10/2018   Procedure: ESOPHAGOGASTRODUODENOSCOPY (EGD) WITH PROPOFOL;  Surgeon: Rush Landmark Telford Nab., MD;  Location: [facility];  Service: Gastroenterology;  Laterality: N/A;   FRACTURE SURGERY     Left plates and screws   OSTECTOMY METATARSAL Left  5th   pain stimulator   2012   pain stimulator   2012   removed 2012   PARTIAL KNEE ARTHROPLASTY Right 04/17/2017   Procedure: UNICOMPARTMENTAL KNEE;  Surgeon: Vickey Huger, MD;  Location: [facility];  Service: Orthopedics;  Laterality: Right;   REMOVAL OF STONES  12/10/2018   Procedure: REMOVAL OF STONES;  Surgeon: Rush Landmark Telford Nab., MD;  Location: [facility];  Service: Gastroenterology;;   REPLACEMENT UNICONDYLAR JOINT KNEE Right 04/17/2017   SPHINCTEROTOMY  12/10/2018   Procedure: Joan Mayans;  Surgeon: Mansouraty, Telford Nab., MD;  Location: [facility];  Service: Gastroenterology;;   Bess Kinds CHOLANGIOSCOPY N/A 12/10/2018   Procedure: DGUYQIHK CHOLANGIOSCOPY;  Surgeon: Irving Copas., MD;  Location: [facility];  Service: Gastroenterology;  Laterality: N/A;   TONSILLECTOMY     UVULOPALATOPHARYNGOPLASTY (UPPP)/TONSILLECTOMY/SEPTOPLASTY     VASECTOMY     Family History Family History  Problem Relation Age of Onset   Heart disease Mother    Heart disease Father    Breast cancer Sister    Heart disease Brother    Colon cancer Neg Hx    Esophageal cancer Neg Hx    Inflammatory bowel disease Neg Hx    Liver disease Neg Hx    Pancreatic cancer Neg Hx    Rectal cancer Neg Hx    Stomach cancer Neg Hx     Social History Social History   Tobacco Use   Smoking status: Former    Types: Cigarettes    Quit date: 1966    Years since quitting:  57.8   Smokeless tobacco: Never   Tobacco comments:    quit smoking cigarettes in 1966  Vaping Use   Vaping Use: Never used  Substance Use Topics   Alcohol use: No   Drug use: No   Allergies Phenergan [promethazine], Augmentin [amoxicillin-pot clavulanate], Oxycodone, and Percocet [oxycodone-acetaminophen]  Review of Systems Review of Systems  Skin:  Positive for wound.    Physical Exam Vital Signs  I have reviewed the triage vital signs BP 128/66 (BP Location: Left Arm)   Pulse 62   Temp 98 F (36.7 C) (Oral)   Resp 18   Ht '5\' 7"'$  (1.702 m)   Wt 68 kg   SpO2 95%   BMI 23.49 kg/m   Physical Exam Vitals and nursing note reviewed.  Constitutional:      General: He is not in acute distress.    Appearance: He is well-developed.  HENT:     Head: Normocephalic and atraumatic.  Eyes:     Conjunctiva/sclera: Conjunctivae normal.  Cardiovascular:     Rate and Rhythm: Normal rate and regular rhythm.     Heart sounds: No murmur heard. Pulmonary:     Effort: Pulmonary effort is normal. No respiratory distress.     Breath sounds: Normal breath sounds.  Abdominal:     Palpations: Abdomen is soft.     Tenderness: There is no abdominal tenderness.  Musculoskeletal:        General: No swelling.     Cervical back: Neck supple.  Skin:    General: Skin is warm and dry.     Capillary Refill: Capillary refill takes less than 2 seconds.     Findings: Lesion present.  Neurological:     Mental Status: He is alert.  Psychiatric:        Mood and Affect: Mood normal.     ED Results and Treatments Labs (all labs ordered are listed, but only abnormal results are displayed) Labs  Reviewed  CBC WITH DIFFERENTIAL/PLATELET - Abnormal; Notable for the following components:      Result Value   MCV 100.9 (*)    All other components within normal limits  COMPREHENSIVE METABOLIC PANEL - Abnormal; Notable for the following components:   Glucose, Bld 102 (*)    BUN 32 (*)    All other  components within normal limits  SEDIMENTATION RATE - Abnormal; Notable for the following components:   Sed Rate 32 (*)    All other components within normal limits  BASIC METABOLIC PANEL - Abnormal; Notable for the following components:   BUN 31 (*)    Calcium 8.5 (*)    Anion gap 4 (*)    All other components within normal limits  CBC - Abnormal; Notable for the following components:   RBC 3.62 (*)    Hemoglobin 11.5 (*)    HCT 36.6 (*)    MCV 101.1 (*)    All other components within normal limits  CULTURE, BLOOD (ROUTINE X 2)  CULTURE, BLOOD (ROUTINE X 2)  C-REACTIVE PROTEIN  HEMOGLOBIN A1C  PREALBUMIN                                                                                                                          Radiology DG Foot Complete Left  Result Date: 08/10/2022 CLINICAL DATA:  Foot infection, evaluate for osteo EXAM: LEFT FOOT - COMPLETE 3+ VIEW COMPARISON:  05/27/2022 FINDINGS: 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 New soft tissue gas with soft tissue swelling adjacent to the surgical margin. Osteomyelitis is presumed. No fracture or dislocation is seen. The joint spaces are preserved. 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 Adjacent new soft tissue swelling/gas, suggesting soft tissue infection, with presumed osteomyelitis at the surgical margin. Electronically Signed   By: Julian Hy M.D.   On: 08/10/2022 21:37    Pertinent labs & imaging results that were available during my care of the patient were reviewed by me and considered in my medical decision making (see MDM for details).  Medications Ordered in ED Medications  acetaminophen (TYLENOL) tablet 325 mg (325 mg Oral Given 08/11/22 0223)  aspirin EC tablet 81 mg (81 mg Oral Given 08/11/22 0825)  simvastatin (ZOCOR) tablet 40 mg (40 mg Oral Given 08/11/22 0002)  pantoprazole (PROTONIX) EC tablet 40 mg (40 mg Oral Given 08/11/22 0826)  polycarbophil (FIBERCON) tablet 1,250 mg  (1,250 mg Oral Given 08/11/22 0825)  polyethylene glycol (MIRALAX / GLYCOLAX) packet 17 g (has no administration in time range)  divalproex (DEPAKOTE SPRINKLE) capsule 125 mg (125 mg Oral Given 08/11/22 0826)  pramipexole (MIRAPEX) tablet 1 mg (1 mg Oral Given 08/11/22 0826)  cholecalciferol (VITAMIN D3) 25 MCG (1000 UNIT) tablet 5,000 Units (5,000 Units Oral Given 08/11/22 0825)  fluticasone (FLONASE) 50 MCG/ACT nasal spray 1 spray (has no administration in time range)  cefTRIAXone (ROCEPHIN) 2 g in sodium chloride 0.9 % 100 mL IVPB (0 g Intravenous Stopped  08/11/22 0536)  hydrALAZINE (APRESOLINE) injection 5 mg (has no administration in time range)  polyethylene glycol (MIRALAX / GLYCOLAX) packet 17 g (17 g Oral Given 08/11/22 0001)  Oral care mouth rinse (has no administration in time range)  melatonin tablet 6 mg (6 mg Oral Given 08/11/22 0220)  vancomycin (VANCOREADY) IVPB 1250 mg/250 mL (has no administration in time range)  ceFEPIme (MAXIPIME) 2 g in sodium chloride 0.9 % 100 mL IVPB (0 g Intravenous Stopped 08/10/22 2257)  vancomycin (VANCOREADY) IVPB 1500 mg/300 mL (1,500 mg Intravenous New Bag/Given 08/10/22 2258)                                                                                                                                     Procedures Procedures  (including critical care time)  Medical Decision Making / ED Course   This patient presents to the ED for concern of left foot pain and concern for osteomyelitis, this involves an extensive number of treatment options, and is a complaint that carries with it a high risk of complications and morbidity.  The differential diagnosis includes osteomyelitis, abscess, nec Fasc, cellulitis  MDM: Patient seen in the emergency department for evaluation of foot pain and concern for osteomyelitis.  Physical exam with erythema and an open wound to the sole of the left foot.  Laboratory evaluation largely unremarkable outside of a minimally  elevated sed rate of 32.  X-rays concerning for osteomyelitis.  Given no white count and only minimally elevated inflammatory markers, I have lower suspicion for acute necrotizing fasciitis and patient does appear to be clinically consistent with simple osteomyelitis of the foot.  Broad-spectrum antibiotics initiated and patient admitted to the hospitalist.   Additional history obtained: -Additional history obtained from wife -External records from outside source obtained and reviewed including: Chart review including previous notes, labs, imaging, consultation notes   Lab Tests: -I ordered, reviewed, and interpreted labs.   The pertinent results include:   Labs Reviewed  CBC WITH DIFFERENTIAL/PLATELET - Abnormal; Notable for the following components:      Result Value   MCV 100.9 (*)    All other components within normal limits  COMPREHENSIVE METABOLIC PANEL - Abnormal; Notable for the following components:   Glucose, Bld 102 (*)    BUN 32 (*)    All other components within normal limits  SEDIMENTATION RATE - Abnormal; Notable for the following components:   Sed Rate 32 (*)    All other components within normal limits  BASIC METABOLIC PANEL - Abnormal; Notable for the following components:   BUN 31 (*)    Calcium 8.5 (*)    Anion gap 4 (*)    All other components within normal limits  CBC - Abnormal; Notable for the following components:   RBC 3.62 (*)    Hemoglobin 11.5 (*)    HCT 36.6 (*)    MCV 101.1 (*)  All other components within normal limits  CULTURE, BLOOD (ROUTINE X 2)  CULTURE, BLOOD (ROUTINE X 2)  C-REACTIVE PROTEIN  HEMOGLOBIN A1C  PREALBUMIN       Imaging Studies ordered: I ordered imaging studies including foot x-ray I independently visualized and interpreted imaging. I agree with the radiologist interpretation   Medicines ordered and prescription drug management: Meds ordered this encounter  Medications   ceFEPIme (MAXIPIME) 2 g in sodium  chloride 0.9 % 100 mL IVPB   vancomycin (VANCOREADY) IVPB 1500 mg/300 mL    Order Specific Question:   Indication:    Answer:   Osteomyelitis   acetaminophen (TYLENOL) tablet 325 mg   aspirin EC tablet 81 mg    Swallow whole.     simvastatin (ZOCOR) tablet 40 mg    for cholesterol     pantoprazole (PROTONIX) EC tablet 40 mg   polycarbophil (FIBERCON) tablet 1,250 mg   polyethylene glycol (MIRALAX / GLYCOLAX) packet 17 g   divalproex (DEPAKOTE SPRINKLE) capsule 125 mg   pramipexole (MIRAPEX) tablet 1 mg   cholecalciferol (VITAMIN D3) 25 MCG (1000 UNIT) tablet 5,000 Units   fluticasone (FLONASE) 50 MCG/ACT nasal spray 1 spray   DISCONTD: enoxaparin (LOVENOX) injection 40 mg   cefTRIAXone (ROCEPHIN) 2 g in sodium chloride 0.9 % 100 mL IVPB    Order Specific Question:   Antibiotic Indication:    Answer:   Osteomyelitis   hydrALAZINE (APRESOLINE) injection 5 mg   polyethylene glycol (MIRALAX / GLYCOLAX) packet 17 g   DISCONTD: vancomycin (VANCOREADY) IVPB 750 mg/150 mL    Order Specific Question:   Indication:    Answer:   Osteomyelitis   Oral care mouth rinse   melatonin tablet 6 mg   vancomycin (VANCOREADY) IVPB 1250 mg/250 mL    Order Specific Question:   Indication:    Answer:   Osteomyelitis    -I have reviewed the patients home medicines and have made adjustments as needed  Critical interventions none    Cardiac Monitoring: The patient was maintained on a cardiac monitor.  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   Sleep apnea    Uses O2 at 2L while sleeping; "can't tolerate CPAP OR BIPAP" * (04/17/2017)   Vertigo    Wears glasses       Dispostion: I considered admission for this patient, and due to osteomyelitis, patient require hospital admission     Final Clinical Impression(s) / ED Diagnoses Final diagnoses:  None     '@PCDICTATION'$ @  Teressa Lower, MD 08/11/22 [phone]

## 2022-08-11 NOTE — Anesthesia Preprocedure Evaluation (Addendum)
Anesthesia Evaluation  Patient identified by MRN, date of birth, ID band Patient awake    Reviewed: Allergy & Precautions, NPO status , Patient's Chart, lab work & pertinent test results  History of Anesthesia Complications (+) PONV and history of anesthetic complications  Airway Mallampati: II  TM Distance: >3 FB Neck ROM: Full    Dental  (+) Dental Advisory Given, Missing   Pulmonary sleep apnea, Continuous Positive Airway Pressure Ventilation and Oxygen sleep apnea , pneumonia, former smoker   Pulmonary exam normal breath sounds clear to auscultation       Cardiovascular Exercise Tolerance: Poor METS (balance issues): Normal cardiovascular exam Rhythm:Regular Rate:Normal     Neuro/Psych  Headaches, Seizures -, Well Controlled,  PSYCHIATRIC DISORDERS Anxiety    Dementia    GI/Hepatic Neg liver ROS,GERD  Controlled,,  Endo/Other  negative endocrine ROS    Renal/GU negative Renal ROS     Musculoskeletal  (+) Arthritis , Osteoarthritis,    Abdominal   Peds  Hematology negative hematology ROS (+)   Anesthesia Other Findings Spinal cord stimulator  Reproductive/Obstetrics                             Anesthesia Physical Anesthesia Plan  ASA: 4  Anesthesia Plan: General   Post-op Pain Management: Dilaudid IV   Induction: Intravenous  PONV Risk Score and Plan: 4 or greater and Ondansetron, Propofol infusion and TIVA  Airway Management Planned: Nasal Cannula and Natural Airway  Additional Equipment:   Intra-op Plan:   Post-operative Plan:   Informed Consent: I have reviewed the patients History and Physical, chart, labs and discussed the procedure including the risks, benefits and alternatives for the proposed anesthesia with the patient or authorized representative who has indicated his/her understanding and acceptance.     Dental advisory given  Plan Discussed with: CRNA and  Surgeon  Anesthesia Plan Comments: (In case of cardiac arrest, Family agreed to get brief resuscitation to the patient, do not want me to do prolonged resuscitation.)        Anesthesia Quick Evaluation

## 2022-08-11 NOTE — Progress Notes (Signed)
PROGRESS NOTE    Samuel Olson  TOI:[id number] DOB: 1941/06/09 DOA: 08/10/2022 PCP: Elyn Aquas    Brief Narrative:  81 year old male admitted to the hospital with osteomyelitis of left fifth toe and underlying left foot abscess.  Started on IV antibiotics.  Seen by podiatry with plans for operative management 11/10.   Assessment & Plan:   Principal Problem:   Acute osteomyelitis of left foot (HCC)   Acute left fifth metatarsal osteomyelitis Abscess left foot Cellulitis left lower extremity -Continue on IV antibiotics -Podiatry following, plans on operative intervention on 11/10  Hyperlipidemia -Continue statin  Mood disorder -Continue Depakote   DVT prophylaxis:   Code Status: DNR Family Communication: No family present Disposition Plan: Status is: Inpatient Remains inpatient appropriate because: Needs operative management of osteomyelitis and subsequent placement to skilled nursing facility     Consultants:  Podiatry  Procedures:    Antimicrobials:  Vancomycin Ceftriaxone   Subjective: He is lying in bed.  He denies any complaints at this time.  Objective: Vitals:   08/10/22 2259 08/10/22 2321 08/11/22 0435 08/11/22 1400  BP: (!) 144/98 138/67 109/64 128/66  Pulse: 72 75 (!) 59 62  Resp: '20 20  18  '$ Temp: 99.1 F (37.3 C) 97.9 F (36.6 C) (!) 97.5 F (36.4 C) 98 F (36.7 C)  TempSrc: Oral  Oral Oral  SpO2: 95% 95% 96% 95%  Weight:      Height:        Intake/Output Summary (Last 24 hours) at 08/11/2022 1859 Last data filed at 08/11/2022 1518 Gross per 24 hour  Intake 680 ml  Output --  Net 680 ml   Filed Weights   08/10/22 1446  Weight: 68 kg    Examination:  General exam: Appears calm and comfortable  Respiratory system: Clear to auscultation. Respiratory effort normal. Cardiovascular system: S1 & S2 heard, RRR. No JVD, murmurs, rubs, gallops or clicks. No pedal edema. Gastrointestinal system: Abdomen is nondistended, soft and  nontender. No organomegaly or masses felt. Normal bowel sounds heard. Central nervous system: Alert and oriented. No focal neurological deficits. Extremities: Left foot is wrapped in dressing Skin: No rashes, lesions or ulcers Psychiatry: Judgement and insight appear normal. Mood & affect appropriate.     Data Reviewed: I have personally reviewed following labs and imaging studies  CBC: Recent Labs  Lab 08/10/22 1720 08/11/22 0432  WBC 7.3 7.1  NEUTROABS 5.8  --   HGB 13.7 11.5*  HCT 42.9 36.6*  MCV 100.9* 101.1*  PLT 198 833   Basic Metabolic Panel: Recent Labs  Lab 08/10/22 1720 08/11/22 0432  NA 140 140  K 4.2 4.2  CL 102 105  CO2 30 31  GLUCOSE 102* 94  BUN 32* 31*  CREATININE 0.70 0.66  CALCIUM 9.0 8.5*   GFR: Estimated Creatinine Clearance: 67.7 mL/min (by C-G formula based on SCr of 0.66 mg/dL). Liver Function Tests: Recent Labs  Lab 08/10/22 1720  AST 26  ALT 20  ALKPHOS 88  BILITOT 0.4  PROT 7.9  ALBUMIN 3.8   No results for input(s): "LIPASE", "AMYLASE" in the last 168 hours. No results for input(s): "AMMONIA" in the last 168 hours. Coagulation Profile: No results for input(s): "INR", "PROTIME" in the last 168 hours. Cardiac Enzymes: No results for input(s): "CKTOTAL", "CKMB", "CKMBINDEX", "TROPONINI" in the last 168 hours. BNP (last 3 results) No results for input(s): "PROBNP" in the last 8760 hours. HbA1C: Recent Labs    08/10/22 1720  HGBA1C 5.4  CBG: No results for input(s): "GLUCAP" in the last 168 hours. Lipid Profile: No results for input(s): "CHOL", "HDL", "LDLCALC", "TRIG", "CHOLHDL", "LDLDIRECT" in the last 72 hours. Thyroid Function Tests: No results for input(s): "TSH", "T4TOTAL", "FREET4", "T3FREE", "THYROIDAB" in the last 72 hours. Anemia Panel: No results for input(s): "VITAMINB12", "FOLATE", "FERRITIN", "TIBC", "IRON", "RETICCTPCT" in the last 72 hours. Sepsis Labs: No results for input(s): "PROCALCITON",  "LATICACIDVEN" in the last 168 hours.  Recent Results (from the past 240 hour(s))  Culture, blood (Routine X 2) w Reflex to ID Panel     Status: None (Preliminary result)   Collection Time: 08/10/22  9:59 PM   Specimen: Right Antecubital; Blood  Result Value Ref Range Status   Specimen Description RIGHT ANTECUBITAL  Final   Special Requests   Final    BOTTLES DRAWN AEROBIC AND ANAEROBIC Blood Culture adequate volume   Culture   Final    NO GROWTH < 24 HOURS Performed at [facility], [address], [place], [place] [address]    Report Status PENDING  Incomplete  Culture, blood (Routine X 2) w Reflex to ID Panel     Status: None (Preliminary result)   Collection Time: 08/10/22 10:00 PM   Specimen: BLOOD RIGHT HAND  Result Value Ref Range Status   Specimen Description BLOOD RIGHT HAND  Final   Special Requests   Final    BOTTLES DRAWN AEROBIC AND ANAEROBIC Blood Culture adequate volume   Culture   Final    NO GROWTH < 24 HOURS Performed at [facility], [address] [place], [place] [address]    Report Status PENDING  Incomplete         Radiology Studies: DG Foot Complete Left  Result Date: 08/10/2022 CLINICAL DATA:  Foot infection, evaluate for osteo EXAM: LEFT FOOT - COMPLETE 3+ VIEW COMPARISON:  05/27/2022 FINDINGS: 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 New soft tissue gas with soft tissue swelling adjacent to the surgical margin. Osteomyelitis is presumed. No fracture or dislocation is seen. The joint spaces are preserved. 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 Adjacent new soft tissue swelling/gas, suggesting soft tissue infection, with presumed osteomyelitis at the surgical margin. Electronically Signed   By: Julian Hy M.D.   On: 08/10/2022 21:37        Scheduled Meds:  aspirin EC  81 mg Oral Daily   cholecalciferol  5,000 Units Oral q morning   divalproex  125 mg Oral BID   melatonin  6 mg Oral QHS   pantoprazole  40 mg Oral  Daily   polycarbophil  1,250 mg Oral Daily   polyethylene glycol  17 g Oral QHS   pramipexole  1 mg Oral TID   simvastatin  40 mg Oral QHS   Continuous Infusions:  cefTRIAXone (ROCEPHIN)  IV Stopped (08/11/22 0536)   vancomycin       LOS: 1 day    Time spent: 20mns    JKathie Dike MD [facility]   If 7PM-7AM, please contact night-coverage [email or web address]  08/11/2022, 6:59 PM

## 2022-08-11 NOTE — TOC Progression Note (Signed)
  Transition of Care [facility]) Screening Note   Patient Details  Name: Samuel Olson Date of Birth: Sep 06, 1941   Transition of Care [facility]) CM/SW Contact:    Shade Flood, LCSW Phone Number: 08/11/2022, 8:50 AM    Pt admitted from home. Received TOC consult for HH/DME needs. Review of chart shows plan is for surgery tomorrow. Anticipating pt will need HH vs SNF for rehab and possibly IV antibiotics.   TOC will follow up once further information is available.  Transition of Care Department [facility]) has reviewed patient and no TOC needs have been identified at this time. We will continue to monitor patient advancement through interdisciplinary progression rounds. If new patient transition needs arise, please place a TOC consult.

## 2022-08-11 NOTE — Evaluation (Signed)
Physical Therapy Evaluation Patient Details Name: [name] MRN: [id number] DOB: 11-07-1940 Today's Date: 08/11/2022  History of Present Illness  Samuel Olson  is a 81 y.o. male, with past medical history of GERD, mood disorder, restless leg syndrome, remote history of seizures (last in 1973), patient was sent by his podiatrist Dr. Caprice Beaver for evaluation of to myelitis, patient with nonhealing ulcer of his left foot, status post surgery at [facility] in August 2023, postoperative course was uneventful, surgical site has healed, he subsequently developed new ulcer behind the region of site, wound cultures were significant for MRSA, for which he was treated with doxycycline, RI was obtained which did show evidence of left foot osteomyelitis of left metatarsal area, with worsening wound, so patient was sent for further evaluation, patient/family denies any fever, chills, but overall reports generalized weakness and frailty.  -In ED no significant lab abnormalities, blood cultures were sent, and patient was started on broad-spectrum IV antibiotics and [facility] consulted to admit.   Clinical Impression  Patient demonstrates slow labored movement for sitting up at bedside with [facility] raised, fair/good return for using RW during transfer to chair and to/from commode in bathroom, maintaining PWB on LLE and limited for ambulation mostly due to c/o fatigue.  Patient tolerated sitting up in chair after therapy.  Patient will benefit from continued skilled physical therapy in hospital and recommended venue below to increase strength, balance, endurance for safe ADLs and gait.          Recommendations for follow up therapy are one component of a multi-disciplinary discharge planning process, led by the attending physician.  Recommendations may be updated based on patient status, additional functional criteria and insurance authorization.  Follow Up Recommendations Skilled nursing-short term rehab (<3  hours/day) Can patient physically be transported by private vehicle: Yes    Assistance Recommended at Discharge Set up Supervision/Assistance  Patient can return home with the following  A little help with walking and/or transfers;A little help with bathing/dressing/bathroom;Help with stairs or ramp for entrance;Assistance with cooking/housework    Equipment Recommendations None recommended by PT  Recommendations for Other Services       Functional Status Assessment Patient has had a recent decline in their functional status and demonstrates the ability to make significant improvements in function in a reasonable and predictable amount of time.     Precautions / Restrictions Precautions Precautions: Fall Restrictions Weight Bearing Restrictions: Yes LLE Weight Bearing: Partial weight bearing      Mobility  Bed Mobility Overal bed mobility: Needs Assistance Bed Mobility: Supine to Sit           General bed mobility comments: Mild labored movement to sit at EOB.    Transfers Overall transfer level: Needs assistance Equipment used: Rolling walker (2 wheels) Transfers: Sit to/from Stand, Bed to chair/wheelchair/BSC Sit to Stand: Min assist, Min guard   Step pivot transfers: Min guard, Min assist       General transfer comment: Min A from PT to boost from EOB. More min G assist once standing.    Ambulation/Gait Ambulation/Gait assistance: Min assist, Min guard Gait Distance (Feet): 15 Feet Assistive device: Rolling walker (2 wheels) Gait Pattern/deviations: Decreased step length - right, Decreased step length - left, Decreased stride length Gait velocity: decreased     General Gait Details: slow slightly labored cadence without lossof balance with fair/good return for PWB on LLE  Stairs            Wheelchair Mobility  Modified Rankin (Stroke Patients Only)       Balance Overall balance assessment: Needs assistance Sitting-balance support: Feet  supported, No upper extremity supported Sitting balance-Leahy Scale: Fair Sitting balance - Comments: fair to good seated at EOB   Standing balance support: Bilateral upper extremity supported, During functional activity, Reliant on assistive device for balance Standing balance-Leahy Scale: Fair Standing balance comment: using RW                             Pertinent Vitals/Pain Pain Assessment Pain Assessment: No/denies pain    Home Living Family/patient expects to be discharged to:: Private residence Living Arrangements: Spouse/significant other;Children Available Help at Discharge: Family Type of Home: Other(Comment) Home Access: Stairs to enter Entrance Stairs-Rails: Right;Left;Can reach both Entrance Stairs-Number of Steps: 2   Home Layout: One level Home Equipment: Conservation officer, nature (2 wheels);Cane - quad Additional Comments: reports spouse assists with meals, grandson "has trouble with his legs"    Prior Function Prior Level of Function : Needs assist;History of Falls (last six months)       Physical Assist : Mobility (physical);ADLs (physical) Mobility (physical): Bed mobility;Transfers;Gait;Stairs ADLs (physical): IADLs Mobility Comments: Pt reports using w/c mostly. RW used in bathroom and to transfer to w/c. ADLs Comments: Indepndent ADL's other than wife assisting with dressing L LE. Assited by family for IADL's.     Hand Dominance   Dominant Hand: Right    Extremity/Trunk Assessment   Upper Extremity Assessment Upper Extremity Assessment: Defer to OT evaluation    Lower Extremity Assessment Lower Extremity Assessment: Generalized weakness    Cervical / Trunk Assessment Cervical / Trunk Assessment: Kyphotic  Communication   Communication: HOH  Cognition Arousal/Alertness: Awake/alert Behavior During Therapy: WFL for tasks assessed/performed Overall Cognitive Status: Within Functional Limits for tasks assessed                                           General Comments      Exercises     Assessment/Plan    PT Assessment Patient needs continued PT services  PT Problem List Decreased strength;Decreased activity tolerance;Decreased balance;Decreased mobility       PT Treatment Interventions DME instruction;Gait training;Stair training;Functional mobility training;Therapeutic activities;Therapeutic exercise;Patient/family education;Balance training    PT Goals (Current goals can be found in the Care Plan section)  Acute Rehab PT Goals Patient Stated Goal: return home after rehab PT Goal Formulation: With patient Time For Goal Achievement: 08/25/22 Potential to Achieve Goals: Good    Frequency Min 3X/week     Co-evaluation PT/OT/SLP Co-Evaluation/Treatment: Yes Reason for Co-Treatment: To address functional/ADL transfers PT goals addressed during session: Mobility/safety with mobility;Balance;Proper use of DME OT goals addressed during session: ADL's and self-care       AM-PAC PT "6 Clicks" Mobility  Outcome Measure Help needed turning from your back to your side while in a flat bed without using bedrails?: None Help needed moving from lying on your back to sitting on the side of a flat bed without using bedrails?: A Little Help needed moving to and from a bed to a chair (including a wheelchair)?: A Little Help needed standing up from a chair using your arms (e.g., wheelchair or bedside chair)?: A Little Help needed to walk in hospital room?: A Little Help needed climbing 3-5 steps with a railing? : A Lot  6 Click Score: 18    End of Session   Activity Tolerance: Patient tolerated treatment well;Patient limited by fatigue Patient left: in chair;with call bell/phone within reach Nurse Communication: Mobility status PT Visit Diagnosis: Unsteadiness on feet (R26.81);Other abnormalities of gait and mobility (R26.89);Muscle weakness (generalized) (M62.81)    Time: 5001-6429 PT Time  Calculation (min) (ACUTE ONLY): 20 min   Charges:   PT Evaluation $PT Eval Moderate Complexity: 1 Mod PT Treatments $Therapeutic Activity: 8-22 mins        11:14 AM, 08/11/22 [name], MPT Physical Therapist with [facility] [phone] office [phone] mobile phone

## 2022-08-11 NOTE — Plan of Care (Signed)
  Problem: Acute Rehab OT Goals (only OT should resolve) Goal: Pt. Will Perform Grooming Flowsheets (Taken 08/11/2022 1005) Pt Will Perform Grooming:  with modified independence  standing Goal: Pt. Will Perform Lower Body Bathing Flowsheets (Taken 08/11/2022 1005) Pt Will Perform Lower Body Bathing:  with modified independence  sitting/lateral leans  with adaptive equipment Goal: Pt. Will Perform Lower Body Dressing Flowsheets (Taken 08/11/2022 1005) Pt Will Perform Lower Body Dressing:  with modified independence  with adaptive equipment  sitting/lateral leans Goal: Pt. Will Transfer To Toilet [place] (Taken 08/11/2022 1005) Pt Will Transfer to Toilet:  with modified independence  ambulating Goal: Pt/Caregiver Will Perform Home Exercise Program Flowsheets (Taken 08/11/2022 1005) Pt/caregiver will Perform Home Exercise Program:  Increased strength  Both right and left upper extremity  Independently  Darby Fleeman OT, MOT

## 2022-08-11 NOTE — Consult Note (Addendum)
PODIATRY CONSULT NOTE  REASON FOR CONSULTATION Osteomyelitis of the left foot.  HISTORY OF PRESENT ILLNESS Samuel Olson is a 81 y.o. male referred from my office to the [facility] ED on 08/10/2022 for admission, IV antibiotics and planned surgical debridement of his left foot.  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  His initial postoperative course was uneventful.  The surgical wound and ulceration both healed.  He subsequently developed a new wound posterior to the healed ulceration.  Local wound care was initiated.  The ulceration worsened.  A culture was obtained and he was empirically placed on doxycycline.  The culture revealed MRSA sensitive to doxycycline.  He returned on 08/03/2022 for a scheduled follow-up visit.  The depth of the ulceration had increased.  Labs and MRI were obtained.  The CBC did not reveal an elevated white count.  The ESR and CRP were within normal limits.  The MRI was consistent with osteomyelitis of the fifth metatarsal.   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   Sleep apnea    Uses O2 at 2L while sleeping; "can't tolerate CPAP OR BIPAP" * (04/17/2017)   Vertigo    Wears glasses    Scheduled Meds:  aspirin EC  81 mg Oral Daily   cholecalciferol  5,000 Units Oral q morning   divalproex  125 mg Oral BID   enoxaparin (LOVENOX) injection  40 mg Subcutaneous Q24H   melatonin  6 mg Oral QHS    pantoprazole  40 mg Oral Daily   polycarbophil  1,250 mg Oral Daily   polyethylene glycol  17 g Oral QHS   pramipexole  1 mg Oral TID   simvastatin  40 mg Oral QHS   Continuous Infusions:  cefTRIAXone (ROCEPHIN)  IV 2 g (08/11/22 0506)   vancomycin     PRN Meds:.acetaminophen, fluticasone, hydrALAZINE, mouth rinse, polyethylene glycol  Allergies  Allergen Reactions   Phenergan [Promethazine] Other (See Comments)    Cramps, shakes.   Augmentin [Amoxicillin-Pot Clavulanate] Other (See Comments)    UNKNOWN [SEE BELOW]  Has patient had a PCN reaction causing immediate rash, facial/tongue/throat swelling, SOB or lightheadedness with hypotension: Unknown Has patient had a PCN reaction causing severe rash involving mucus membranes or skin necrosis: Uknown PATIENT HAS HAD A PCN REACTION THAT REQUIRED HOSPITALIZATION: >> UNSPECIFIED REACTION WHILE HE WAS ALREADY ADMITTED TO A HOSPITAL.  Has patient had a PCN reaction occurring within the last 10 years: No    Oxycodone Other (See Comments)    UNSPECIFIED REACTION  TOLERATES APAP WITHOUT OXYCODONE   Percocet [Oxycodone-Acetaminophen] Other (See Comments)    Unknown. Tolerates acetaminophen alone.   Past Surgical History:  Procedure Laterality Date   BACK SURGERY     BILIARY STENT PLACEMENT  12/10/2018  Procedure: BILIARY STENT PLACEMENT;  Surgeon: Rush Landmark Telford Nab., MD;  Location: [place];  Service: Gastroenterology;;   BIOPSY  12/10/2018   Procedure: BIOPSY;  Surgeon: Irving Copas., MD;  Location: [facility] ENDOSCOPY;  Service: Gastroenterology;;   CARDIAC CATHETERIZATION     CHOLECYSTECTOMY     COLONOSCOPY     ENDOSCOPIC RETROGRADE CHOLANGIOPANCREATOGRAPHY (ERCP) WITH PROPOFOL N/A 12/10/2018   Procedure: ENDOSCOPIC RETROGRADE CHOLANGIOPANCREATOGRAPHY (ERCP) WITH PROPOFOL;  Surgeon: Irving Copas., MD;  Location: [place];  Service: Gastroenterology;  Laterality: N/A;  NEED  2 1/2 HOUR FOR CASE-JILL/PATTY    ESOPHAGOGASTRODUODENOSCOPY (EGD) WITH PROPOFOL N/A 12/10/2018   Procedure: ESOPHAGOGASTRODUODENOSCOPY (EGD) WITH PROPOFOL;  Surgeon: Rush Landmark Telford Nab., MD;  Location: [place];  Service: Gastroenterology;  Laterality: N/A;   FRACTURE SURGERY     Left plates and screws   OSTECTOMY METATARSAL Left    5th   pain stimulator   2012   pain stimulator   2012   removed 2012   PARTIAL KNEE ARTHROPLASTY Right 04/17/2017   Procedure: UNICOMPARTMENTAL KNEE;  Surgeon: Vickey Huger, MD;  Location: [place];  Service: Orthopedics;  Laterality: Right;   REMOVAL OF STONES  12/10/2018   Procedure: REMOVAL OF STONES;  Surgeon: Rush Landmark Telford Nab., MD;  Location: [place];  Service: Gastroenterology;;   REPLACEMENT UNICONDYLAR JOINT KNEE Right 04/17/2017   SPHINCTEROTOMY  12/10/2018   Procedure: Joan Mayans;  Surgeon: Mansouraty, Telford Nab., MD;  Location: [place];  Service: Gastroenterology;;   Bess Kinds CHOLANGIOSCOPY N/A 12/10/2018   Procedure: HCWCBJSE CHOLANGIOSCOPY;  Surgeon: Irving Copas., MD;  Location: [place];  Service: Gastroenterology;  Laterality: N/A;   TONSILLECTOMY     UVULOPALATOPHARYNGOPLASTY (UPPP)/TONSILLECTOMY/SEPTOPLASTY     VASECTOMY     Family History  Problem Relation Age of Onset   Heart disease Mother    Heart disease Father    Breast cancer Sister    Heart disease Brother    Colon cancer Neg Hx    Esophageal cancer Neg Hx    Inflammatory bowel disease Neg Hx    Liver disease Neg Hx    Pancreatic cancer Neg Hx    Rectal cancer Neg Hx    Stomach cancer Neg Hx     SOCIAL HISTORY  reports that he quit smoking about 57 years ago. His smoking use included cigarettes. He has never used smokeless tobacco. He reports that he does not drink alcohol and does not use drugs.  REVIEW OF SYSTEMS No N/V/F/C.  PHYSICAL EXAMINATION Vital signs in last 24 hours:   Temp:  [97.5 F (36.4 C)-99.2 F (37.3 C)] 97.5 F (36.4 C) (11/09 0435) Pulse  Rate:  [59-75] 59 (11/09 0435) Resp:  [18-20] 20 (11/08 2321) BP: (109-169)/(64-100) 109/64 (11/09 0435) SpO2:  [94 %-96 %] 96 % (11/09 0435) Weight:  [68 kg] 68 kg (11/08 1446)  GENERAL:  Sitting in chair at bedside with both feet resting on the ground. DRESSING:  Intact, L foot. INTEGUMENT:  Full thickness ulceration of the plantar lateral L foot.  Ulceration probes to bone.  Fluctuance present along the dorsolateral aspect of the left foot.  Seropurulence expressed from ulceration.   VASCULAR:  L DP pulse palpable.  L obscurred by edema.  Edema of LLE.  Some improvement in edema from yesterday. MUSCULOSKELETAL:  Calves soft, NT.    LAB/TEST RESULTS   Recent Labs    08/10/22 1720 08/11/22 0432  WBC 7.3 7.1  HGB 13.7 11.5*  HCT 42.9 36.6*  PLT 198 182  NA 140 140  K 4.2 4.2  CL 102 105  CO2 30 31  BUN 32* 31*  CREATININE 0.70 0.66  GLUCOSE 102* 94  CALCIUM 9.0 8.5*    Recent Results (from the past 240 hour(s))  Culture, blood (Routine X 2) w Reflex to ID Panel     Status: None (Preliminary result)   Collection Time: 08/10/22  9:59 PM   Specimen: Peripheral; Blood  Result Value Ref Range Status   Specimen Description RIGHT ANTECUBITAL  Final   Special Requests   Final    BOTTLES DRAWN AEROBIC AND ANAEROBIC Blood Culture adequate volume Performed at [facility], [address], [place], [place] [address]    Culture PENDING  Incomplete   Report Status PENDING  Incomplete  Culture, blood (Routine X 2) w Reflex to ID Panel     Status: None (Preliminary result)   Collection Time: 08/10/22 10:00 PM   Specimen: Peripheral; Blood  Result Value Ref Range Status   Specimen Description BLOOD RIGHT HAND  Final   Special Requests   Final    BOTTLES DRAWN AEROBIC AND ANAEROBIC Blood Culture adequate volume Performed at [facility], [address], [place], [place] [address]    Culture PENDING  Incomplete   Report Status PENDING  Incomplete     DG Foot Complete Left  Result  Date: 08/10/2022 CLINICAL DATA:  Foot infection, evaluate for osteo EXAM: LEFT FOOT - COMPLETE 3+ VIEW COMPARISON:  05/27/2022 FINDINGS: 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 New soft tissue gas with soft tissue swelling adjacent to the surgical margin. Osteomyelitis is presumed. No fracture or dislocation is seen. The joint spaces are preserved. 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 Adjacent new soft tissue swelling/gas, suggesting soft tissue infection, with presumed osteomyelitis at the surgical margin. Electronically Signed   By: Julian Hy M.D.   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 Abscess, L foot. Cellulitis, LLE.  PLAN Kerlix dressing applied to L foot. Encouraged patient to keep L foot elevated instead of resting on ground to avoid increase pressure to ulcer site. WB on L heel only. Continue IV antibiotics. Proceed with planned surgery tomorrow.  Surgery scheduled for 12:30. Will need I&D of L foot and partial 5th ray amputation of L foot. Will discuss with his wife (POA). Will obtain intraoperative cultures. NPO after midnight. D/C Lovenox until after surgery. Will need PICC line for approximately 6 week course of antibiotics. May need home KCI Wound VAC. Will need SNF placement at discharge.  [name] 08/11/2022, 12:06 PM

## 2022-08-11 NOTE — Evaluation (Signed)
Occupational Therapy Evaluation Patient Details Name: Samuel Olson MRN: [id number] DOB: Sep 22, 1941 Today's Date: 08/11/2022   History of Present Illness Samuel Olson  is a 81 y.o. male, with past medical history of GERD, mood disorder, restless leg syndrome, remote history of seizures (last in 1973), patient was sent by his podiatrist Dr. Caprice Beaver for evaluation of to myelitis, patient with nonhealing ulcer of his left foot, status post surgery at [facility] in August 2023, postoperative course was uneventful, surgical site has healed, he subsequently developed new ulcer behind the region of site, wound cultures were significant for MRSA, for which he was treated with doxycycline, RI was obtained which did show evidence of left foot osteomyelitis of left metatarsal area, with worsening wound, so patient was sent for further evaluation, patient/family denies any fever, chills, but overall reports generalized weakness and frailty.  -In ED no significant lab abnormalities, blood cultures were sent, and patient was started on broad-spectrum IV antibiotics and [facility] hospitalist consulted to admit. (Per MD)   Clinical Impression   Pt agreeable to OT and PT co-evaluation. Pt is mostly independent for ADL's at baseline. Today pt was generally weak with need for Min G to min A to boost from EOB for transfers with RW. Pt reports his wife is home 24/7 but that she has her own back problems. Pt needs assist for lower body dressing at this time due to L LE deficits. Pt is able to ambulate with more min G assist. SNF recommended if wife is unable to physically provide Min G to min A level of support. Pt will benefit from continued OT in the hospital and recommended venue below to increase strength, balance, and endurance for safe ADL's.        Recommendations for follow up therapy are one component of a multi-disciplinary discharge planning process, led by the attending physician.  Recommendations may be updated based  on patient status, additional functional criteria and insurance authorization.   Follow Up Recommendations  Skilled nursing-short term rehab (<3 hours/day)    Assistance Recommended at Discharge Intermittent Supervision/Assistance  Patient can return home with the following A little help with walking and/or transfers;A lot of help with bathing/dressing/bathroom;Assistance with cooking/housework;Assist for transportation;Help with stairs or ramp for entrance    Functional Status Assessment  Patient has had a recent decline in their functional status and demonstrates the ability to make significant improvements in function in a reasonable and predictable amount of time.  Equipment Recommendations  None recommended by OT    Recommendations for Other Services       Precautions / Restrictions Precautions Precautions: Fall Restrictions Weight Bearing Restrictions: Yes LLE Weight Bearing: Partial weight bearing      Mobility Bed Mobility Overal bed mobility: Needs Assistance Bed Mobility: Supine to Sit           General bed mobility comments: Mild labored movement to sit at EOB.    Transfers Overall transfer level: Needs assistance Equipment used: Rolling walker (2 wheels) Transfers: Sit to/from Stand, Bed to chair/wheelchair/BSC Sit to Stand: Min guard; Min assist     Step pivot transfers: Min guard, Min assist     General transfer comment: Min A from PT to boost from EOB. More min G assist once standing.      Balance Overall balance assessment: Needs assistance Sitting-balance support: No upper extremity supported, Feet supported Sitting balance-Leahy Scale: Fair Sitting balance - Comments: fair to good seated at EOB   Standing balance support: Bilateral upper  extremity supported, During functional activity, Reliant on assistive device for balance Standing balance-Leahy Scale: Fair Standing balance comment: using RW                           ADL  either performed or assessed with clinical judgement   ADL Overall ADL's : Needs assistance/impaired     Grooming: Set up;Sitting   Upper Body Bathing: Set up;Sitting   Lower Body Bathing: Moderate assistance;Sitting/lateral leans       Lower Body Dressing: Moderate assistance;Sitting/lateral leans Lower Body Dressing Details (indicate cue type and reason): Assist needed for LLE Toilet Transfer: Minimal assistance;Rolling walker (2 wheels);Ambulation Toilet Transfer Details (indicate cue type and reason): Chair to toilet and back with min A using RW.         Functional mobility during ADLs: Min guard;Minimal assistance;Rolling walker (2 wheels)       Vision Baseline Vision/History: 1 Wears glasses Ability to See in Adequate Light: 0 Adequate Patient Visual Report: No change from baseline Vision Assessment?: No apparent visual deficits                Pertinent Vitals/Pain Pain Assessment Pain Assessment: No/denies pain     Hand Dominance Right   Extremity/Trunk Assessment Upper Extremity Assessment Upper Extremity Assessment: Generalized weakness (Limited A/ROM likely at baseline from back issues.)   Lower Extremity Assessment Lower Extremity Assessment: Defer to PT evaluation   Cervical / Trunk Assessment Cervical / Trunk Assessment: Kyphotic   Communication Communication Communication: HOH   Cognition Arousal/Alertness: Awake/alert Behavior During Therapy: WFL for tasks assessed/performed Overall Cognitive Status: Within Functional Limits for tasks assessed                                                        Home Living Family/patient expects to be discharged to:: Private residence Living Arrangements: Spouse/significant other;Children Available Help at Discharge: Family Type of Home: Other(Comment) (Double Wide) Home Access: Stairs to enter Entrance Stairs-Number of Steps: 2 Entrance Stairs-Rails: Right;Left;Can reach  both Home Layout: One level     Bathroom Shower/Tub: Occupational psychologist: Standard Bathroom Accessibility: Yes How Accessible: Accessible via walker (Not accessible via w/c.) Home Equipment: Rolling Walker (2 wheels);Cane - quad          Prior Functioning/Environment Prior Level of Function : Needs assist;History of Falls (last six months)             Mobility Comments: Pt reports using w/c mostly. RW used in bathroom and to transfer to w/c. ADLs Comments: Indepndent ADL's other than wife assisting with dressing L LE. Assited by family for IADL's.        OT Problem List: Decreased strength;Decreased activity tolerance;Impaired balance (sitting and/or standing)      OT Treatment/Interventions: Self-care/ADL training;Therapeutic exercise;Therapeutic activities;Patient/family education;Balance training    OT Goals(Current goals can be found in the care plan section) Acute Rehab OT Goals Patient Stated Goal: Go to rehab. OT Goal Formulation: With patient Time For Goal Achievement: 08/25/22 Potential to Achieve Goals: Good  OT Frequency: Min 1X/week    Co-evaluation PT/OT/SLP Co-Evaluation/Treatment: Yes Reason for Co-Treatment: To address functional/ADL transfers   OT goals addressed during session: ADL's and self-care  End of Session Equipment Utilized During Treatment: Rolling walker (2 wheels) Nurse Communication: Other (comment);Mobility status (Notified pt wanted more breakfast.)  Activity Tolerance: Patient tolerated treatment well Patient left: in chair;with call bell/phone within reach  OT Visit Diagnosis: Unsteadiness on feet (R26.81);Other abnormalities of gait and mobility (R26.89);Muscle weakness (generalized) (M62.81);Repeated falls (R29.6);History of falling (Z91.81)                Time: 1674-2552 OT Time Calculation (min): 20 min Charges:  OT General Charges $OT Visit: 1 Visit OT Evaluation $OT Eval Low  Complexity: 1 Low  Felica Chargois OT, MOT  Larey Seat 08/11/2022, 10:01 AM

## 2022-08-11 NOTE — Progress Notes (Signed)
Just spoke with patients wife and she stated that she preferred for her husband to go to [employer] in [place] after he is discharged.

## 2022-08-12 ENCOUNTER — Inpatient Hospital Stay (HOSPITAL_COMMUNITY): Payer: Medicare Other | Admitting: Anesthesiology

## 2022-08-12 ENCOUNTER — Inpatient Hospital Stay (HOSPITAL_COMMUNITY): Payer: Medicare Other

## 2022-08-12 ENCOUNTER — Other Ambulatory Visit: Payer: Self-pay

## 2022-08-12 ENCOUNTER — Encounter (HOSPITAL_COMMUNITY)
Admission: EM | Disposition: A | Discharge status: Skilled Nursing Facility | Payer: Self-pay | Source: Home / Self Care | Attending: Internal Medicine

## 2022-08-12 ENCOUNTER — Encounter (HOSPITAL_COMMUNITY): Payer: Self-pay | Admitting: Internal Medicine

## 2022-08-12 DIAGNOSIS — G4733 Obstructive sleep apnea (adult) (pediatric): Secondary | ICD-10-CM

## 2022-08-12 DIAGNOSIS — M86172 Other acute osteomyelitis, left ankle and foot: Secondary | ICD-10-CM | POA: Diagnosis not present

## 2022-08-12 DIAGNOSIS — L97529 Non-pressure chronic ulcer of other part of left foot with unspecified severity: Secondary | ICD-10-CM | POA: Diagnosis not present

## 2022-08-12 DIAGNOSIS — L02612 Cutaneous abscess of left foot: Secondary | ICD-10-CM | POA: Diagnosis not present

## 2022-08-12 DIAGNOSIS — G2581 Restless legs syndrome: Secondary | ICD-10-CM | POA: Diagnosis not present

## 2022-08-12 DIAGNOSIS — M869 Osteomyelitis, unspecified: Secondary | ICD-10-CM | POA: Diagnosis not present

## 2022-08-12 DIAGNOSIS — E785 Hyperlipidemia, unspecified: Secondary | ICD-10-CM | POA: Diagnosis not present

## 2022-08-12 HISTORY — PX: AMPUTATION: SHX166

## 2022-08-12 HISTORY — PX: INCISION AND DRAINAGE: SHX5863

## 2022-08-12 LAB — CREATININE, SERUM
Creatinine, Ser: 0.57 mg/dL — ABNORMAL LOW (ref 0.61–1.24)
GFR, Estimated: >60 mL/min (ref >60)

## 2022-08-12 LAB — SURGICAL PCR SCREEN
MRSA, PCR: NEGATIVE
Staphylococcus aureus: NEGATIVE

## 2022-08-12 LAB — GLUCOSE, CAPILLARY: Glucose-Capillary: 82 mg/dL (ref 70–99)

## 2022-08-12 SURGERY — INCISION AND DRAINAGE
Anesthesia: General | Site: Foot | Laterality: Left

## 2022-08-12 MED ORDER — MORPHINE SULFATE (PF) 4 MG/ML IV SOLN
4.0000 mg | Freq: Once | INTRAVENOUS | Status: AC
  Administered 2022-08-12: 4 mg via INTRAVENOUS
  Filled 2022-08-12: qty 1

## 2022-08-12 MED ORDER — ORAL CARE MOUTH RINSE: 15.0000 mL | Freq: Once | OROMUCOSAL | Status: AC

## 2022-08-12 MED ORDER — ONDANSETRON HCL 4 MG/2ML IJ SOLN
INTRAMUSCULAR | Status: DC | PRN
  Administered 2022-08-12: 4 mg via INTRAVENOUS

## 2022-08-12 MED ORDER — FENTANYL CITRATE (PF) 100 MCG/2ML IJ SOLN
INTRAMUSCULAR | Status: AC
  Filled 2022-08-12: qty 2

## 2022-08-12 MED ORDER — CHLORHEXIDINE GLUCONATE 0.12 % MT SOLN
15.0000 mL | Freq: Once | OROMUCOSAL | Status: AC
  Administered 2022-08-12: 15 mL via OROMUCOSAL

## 2022-08-12 MED ORDER — PHENYLEPHRINE 80 MCG/ML (10ML) SYRINGE FOR IV PUSH (FOR BLOOD PRESSURE SUPPORT)
PREFILLED_SYRINGE | INTRAVENOUS | Status: AC
  Filled 2022-08-12: qty 20

## 2022-08-12 MED ORDER — 0.9 % SODIUM CHLORIDE (POUR BTL) OPTIME
TOPICAL | Status: DC | PRN
  Administered 2022-08-12: 1000 mL

## 2022-08-12 MED ORDER — HYDROMORPHONE HCL 1 MG/ML IJ SOLN: 0.2500 mg | INTRAMUSCULAR | Status: DC | PRN

## 2022-08-12 MED ORDER — MORPHINE SULFATE (PF) 4 MG/ML IV SOLN
2.0000 mg | INTRAVENOUS | Status: DC | PRN
  Administered 2022-08-12 – 2022-08-13 (×5): 2 mg via INTRAVENOUS
  Filled 2022-08-12 (×5): qty 1

## 2022-08-12 MED ORDER — ONDANSETRON HCL 4 MG/2ML IJ SOLN: 4.0000 mg | Freq: Once | INTRAMUSCULAR | Status: DC | PRN

## 2022-08-12 MED ORDER — BUPIVACAINE HCL (PF) 0.5 % IJ SOLN
INTRAMUSCULAR | Status: DC | PRN
  Administered 2022-08-12: 20 mL

## 2022-08-12 MED ORDER — PHENYLEPHRINE 80 MCG/ML (10ML) SYRINGE FOR IV PUSH (FOR BLOOD PRESSURE SUPPORT)
PREFILLED_SYRINGE | INTRAVENOUS | Status: AC
  Filled 2022-08-12: qty 10

## 2022-08-12 MED ORDER — MUPIROCIN 2 % EX OINT
1.0000 | TOPICAL_OINTMENT | Freq: Two times a day (BID) | CUTANEOUS | Status: AC
  Administered 2022-08-12 – 2022-08-16 (×10): 1 via NASAL
  Filled 2022-08-12 (×3): qty 22

## 2022-08-12 MED ORDER — PHENYLEPHRINE 80 MCG/ML (10ML) SYRINGE FOR IV PUSH (FOR BLOOD PRESSURE SUPPORT)
PREFILLED_SYRINGE | INTRAVENOUS | Status: DC | PRN
  Administered 2022-08-12 (×7): 80 ug via INTRAVENOUS

## 2022-08-12 MED ORDER — ENOXAPARIN SODIUM 40 MG/0.4ML IJ SOSY
40.0000 mg | PREFILLED_SYRINGE | INTRAMUSCULAR | Status: DC
  Administered 2022-08-13 – 2022-08-18 (×6): 40 mg via SUBCUTANEOUS
  Filled 2022-08-12 (×6): qty 0.4

## 2022-08-12 MED ORDER — LIDOCAINE HCL (CARDIAC) PF 100 MG/5ML IV SOSY
PREFILLED_SYRINGE | INTRAVENOUS | Status: DC | PRN
  Administered 2022-08-12: 40 mg via INTRAVENOUS

## 2022-08-12 MED ORDER — LIDOCAINE HCL (PF) 1 % IJ SOLN
INTRAMUSCULAR | Status: AC
  Filled 2022-08-12: qty 30

## 2022-08-12 MED ORDER — BUPIVACAINE HCL (PF) 0.5 % IJ SOLN
INTRAMUSCULAR | Status: AC
  Filled 2022-08-12: qty 30

## 2022-08-12 MED ORDER — ONDANSETRON HCL 4 MG/2ML IJ SOLN
INTRAMUSCULAR | Status: AC
  Filled 2022-08-12: qty 2

## 2022-08-12 MED ORDER — LIDOCAINE HCL (PF) 2 % IJ SOLN
INTRAMUSCULAR | Status: AC
  Filled 2022-08-12: qty 5

## 2022-08-12 MED ORDER — POLYETHYLENE GLYCOL 3350 17 G PO PACK
17.0000 g | PACK | Freq: Three times a day (TID) | ORAL | Status: DC
  Administered 2022-08-13 – 2022-08-18 (×16): 17 g via ORAL
  Filled 2022-08-12 (×17): qty 1

## 2022-08-12 MED ORDER — FENTANYL CITRATE PF 50 MCG/ML IJ SOSY
50.0000 ug | PREFILLED_SYRINGE | Freq: Once | INTRAMUSCULAR | Status: AC
  Administered 2022-08-12: 50 ug via INTRAVENOUS

## 2022-08-12 MED ORDER — PROPOFOL 500 MG/50ML IV EMUL
INTRAVENOUS | Status: DC | PRN
  Administered 2022-08-12: 100 ug/kg/min via INTRAVENOUS

## 2022-08-12 MED ORDER — CHLORHEXIDINE GLUCONATE 0.12 % MT SOLN
OROMUCOSAL | Status: AC
  Filled 2022-08-12: qty 15

## 2022-08-12 MED ORDER — FENTANYL CITRATE (PF) 100 MCG/2ML IJ SOLN
INTRAMUSCULAR | Status: DC | PRN
  Administered 2022-08-12: 50 ug via INTRAVENOUS

## 2022-08-12 MED ORDER — PROPOFOL 10 MG/ML IV BOLUS
INTRAVENOUS | Status: DC | PRN
  Administered 2022-08-12: 30 mg via INTRAVENOUS

## 2022-08-12 MED ORDER — TAMSULOSIN HCL 0.4 MG PO CAPS
0.4000 mg | ORAL_CAPSULE | Freq: Every day | ORAL | Status: DC
  Administered 2022-08-12 – 2022-08-13 (×2): 0.4 mg via ORAL
  Filled 2022-08-12 (×2): qty 1

## 2022-08-12 MED ORDER — FENTANYL CITRATE PF 50 MCG/ML IJ SOSY
PREFILLED_SYRINGE | INTRAMUSCULAR | Status: AC
  Filled 2022-08-12: qty 1

## 2022-08-12 MED ORDER — LACTATED RINGERS IV SOLN: INTRAVENOUS | Status: DC

## 2022-08-12 MED ORDER — HYDROCODONE-ACETAMINOPHEN 10-325 MG PO TABS
1.0000 | ORAL_TABLET | Freq: Four times a day (QID) | ORAL | Status: DC | PRN
  Administered 2022-08-12 – 2022-08-18 (×6): 1 via ORAL
  Filled 2022-08-12 (×8): qty 1

## 2022-08-12 SURGICAL SUPPLY — 50 items
APL SKNCLS STERI-STRIP NONHPOA (GAUZE/BANDAGES/DRESSINGS) ×1
BANDAGE ESMARK 4X12 BL STRL LF (DISPOSABLE) ×1 IMPLANT
BENZOIN TINCTURE PRP APPL 2/3 (GAUZE/BANDAGES/DRESSINGS) ×1 IMPLANT
BLADE AVERAGE 25X9 (BLADE) ×1 IMPLANT
BLADE SURG 15 STRL LF DISP TIS (BLADE) ×1 IMPLANT
BLADE SURG 15 STRL SS (BLADE) ×1
BNDG CMPR STD VLCR NS LF 5.8X4 (GAUZE/BANDAGES/DRESSINGS) ×1
BNDG CONFORM 2 STRL LF (GAUZE/BANDAGES/DRESSINGS) ×1 IMPLANT
BNDG ELASTIC 4X5.8 VLCR NS LF (GAUZE/BANDAGES/DRESSINGS) ×1 IMPLANT
BNDG ELASTIC 4X5.8 VLCR STR LF (GAUZE/BANDAGES/DRESSINGS) IMPLANT
BNDG ESMARK 4X12 BLUE STRL LF (DISPOSABLE) ×1 IMPLANT
BNDG GAUZE DERMACEA FLUFF 4 (GAUZE/BANDAGES/DRESSINGS) IMPLANT
BNDG GAUZE ELAST 4 BULKY (GAUZE/BANDAGES/DRESSINGS) ×1 IMPLANT
BNDG GZE DERMACEA 4 6PLY (GAUZE/BANDAGES/DRESSINGS) ×1
BOOT STEPPER DURA LG (SOFTGOODS) IMPLANT
BOOT STEPPER DURA MED (SOFTGOODS) IMPLANT
BOOT STEPPER DURA SM (SOFTGOODS) IMPLANT
BOOT STEPPER DURA XLG (SOFTGOODS) IMPLANT
CLOTH BEACON ORANGE TIMEOUT ST (SAFETY) ×1 IMPLANT
COVER LIGHT HANDLE STERIS (MISCELLANEOUS) ×2 IMPLANT
CUFF TOURN SGL QUICK 18X4 (TOURNIQUET CUFF) ×1 IMPLANT
DRAPE OEC MINIVIEW 54X84 (DRAPES) ×1 IMPLANT
DRSG ADAPTIC 3X8 NADH LF (GAUZE/BANDAGES/DRESSINGS) ×1 IMPLANT
DRSG VAC ATS MED SENSATRAC (GAUZE/BANDAGES/DRESSINGS) IMPLANT
ELECT REM PT RETURN 9FT ADLT (ELECTROSURGICAL) ×1 IMPLANT
ELECTRODE REM PT RTRN 9FT ADLT (ELECTROSURGICAL) ×1 IMPLANT
GAUZE SPONGE 4X4 12PLY STRL (GAUZE/BANDAGES/DRESSINGS) ×1 IMPLANT
GAUZE SPONGE 4X4 12PLY STRL LF (GAUZE/BANDAGES/DRESSINGS) IMPLANT
GLOVE BIO SURGEON STRL SZ7.5 (GLOVE) ×1 IMPLANT
GLOVE BIOGEL PI IND STRL 7.0 (GLOVE) ×2 IMPLANT
GOWN STRL REUS W/TWL LRG LVL3 (GOWN DISPOSABLE) ×2 IMPLANT
KIT TURNOVER KIT A (KITS) ×1 IMPLANT
MARKER SKIN DUAL TIP RULER LAB (MISCELLANEOUS) ×1 IMPLANT
NDL HYPO 27GX1-1/4 (NEEDLE) ×4 IMPLANT
NEEDLE HYPO 27GX1-1/4 (NEEDLE) ×4 IMPLANT
NS IRRIG 1000ML POUR BTL (IV SOLUTION) ×1 IMPLANT
PACK BASIC LIMB (CUSTOM PROCEDURE TRAY) ×1 IMPLANT
PAD ARMBOARD 7.5X6 YLW CONV (MISCELLANEOUS) ×1 IMPLANT
PAD TELFA 3X4 1S STER (GAUZE/BANDAGES/DRESSINGS) ×1 IMPLANT
SET BASIN LINEN APH (SET/KITS/TRAYS/PACK) ×1 IMPLANT
SOL PREP PROV IODINE SCRUB 4OZ (MISCELLANEOUS) ×1 IMPLANT
SPONGE T-LAP 18X18 ~~LOC~~+RFID (SPONGE) ×1 IMPLANT
STRIP CLOSURE SKIN 1/2X4 (GAUZE/BANDAGES/DRESSINGS) ×2 IMPLANT
SUT MON AB 2-0 CT1 36 (SUTURE) ×1 IMPLANT
SUT PROLENE 3 0 PS 2 (SUTURE) IMPLANT
SUT VIC AB 4-0 PS2 27 (SUTURE) ×1 IMPLANT
SUT VICRYL AB 3-0 FS1 BRD 27IN (SUTURE) ×1 IMPLANT
SWAB CULTURE ESWAB REG 1ML (MISCELLANEOUS) IMPLANT
SYR CONTROL 10ML LL (SYRINGE) ×2 IMPLANT
TOWEL OR 17X26 4PK STRL BLUE (TOWEL DISPOSABLE) ×1 IMPLANT

## 2022-08-12 NOTE — Progress Notes (Signed)
Pt off unit for procedure.

## 2022-08-12 NOTE — Progress Notes (Signed)
PODIATRY PROGRESS NOTE  SUBJECTIVE Samuel Olson is scheduled for OR today.  NPO since midnight.  Wife and daughter-in-law at bedside.  MEDICATIONS Scheduled Meds:  [MAR Hold] aspirin EC  81 mg Oral Daily   chlorhexidine       [MAR Hold] cholecalciferol  5,000 Units Oral q morning   [MAR Hold] divalproex  125 mg Oral BID   fentaNYL       [MAR Hold] melatonin  6 mg Oral QHS   [MAR Hold] mupirocin ointment  1 Application Nasal BID   [MAR Hold] pantoprazole  40 mg Oral Daily   [MAR Hold] polycarbophil  1,250 mg Oral Daily   [MAR Hold] polyethylene glycol  17 g Oral QHS   [MAR Hold] pramipexole  1 mg Oral TID   [MAR Hold] simvastatin  40 mg Oral QHS   Continuous Infusions:  [MAR Hold] cefTRIAXone (ROCEPHIN)  IV 2 g (08/12/22 0610)   lactated ringers 50 mL/hr at 08/12/22 1114   [MAR Hold] vancomycin 1,250 mg (08/11/22 2153)   PRN Meds:.[MAR Hold] acetaminophen, chlorhexidine, fentaNYL, [MAR Hold] fluticasone, [MAR Hold] hydrALAZINE, [MAR Hold] mouth rinse, [MAR Hold] polyethylene glycol  OBJECTIVE Vital signs in last 24 hours:   Temp:  [97.4 F (36.3 C)-98 F (36.7 C)] 97.7 F (36.5 C) (11/10 1044) Pulse Rate:  [62-67] 67 (11/10 1130) Resp:  [13-18] 13 (11/10 1130) BP: (128-155)/(66-93) 155/93 (11/10 1044) SpO2:  [92 %-96 %] 92 % (11/10 1130)  DRESSING:  Intact with no strikethrough.  LAB/TEST RESULTS  Recent Labs    08/10/22 1720 08/11/22 0432 08/12/22 0420  WBC 7.3 7.1  --   HGB 13.7 11.5*  --   HCT 42.9 36.6*  --   PLT 198 182  --   NA 140 140  --   K 4.2 4.2  --   CL 102 105  --   CO2 30 31  --   BUN 32* 31*  --   CREATININE 0.70 0.66 0.57*  GLUCOSE 102* 94  --   CALCIUM 9.0 8.5*  --     Recent Results (from the past 240 hour(s))  Culture, blood (Routine X 2) w Reflex to ID Panel     Status: None (Preliminary result)   Collection Time: 08/10/22  9:59 PM   Specimen: Right Antecubital; Blood  Result Value Ref Range Status   Specimen Description RIGHT  ANTECUBITAL  Final   Special Requests   Final    BOTTLES DRAWN AEROBIC AND ANAEROBIC Blood Culture adequate volume   Culture   Final    NO GROWTH 2 DAYS Performed at [facility], [address], [place], [place] [address]    Report Status PENDING  Incomplete  Culture, blood (Routine X 2) w Reflex to ID Panel     Status: None (Preliminary result)   Collection Time: 08/10/22 10:00 PM   Specimen: BLOOD RIGHT HAND  Result Value Ref Range Status   Specimen Description BLOOD RIGHT HAND  Final   Special Requests   Final    BOTTLES DRAWN AEROBIC AND ANAEROBIC Blood Culture adequate volume   Culture   Final    NO GROWTH 2 DAYS Performed at [facility], [address], [place], [place] [address]    Report Status PENDING  Incomplete  Surgical PCR screen     Status: None   Collection Time: 08/12/22  6:16 AM   Specimen: Nasal Mucosa; Nasal Swab  Result Value Ref Range Status   MRSA, PCR NEGATIVE NEGATIVE Final  Staphylococcus aureus NEGATIVE NEGATIVE Final    Comment: (NOTE) The Xpert SA Assay (FDA approved for NASAL specimens in patients 22 years of age and older), is one component of a comprehensive surveillance program. It is not intended to diagnose infection nor to guide or monitor treatment. Performed at [facility], [address] [place], [place] [address]      DG Foot Complete Left  Result Date: 08/10/2022 CLINICAL DATA:  Foot infection, evaluate for osteo EXAM: LEFT FOOT - COMPLETE 3+ VIEW COMPARISON:  05/27/2022 FINDINGS: 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 New soft tissue gas with soft tissue swelling adjacent to the surgical margin. Osteomyelitis is presumed. No fracture or dislocation is seen. The joint spaces are preserved. 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 Adjacent new soft tissue swelling/gas, suggesting soft tissue infection, with presumed osteomyelitis at the surgical margin. Electronically Signed   By: Samuel Olson M.D.   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 Abscess, L foot. Cellulitis, LLE.   PLAN Proceed with I&D of L foot and partial 5th ray amputation of L foot as planned.  Samuel Olson 08/12/2022, 11:51 AM

## 2022-08-12 NOTE — Brief Op Note (Signed)
BRIEF 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, left foot Ulceration, left foot  POSTOPERATIVE DIAGNOSIS Same  PROCEDURE Incision and drainage of abscess, left foot 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, left foot Excisional debridement of ulceration, left foot Application of KCI Wound VAC, left foot  ANESTHESIA Monitor Anesthesia Care   HEMOSTASIS Pneumatic ankle tourniquet set at 250 mmHg  ESTIMATED BLOOD LOSS <25 cc  MATERIALS USED KCI Wound VAC  INJECTABLES 0.5% Marcaine plain  PATHOLOGY Swab for aerobic and anaerobic culture and sensitvity 5th toe and portion of 5th metatarsal for pathology Wafer of bone from 5th metatarsal for margins  COMPLICATIONS None

## 2022-08-12 NOTE — Progress Notes (Signed)
Patient Information  Patient Name Samuel Olson, Samuel Olson ([id number]) Legal Sex Male DOB [birth year]  Room Bed  APPO NONE    Patient Demographics  Address [place] [address] Phone [phone] (Home) [phone] (Mobile) *Preferred*    Basic Information  Date Of Birth March 26, 1941 Gender Identity Male Race White or Caucasian Ethnic Group Not Hispanic or Latino Preferred Language English    Patient Contacts  Name Relation Home Work Mobile  Samuel Olson Spouse [phone]  [phone]  Samuel Olson, Samuel Olson   [phone]  Samuel Olson,Samuel Olson Daughter   [phone]  Samuel Olson,Samuel Olson Granddaughter   [phone]    Documents on File   Status Date Received Description  Documents for the Patient  [place] E-Signature HIPAA Notice of Privacy Signed 12/10/18   [place] E-Signature HIPAA Notice of Privacy Spanish     [place] HIPAA NOTICE OF PRIVACY - Scanned Not Received    Driver's License Not Received    Insurance Card Not Received    Other Photo ID Not Received    Advance Directives/Living Will/HCPOA/POA Not Received    Advance Directives/Living Will/HCPOA/POA  04/19/17   AMB Correspondence Received 01/07/19 PROGRESS NOTES [facility]  HIM ROI Authorization Received 04/10/19 powershared to [facility]  AMB Correspondence Received 05/09/19 LETTER [place]  Insurance Card Received 06/25/19 bcbs  Insurance Card Received 11/13/21 Sherre Poot - [id number]  VVS Policy for Pain - E Signature     HIM ROI Authorization  75/10/25   Driver's License Received 85/27/78 NCDL EXP 08/25/2022  Release of Information Received 12/08/21 DPR ALL [facility] 2023  HIM Release of Information Output  02/07/19 Requested records  Patient Photo   Photo of Patient  HIM Release of Information Output  12/01/21 Requested records  HIM Release of Information Output  08/12/22 Requested records  Documents for the Encounter  Waiver of MSE E-sig Signed 08/10/22    AOB  (Assignment of Insurance Benefits) Not Received    E-signature AOB Signed 08/10/22   MEDICARE RIGHTS Not Received    E-signature Medicare Rights     Annual Exam - E-Signature PCP     Photos     Photos     Photos     Photos     Photos     Photos     Photos     Photos     E-signature Medicare Rights Signed 08/10/22   Correspondence Received 08/11/22   Correspondence Received 08/11/22   ED Patient Billing Extract   ED PB Billing Extract  HIM Release of Information Output  08/11/22 Document (08/11/2022 12:52 PM EST)  HIM Release of Information Output  08/11/22 Document (08/11/2022  3:24 PM EST)    Admission Information   Current Information  Attending Provider Admitting Provider Admission Type Admission Status  Kathie Dike, MD [place], Silver Huguenin, MD Emergency Confirmed Admission        Admission Date/Time Discharge Date Hospital Service Auth/Cert Status  24/23/53  2027  Acute Care Incomplete        Hospital Area Unit Room/Bed   [facility] AP-PERIOP APPO/NONE             Admission  Complaint  wound check left foot    Hospital Account  Name Acct ID Class Status Primary Coverage  Samuel Olson, Samuel Olson [id number] Inpatient Open BLUE [place]        Guarantor Account (for Hospital Account [id number])  Name Relation to Pt Service Area Active? Acct Type  Nadyne Coombes Self [facility]SA Yes Personal/Family  Address Phone    [address] [place], [place] [address] [phone])          Coverage Information (for Hospital Account [id number])  F/O Payor/Plan Precert #  [facility] SHIELD MEDICARE/BCBS MEDICARE   Subscriber Subscriber #  Samuel Olson, Samuel Olson [id number]  Address Phone  [address] [place], [place] [address] [phone]         Care Everywhere ID:  [id number]

## 2022-08-12 NOTE — Anesthesia Postprocedure Evaluation (Signed)
Anesthesia Post Note  Patient: Samuel Olson  Procedure(s) Performed: INCISION AND DRAINAGE LEFT FOOT (Left: Foot) PARTIAL 5TH AMPUTATION RAY LEFT FOOT (Left: Foot)  Patient location during evaluation: Phase II Anesthesia Type: General Level of consciousness: awake and alert and oriented Pain management: pain level controlled Vital Signs Assessment: post-procedure vital signs reviewed and stable Respiratory status: spontaneous breathing, nonlabored ventilation and respiratory function stable Cardiovascular status: blood pressure returned to baseline and stable Postop Assessment: no apparent nausea or vomiting Anesthetic complications: no  No notable events documented.   Last Vitals:  Vitals:   08/12/22 1444 08/12/22 1445  BP: (!) 134/92 (!) 134/92  Pulse:    Resp: (!) 9 13  Temp:    SpO2:  100%    Last Pain:  Vitals:   08/12/22 1445  TempSrc:   PainSc: 0-No pain                 Anastassia Noack C Irfan Veal

## 2022-08-12 NOTE — Op Note (Signed)
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  DESCRIPTION OF THE PROCEDURE:  The patient was brought to the operating room and placed on the operative table in the supine position.  A pneumatic ankle tourniquet was applied to the operative extremity.  A timeout was performed.  Following sedation, the surgical site was anesthetized with 0.5% Marcaine plain.  The foot was then prepped, scrubbed, and draped in the usual sterile technique.  The foot was elevated, exsanguinated and the pneumatic ankle tourniquet inflated to 250 mmHg.  A second timeout was performed.  Attention was directed to the left foot.  2 converging semielliptical incisions were made encompassing the fifth toe circumferentially at the MPJ.  The incision was continued over the dorsal aspect of the fifth metatarsal.  The soft tissue was bluntly dissected using a hemostat.  Approximately 5 cc of purulence was  encountered.  The surgical wound was irrigated with sterile saline.  The fifth toe was disarticulated at the level of the MPJ, removed and passed from the operative field.  The surgical wound was debrided free of areas of fat necrosis and fibrous tissue was removed using #15 blade and a Metzenbaum scissors.  The surgical wound was irrigated with saline.  The distal aspect of the remaining portion of the fifth metatarsal bone was irregular and soft.  Attention was directed proximally to firm normal-appearing bone.  Using a power bone saw a through and through osteotomy was performed.  The fifth metatarsal was freed of all soft tissue attachments, removed and passed from the operative field.  The fifth toe and distal portion of the fifth metatarsal bone was submitted to pathology as 1 specimen.  The surgical wound was irrigated with copious amounts of sterile saline.  Soft tissue culture was obtained for aerobic and anaerobic culture and sensitivity.  A second through and through osteotomy was performed and the remaining portion of the fifth metatarsal bone creating a small wafer which was removed and sent to pathology as a separate specimen for margins.  The surgical wound was irrigated with copious amounts of sterile irrigant.  The subcutaneous structure was reapproximated using 4-0 Vicryl.  The skin was reapproximated using 4-0 Prolene in a horizontal mattress and simple suture technique.  Attention was directed to the plantar aspect of the left foot where a full-thickness ulceration was encountered measuring 1.0 x 1.1 cm.  Using a sterile #15 blade, an excisional full-thickness debridement was performed.  Skin and subcutaneous tissue was removed and passed from the operative field.  The surgical wound was irrigated with copious amounts of  sterile irrigant.  Following debridement, the ulceration measured 1.5 x 1.5 x 2.0 cm.  A KCI wound VAC was applied to the ulceration and found operational at 125 mmHg of  continuous pressure.  The pneumatic ankle tourniquet was deflated and a prompt hyperemic response was noted to all remaining digits of the left foot.  A sterile dressing was applied to the left foot.   The patient tolerated the procedure well.  The patient was then transferred to PACU with vital signs stable and vascular status intact to all toes of the operative foot.

## 2022-08-12 NOTE — Progress Notes (Signed)
New IV obtain this shift. Patient complaint of wanting to be "knocked out" Melatonin was given as order. Patient later had complaint of back pain Tylenol given and MD notified Morphine was later given.

## 2022-08-12 NOTE — Transfer of Care (Signed)
Immediate Anesthesia Transfer of Care Note  Patient: Samuel Olson  Procedure(s) Performed: INCISION AND DRAINAGE LEFT FOOT (Left: Foot) PARTIAL 5TH AMPUTATION RAY LEFT FOOT (Left: Foot)  Patient Location: PACU  Anesthesia Type:MAC  Level of Consciousness: drowsy and patient cooperative  Airway & Oxygen Therapy: Patient Spontanous Breathing and Patient connected to nasal cannula oxygen  Post-op Assessment: Report given to RN and Post -op Vital signs reviewed and stable  Post vital signs: Reviewed and stable  Last Vitals:  Vitals Value Taken Time  BP    Temp    Pulse    Resp    SpO2      Last Pain:  Vitals:   08/12/22 1115  TempSrc:   PainSc: 5          Complications: No notable events documented.

## 2022-08-12 NOTE — Care Management Important Message (Signed)
Important Message  Patient Details  Name: Samuel Olson MRN: [id number] Date of Birth: 1941/08/20   Medicare Important Message Given:  Yes     Tommy Medal 08/12/2022, 10:28 AM

## 2022-08-12 NOTE — Progress Notes (Signed)
OT Cancellation Note  Patient Details Name: Samuel Olson MRN: [id number] DOB: 1941/06/25   Cancelled Treatment:       Pt was off the floor and in surgery when OT attempted to see pt for OT session. Will f/u and attempt to see later as time and schedule allows.   Frederic Jericho, OTR/L  08/12/2022, 11:44 AM

## 2022-08-12 NOTE — Progress Notes (Signed)
PROGRESS NOTE    Samuel Olson  QZR:[id number] DOB: [birth year] DOA: 08/10/2022 PCP: Elyn Aquas    Brief Narrative:  81 year old male admitted to the hospital with osteomyelitis of left fifth toe and underlying left foot abscess.  Started on IV antibiotics.  Seen by podiatry with plans for operative management 11/10.   Assessment & Plan:   Principal Problem:   Acute osteomyelitis of left foot (HCC)   Acute left fifth metatarsal osteomyelitis Abscess left foot Cellulitis left lower extremity -Continue on IV antibiotics -Podiatry following -Status post operative management -Follow-up intraoperative cultures -Can discuss antibiotic regimen with infectious disease once operative culture results are available -Continue wound VAC.  Postop care per podiatry.  Urinary retention -Bladder scan shows residual urinary volume of almost 900 cc -We will attempt in and out catheterization -If he has recurrent retention, may need Foley catheter -Start on Flomax  Constipation -Start on frequent MiraLAX doses -Can consider enema if no bowel movement  Hyperlipidemia -Continue statin  Mood disorder -Continue Depakote   DVT prophylaxis: Resume Lovenox on 11/11  Code Status: DNR Family Communication: No family present Disposition Plan: Status is: Inpatient Remains inpatient appropriate because: Needs operative management of osteomyelitis and subsequent placement to skilled nursing facility     Consultants:  Podiatry  Procedures:  Incision and drainage of abscess, left foot 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  Pain appears to be controlled.  Describes some difficulty urinating earlier today.  Reported lower abdominal pain when trying to pass urine.  Has not had a bowel movement in 2  days.  Objective: Vitals:   08/10/22 2259 08/10/22 2321 08/11/22 0435 08/11/22 1400  BP: (!) 144/98 138/67 109/64 128/66  Pulse: 72 75 (!) 59 62  Resp: '20 20  18  '$ Temp: 99.1 F (37.3 C) 97.9 F (36.6 C) (!) 97.5 F (36.4 C) 98 F (36.7 C)  TempSrc: Oral  Oral Oral  SpO2: 95% 95% 96% 95%  Weight:      Height:        Intake/Output Summary (Last 24 hours) at 08/11/2022 1859 Last data filed at 08/11/2022 1518 Gross per 24 hour  Intake 680 ml  Output --  Net 680 ml   Filed Weights   08/10/22 1446  Weight: 68 kg    Examination:  General exam: Appears calm and comfortable  Respiratory system: Clear to auscultation. Respiratory effort normal. Cardiovascular system: S1 & S2 heard, RRR. No JVD, murmurs, rubs, gallops or clicks. No pedal edema. Gastrointestinal system: Abdomen is distended, soft and nontender. No organomegaly or masses felt. Normal bowel sounds heard. Central nervous system: Alert and oriented. No focal neurological deficits. Extremities: Left foot is wrapped in dressing with wound VAC Skin: No rashes, lesions or ulcers Psychiatry: Judgement and insight appear normal. Mood & affect appropriate.     Data Reviewed: I have personally reviewed following labs and imaging studies  CBC: Recent Labs  Lab 08/10/22 1720 08/11/22 0432  WBC 7.3 7.1  NEUTROABS 5.8  --   HGB 13.7 11.5*  HCT 42.9 36.6*  MCV 100.9* 101.1*  PLT 198 354   Basic Metabolic Panel: Recent Labs  Lab 08/10/22 1720 08/11/22 0432  NA 140 140  K 4.2 4.2  CL 102 105  CO2 30 31  GLUCOSE 102* 94  BUN 32* 31*  CREATININE 0.70 0.66  CALCIUM 9.0 8.5*   GFR: Estimated Creatinine Clearance: 67.7 mL/min (by C-G formula based on SCr of 0.66 mg/dL). Liver Function Tests: Recent Labs  Lab 08/10/22 1720  AST 26  ALT 20  ALKPHOS 88  BILITOT 0.4  PROT 7.9  ALBUMIN 3.8   No results for input(s): "LIPASE", "AMYLASE" in the last 168 hours. No results for input(s): "AMMONIA" in the last  168 hours. Coagulation Profile: No results for input(s): "INR", "PROTIME" in the last 168 hours. Cardiac Enzymes: No results for input(s): "CKTOTAL", "CKMB", "CKMBINDEX", "TROPONINI" in the last 168 hours. BNP (last 3 results) No results for input(s): "PROBNP" in the last 8760 hours. HbA1C: Recent Labs    08/10/22 1720  HGBA1C 5.4   CBG: No results for input(s): "GLUCAP" in the last 168 hours. Lipid Profile: No results for input(s): "CHOL", "HDL", "LDLCALC", "TRIG", "CHOLHDL", "LDLDIRECT" in the last 72 hours. Thyroid Function Tests: No results for input(s): "TSH", "T4TOTAL", "FREET4", "T3FREE", "THYROIDAB" in the last 72 hours. Anemia Panel: No results for input(s): "VITAMINB12", "FOLATE", "FERRITIN", "TIBC", "IRON", "RETICCTPCT" in the last 72 hours. Sepsis Labs: No results for input(s): "PROCALCITON", "LATICACIDVEN" in the last 168 hours.  Recent Results (from the past 240 hour(s))  Culture, blood (Routine X 2) w Reflex to ID Panel     Status: None (Preliminary result)   Collection Time: 08/10/22  9:59 PM   Specimen: Right Antecubital; Blood  Result Value Ref Range Status   Specimen Description RIGHT ANTECUBITAL  Final   Special Requests   Final    BOTTLES DRAWN AEROBIC AND ANAEROBIC Blood Culture adequate volume   Culture   Final    NO GROWTH < 24 HOURS Performed at [facility], [address], [place], [place] [address]    Report Status PENDING  Incomplete  Culture, blood (Routine X 2) w Reflex to ID Panel     Status: None (Preliminary result)   Collection Time: 08/10/22 10:00 PM   Specimen: BLOOD RIGHT HAND  Result Value Ref Range Status   Specimen Description BLOOD RIGHT HAND  Final   Special Requests   Final    BOTTLES DRAWN AEROBIC AND ANAEROBIC Blood Culture adequate volume   Culture   Final    NO GROWTH < 24 HOURS Performed at [facility], [address] [place], [place] [address]    Report Status PENDING  Incomplete         Radiology Studies: DG  Foot Complete Left  Result Date: 08/10/2022 CLINICAL DATA:  Foot infection, evaluate for osteo EXAM: LEFT FOOT - COMPLETE 3+ VIEW COMPARISON:  05/27/2022 FINDINGS: 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 New soft tissue gas with soft tissue swelling adjacent to the surgical margin. Osteomyelitis is presumed. No fracture or dislocation is seen. The joint spaces are preserved. 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 Adjacent new soft tissue swelling/gas, suggesting soft tissue infection, with presumed osteomyelitis at the surgical margin. Electronically Signed   By: Julian Hy M.D.   On: 08/10/2022 21:37        Scheduled Meds:  aspirin EC  81 mg Oral Daily   cholecalciferol  5,000 Units Oral q morning   divalproex  125 mg Oral BID   melatonin  6 mg Oral QHS   pantoprazole  40 mg Oral Daily   polycarbophil  1,250 mg Oral Daily   polyethylene glycol  17 g Oral QHS   pramipexole  1 mg Oral TID   simvastatin  40 mg Oral QHS  Continuous Infusions:  cefTRIAXone (ROCEPHIN)  IV Stopped (08/11/22 0536)   vancomycin       LOS: 1 day    Time spent: 26mns    JKathie Dike MD [facility]   If 7PM-7AM, please contact night-coverage [email or web address]  08/11/2022, 6:59 PM

## 2022-08-12 NOTE — TOC Initial Note (Signed)
Transition of Care [facility]) - Initial/Assessment Note    Patient Details  Name: Samuel Olson MRN: [id number] Date of Birth: 11-19-1940  [facility] CM/SW Contact:    Salome Arnt, [place] Phone Number: 08/12/2022, 11:22 AM  Clinical Narrative:  Pt admitted due to acute osteomyelitis of left foot. Assessment completed with pt's son, Farooq as pt currently in surgery. Pt lives with his wife. PT evaluated pt and recommend SNF. Pt will also likely require 6 weeks IV antibiotics. [place] agrees with SNF and requests [facility]. Will send referral and initiate SNF authorization.                   Expected Discharge Plan: Skilled Nursing Facility Barriers to Discharge: Continued Medical Work up   Patient Goals and CMS Choice Patient states their goals for this hospitalization and ongoing recovery are:: short term rehab   Choice offered to / list presented to : Adult Children  Expected Discharge Plan and Services Expected Discharge Plan: [place] In-house Referral: Clinical Social Work   Post Acute Care Choice: [place] Living arrangements for the past 2 months: [place]                                      Prior Living Arrangements/Services Living arrangements for the past 2 months: Single Family Home Lives with:: Spouse Patient language and need for interpreter reviewed:: Yes Do you feel safe going back to the place where you live?: Yes          Current home services: DME (cane, walker, wheelchair, O2) Criminal Activity/Legal Involvement Pertinent to Current Situation/Hospitalization: No - Comment as needed  Activities of Daily Living Home Assistive Devices/Equipment: Wheelchair, Environmental consultant (specify type), Eyeglasses, Oxygen (rolling walker) ADL Screening (condition at time of admission) Patient's cognitive ability adequate to safely complete daily activities?: Yes Is the patient deaf or have difficulty hearing?:  Yes Does the patient have difficulty seeing, even when wearing glasses/contacts?: No Does the patient have difficulty concentrating, remembering, or making decisions?: Yes Patient able to express need for assistance with ADLs?: Yes Does the patient have difficulty dressing or bathing?: No Independently performs ADLs?: Yes (appropriate for developmental age) Does the patient have difficulty walking or climbing stairs?: Yes Weakness of Legs: Both Weakness of Arms/Hands: None  Permission Sought/Granted                  Emotional Assessment   Attitude/Demeanor/Rapport: Unable to Assess     Alcohol / Substance Use: Not Applicable Psych Involvement: No (comment)  Admission diagnosis:  Acute osteomyelitis of left foot ([place]) [J18.841] Patient Active Problem List   Diagnosis Date Noted   Acute osteomyelitis of left foot ([place]) 08/10/2022   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[place]) 06/11/2022   Abnormal barium swallow 06/11/2022   Oropharyngeal dysphagia 06/11/2022   Unintentional weight loss 06/11/2022   Fecal impaction ([place]) 06/11/2022   Other constipation 06/11/2022   Skin rash 06/11/2022   Anorexia 06/11/2022   Frequent falls 03/14/2022   Demand ischemia 11/14/2021   RLS (restless legs syndrome) 11/14/2021   HLD (hyperlipidemia) 11/14/2021   CAP (community acquired pneumonia) 11/13/2021   Sleep apnea 11/13/2021   Chronic constipation 07/17/2021   Dyspepsia 07/17/2021   Gastroesophageal reflux disease 03/22/2020   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   Generalized abdominal  pain 06/28/2019   Abdominal cramping 06/28/2019   History of colonic polyps 06/28/2019   Biliary obstruction 11/30/2018   Choledocholithiasis 11/30/2018   Deep vein thrombosis (DVT) of right lower extremity ([place]) 11/30/2018   Seizures ([place]) 11/29/2018   Anxiety 11/29/2018   Elevated LFTs 11/29/2018   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

## 2022-08-13 DIAGNOSIS — M86172 Other acute osteomyelitis, left ankle and foot: Secondary | ICD-10-CM | POA: Diagnosis not present

## 2022-08-13 DIAGNOSIS — G2581 Restless legs syndrome: Secondary | ICD-10-CM | POA: Diagnosis not present

## 2022-08-13 DIAGNOSIS — E785 Hyperlipidemia, unspecified: Secondary | ICD-10-CM | POA: Diagnosis not present

## 2022-08-13 LAB — CBC
HCT: 36.8 % — ABNORMAL LOW (ref 39.0–52.0)
Hemoglobin: 11.9 g/dL — ABNORMAL LOW (ref 13.0–17.0)
MCH: 32.2 pg (ref 26.0–34.0)
MCHC: 32.3 g/dL (ref 30.0–36.0)
MCV: 99.7 fL (ref 80.0–100.0)
Platelets: 162 10*3/uL (ref 150–400)
RBC: 3.69 MIL/uL — ABNORMAL LOW (ref 4.22–5.81)
RDW: 13 % (ref 11.5–15.5)
WBC: 6.6 10*3/uL (ref 4.0–10.5)
nRBC: 0 % (ref 0.0–0.2)

## 2022-08-13 LAB — BASIC METABOLIC PANEL
Anion gap: 6 (ref 5–15)
BUN: 19 mg/dL (ref 8–23)
CO2: 31 mmol/L (ref 22–32)
Calcium: 8.5 mg/dL — ABNORMAL LOW (ref 8.9–10.3)
Chloride: 102 mmol/L (ref 98–111)
Creatinine, Ser: 0.62 mg/dL (ref 0.61–1.24)
GFR, Estimated: >60 mL/min (ref >60)
Glucose, Bld: 96 mg/dL (ref 70–99)
Potassium: 4.3 mmol/L (ref 3.5–5.1)
Sodium: 139 mmol/L (ref 135–145)

## 2022-08-13 MED ORDER — SORBITOL 70 % SOLN
960.0000 mL | TOPICAL_OIL | Freq: Once | ORAL | Status: AC
  Administered 2022-08-13: 960 mL via RECTAL
  Filled 2022-08-13: qty 240

## 2022-08-13 MED ORDER — ONDANSETRON HCL 4 MG/2ML IJ SOLN
4.0000 mg | Freq: Once | INTRAMUSCULAR | Status: AC
  Administered 2022-08-13: 4 mg via INTRAVENOUS
  Filled 2022-08-13: qty 2

## 2022-08-13 MED ORDER — MORPHINE SULFATE (PF) 2 MG/ML IV SOLN
2.0000 mg | INTRAVENOUS | Status: DC | PRN
  Administered 2022-08-14: 2 mg via INTRAVENOUS
  Filled 2022-08-13: qty 1

## 2022-08-13 NOTE — Progress Notes (Signed)
PROGRESS NOTE    Samuel Olson  EHM:[id number] DOB: 1941-01-03 DOA: 08/10/2022 PCP: Elyn Aquas    Brief Narrative:  81 year old male admitted to the hospital with osteomyelitis of left fifth toe and underlying left foot abscess.  Started on IV antibiotics.  Seen by podiatry with plans for operative management 11/10.   Assessment & Plan:   Principal Problem:   Acute osteomyelitis of left foot (HCC)   Acute left fifth metatarsal osteomyelitis Abscess left foot Cellulitis left lower extremity -Continue on IV antibiotics -Podiatry following -Status post operative management -Follow-up intraoperative cultures -Can discuss antibiotic regimen with infectious disease once operative culture results are available -Continue wound VAC.  Postop care per podiatry.  Urinary retention -Bladder scan shows residual urinary volume of almost 900 cc -Foley catheter was placed for retention -He was started on Flomax, but will likely have to discontinue due to soft blood pressures  Constipation -Start on frequent MiraLAX doses -Since has not had any bowel movement, will give an enema  Hyperlipidemia -Continue statin  Mood disorder -Continue Depakote   DVT prophylaxis: enoxaparin (LOVENOX) injection 40 mg Start: 08/13/22 1000Resume Lovenox on 11/11  Code Status: DNR Family Communication: Grandson is present at the bedside Disposition Plan: Status is: Inpatient Remains inpatient appropriate because: Needs operative management of osteomyelitis and subsequent placement to skilled nursing facility     Consultants:  Podiatry  Procedures:  Incision and drainage of abscess, left foot 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  Foley catheter placed overnight for urinary  retention  Objective: Vitals:   08/13/22 0416 08/13/22 0855 08/13/22 1416 08/13/22 1425  BP: 113/66  (!) 82/56 (!) 94/58  Pulse: 71  78 78  Resp:   18   Temp:   98.5 F (36.9 C)   TempSrc:      SpO2:  (!) 3% 95%   Weight:      Height:        Intake/Output Summary (Last 24 hours) at 08/13/2022 2016 Last data filed at 08/13/2022 1700 Gross per 24 hour  Intake 720 ml  Output 1100 ml  Net -380 ml   Filed Weights   08/10/22 1446  Weight: 68 kg    Examination:  General exam: Appears calm and comfortable  Respiratory system: Clear to auscultation. Respiratory effort normal. Cardiovascular system: S1 & S2 heard, RRR. No JVD, murmurs, rubs, gallops or clicks. No pedal edema. Gastrointestinal system: Abdomen is distended, soft and nontender. No organomegaly or masses felt. Normal bowel sounds heard. Central nervous system: Alert and oriented. No focal neurological deficits. Extremities: Left foot is wrapped in dressing with wound VAC Skin: No rashes, lesions or ulcers Psychiatry: Judgement and insight appear normal. Mood & affect appropriate.     Data Reviewed: I have personally reviewed following labs and imaging studies  CBC: Recent Labs  Lab 08/10/22 1720 08/11/22 0432 08/13/22 0530  WBC 7.3 7.1 6.6  NEUTROABS 5.8  --   --   HGB 13.7 11.5* 11.9*  HCT 42.9 36.6* 36.8*  MCV 100.9* 101.1* 99.7  PLT 198 182 366   Basic Metabolic Panel: Recent Labs  Lab 08/10/22 1720 08/11/22 0432 08/12/22 0420 08/13/22 0530  NA 140 140  --  139  K 4.2 4.2  --  4.3  CL 102 105  --  102  CO2 30 31  --  31  GLUCOSE 102* 94  --  96  BUN 32* 31*  --  19  CREATININE 0.70 0.66 0.57* 0.62  CALCIUM 9.0 8.5*  --  8.5*   GFR: Estimated Creatinine Clearance: 67.7 mL/min (by C-G formula based on SCr of 0.62 mg/dL). Liver Function Tests: Recent Labs  Lab 08/10/22 1720  AST 26  ALT 20  ALKPHOS 88  BILITOT 0.4  PROT 7.9  ALBUMIN 3.8   No results for input(s): "LIPASE",  "AMYLASE" in the last 168 hours. No results for input(s): "AMMONIA" in the last 168 hours. Coagulation Profile: No results for input(s): "INR", "PROTIME" in the last 168 hours. Cardiac Enzymes: No results for input(s): "CKTOTAL", "CKMB", "CKMBINDEX", "TROPONINI" in the last 168 hours. BNP (last 3 results) No results for input(s): "PROBNP" in the last 8760 hours. HbA1C: No results for input(s): "HGBA1C" in the last 72 hours.  CBG: Recent Labs  Lab 08/12/22 1444  GLUCAP 82   Lipid Profile: No results for input(s): "CHOL", "HDL", "LDLCALC", "TRIG", "CHOLHDL", "LDLDIRECT" in the last 72 hours. Thyroid Function Tests: No results for input(s): "TSH", "T4TOTAL", "FREET4", "T3FREE", "THYROIDAB" in the last 72 hours. Anemia Panel: No results for input(s): "VITAMINB12", "FOLATE", "FERRITIN", "TIBC", "IRON", "RETICCTPCT" in the last 72 hours. Sepsis Labs: No results for input(s): "PROCALCITON", "LATICACIDVEN" in the last 168 hours.  Recent Results (from the past 240 hour(s))  Culture, blood (Routine X 2) w Reflex to ID Panel     Status: None (Preliminary result)   Collection Time: 08/10/22  9:59 PM   Specimen: Right Antecubital; Blood  Result Value Ref Range Status   Specimen Description RIGHT ANTECUBITAL  Final   Special Requests   Final    BOTTLES DRAWN AEROBIC AND ANAEROBIC Blood Culture adequate volume   Culture   Final    NO GROWTH 3 DAYS Performed at [facility], [address] [place], [place] [address]    Report Status PENDING  Incomplete  Culture, blood (Routine X 2) w Reflex to ID Panel     Status: None (Preliminary result)   Collection Time: 08/10/22 10:00 PM   Specimen: BLOOD RIGHT HAND  Result Value Ref Range Status   Specimen Description BLOOD RIGHT HAND  Final   Special Requests   Final    BOTTLES DRAWN AEROBIC AND ANAEROBIC Blood Culture adequate volume   Culture   Final    NO GROWTH 3 DAYS Performed at [facility], [address] [place], [place] [address]     Report Status PENDING  Incomplete  Surgical PCR screen     Status: None   Collection Time: 08/12/22  6:16 AM   Specimen: Nasal Mucosa; Nasal Swab  Result Value Ref Range Status   MRSA, PCR NEGATIVE NEGATIVE Final   Staphylococcus aureus NEGATIVE NEGATIVE Final    Comment: (NOTE) The Xpert SA Assay (FDA approved for NASAL specimens in patients 60 years of age and older), is one component of a comprehensive surveillance program. It is not intended to diagnose infection nor to guide or monitor treatment. Performed at [facility], [address] [place], [place] [address]   Aerobic/Anaerobic Culture w Gram Stain (surgical/deep wound)     Status: None (Preliminary result)   Collection Time: 08/12/22  1:42 PM   Specimen: Soft Tissue, Other  Result Value Ref Range Status   Specimen Description   Final    TISSUE SOFT Performed at [facility], [address], [place], [place] [address]    Special Requests  Final    FOOT LEFT INCISION AND DRAINAGE Performed at [facility], [address], [place], [place] [address]    Gram Stain NO WBC SEEN RARE GRAM POSITIVE COCCI IN PAIRS   Final   Culture   Final    RARE STAPHYLOCOCCUS EPIDERMIDIS RARE PSEUDOMONAS AERUGINOSA CULTURE REINCUBATED FOR BETTER GROWTH Performed at [facility], [place] [address], [place], [place] [address]    Report Status PENDING  Incomplete         Radiology Studies: DG Foot Complete Left  Result Date: 08/12/2022 CLINICAL DATA:  Status post amputation. EXAM: LEFT FOOT - COMPLETE 3+ VIEW COMPARISON:  Left foot x-rays dated August 10, 2022. FINDINGS: Interval fifth ray amputation with overlying wound VAC in place. No acute fracture or dislocation. Osteopenia. IMPRESSION: 1. Interval fifth ray amputation. Electronically Signed   By: Titus Dubin M.D.   On: 08/12/2022 16:45   DG Foot 2 Views Left  Result Date: 08/12/2022 CLINICAL DATA:  Osteomyelitis. EXAM: LEFT FOOT - 2 VIEW COMPARISON:  Left foot x-rays  dated August 10, 2022. FLUOROSCOPY TIME:  Radiation Exposure Index (as provided by the fluoroscopic device): 0.06 mGy Kerma C-arm fluoroscopic images were obtained intraoperatively and submitted for post operative interpretation. FINDINGS: Single intraoperative AP view of the midfoot demonstrates further amputation of the fifth metatarsal shaft. IMPRESSION: 1. Intraoperative fluoroscopic guidance for fifth metatarsal amputation. Electronically Signed   By: Titus Dubin M.D.   On: 08/12/2022 16:43   DG C-Arm 1-60 Min-No Report  Result Date: 08/12/2022 Fluoroscopy was utilized by the requesting physician.  No radiographic interpretation.        Scheduled Meds:  aspirin EC  81 mg Oral Daily   cholecalciferol  5,000 Units Oral q morning   divalproex  125 mg Oral BID   enoxaparin (LOVENOX) injection  40 mg Subcutaneous Q24H   melatonin  6 mg Oral QHS   mupirocin ointment  1 Application Nasal BID   pantoprazole  40 mg Oral Daily   polycarbophil  1,250 mg Oral Daily   polyethylene glycol  17 g Oral TID   pramipexole  1 mg Oral TID   simvastatin  40 mg Oral QHS   sorbitol, milk of mag, mineral oil, glycerin (SMOG) enema  960 mL Rectal Once   Continuous Infusions:  cefTRIAXone (ROCEPHIN)  IV 2 g (08/13/22 0503)   vancomycin 1,250 mg (08/12/22 2040)     LOS: 3 days    Time spent: 37mns    JKathie Dike MD [facility]   If 7PM-7AM, please contact night-coverage [email or web address]  08/13/2022, 8:16 PM

## 2022-08-13 NOTE — Progress Notes (Signed)
   08/13/22 1416  Vitals  Temp 98.5 F (36.9 C)  BP (!) 82/56  MAP (mmHg) (!) 64  BP Method Automatic  Pulse Rate 78  Pulse Rate Source Monitor  Resp 18  MEWS COLOR  MEWS Score Color Green  Oxygen Therapy  SpO2 95 %  MEWS Score  MEWS Temp 0  MEWS Systolic 1  MEWS Pulse 0  MEWS RR 0  MEWS LOC 0  MEWS Score 1   MD Memon notified.

## 2022-08-13 NOTE — Progress Notes (Signed)
PODIATRY PROGRESS NOTE  SUBJECTIVE Samuel Olson POD 1 following I&D, partial 5th ray amputation and debridement of ulceration of L foot.  Doing well.  Eating lunch.  Pain controlled.  No nausea, vomiting, fever or chills.  MEDICATIONS Scheduled Meds:  aspirin EC  81 mg Oral Daily   cholecalciferol  5,000 Units Oral q morning   divalproex  125 mg Oral BID   enoxaparin (LOVENOX) injection  40 mg Subcutaneous Q24H   melatonin  6 mg Oral QHS   mupirocin ointment  1 Application Nasal BID   pantoprazole  40 mg Oral Daily   polycarbophil  1,250 mg Oral Daily   polyethylene glycol  17 g Oral TID   pramipexole  1 mg Oral TID   simvastatin  40 mg Oral QHS   tamsulosin  0.4 mg Oral Daily   Continuous Infusions:  cefTRIAXone (ROCEPHIN)  IV 2 g (08/13/22 0503)   vancomycin 1,250 mg (08/12/22 2040)   PRN Meds:.fluticasone, hydrALAZINE, HYDROcodone-acetaminophen, morphine (PF), mouth rinse, polyethylene glycol  OBJECTIVE Vital signs in last 24 hours:   Temp:  [96.8 F (36 C)-98.4 F (36.9 C)] 98.4 F (36.9 C) (11/11 0321) Pulse Rate:  [64-80] 71 (11/11 0416) Resp:  [9-20] 13 (11/11 0321) BP: (101-138)/(59-99) 113/66 (11/11 0416) SpO2:  [3 %-100 %] 3 % (11/11 0855)  GENERAL:  Alert, NAD.  L foot elevated on pillow. EQUIPMENT:  Wound VAC in place and operational at 125 mmHg continuous pressure. DRESSING:  Intact without strikethrough. VASCULAR:  Capillary refill brisk to remaining digits of L foot.  LAB/TEST RESULTS  Recent Labs    08/11/22 0432 08/12/22 0420 08/13/22 0530  WBC 7.1  --  6.6  HGB 11.5*  --  11.9*  HCT 36.6*  --  36.8*  PLT 182  --  162  NA 140  --  139  K 4.2  --  4.3  CL 105  --  102  CO2 31  --  31  BUN 31*  --  19  CREATININE 0.66 0.57* 0.62  GLUCOSE 94  --  96  CALCIUM 8.5*  --  8.5*    Recent Results (from the past 240 hour(s))  Culture, blood (Routine X 2) w Reflex to ID Panel     Status: None (Preliminary result)   Collection Time: 08/10/22  9:59 PM    Specimen: Right Antecubital; Blood  Result Value Ref Range Status   Specimen Description RIGHT ANTECUBITAL  Final   Special Requests   Final    BOTTLES DRAWN AEROBIC AND ANAEROBIC Blood Culture adequate volume   Culture   Final    NO GROWTH 3 DAYS Performed at [facility], [address] [place], [place] [address]    Report Status PENDING  Incomplete  Culture, blood (Routine X 2) w Reflex to ID Panel     Status: None (Preliminary result)   Collection Time: 08/10/22 10:00 PM   Specimen: BLOOD RIGHT HAND  Result Value Ref Range Status   Specimen Description BLOOD RIGHT HAND  Final   Special Requests   Final    BOTTLES DRAWN AEROBIC AND ANAEROBIC Blood Culture adequate volume   Culture   Final    NO GROWTH 3 DAYS Performed at [facility], [address] [place], [place] [address]    Report Status PENDING  Incomplete  Surgical PCR screen     Status: None   Collection Time: 08/12/22  6:16 AM   Specimen: Nasal Mucosa; Nasal Swab  Result Value  Ref Range Status   MRSA, PCR NEGATIVE NEGATIVE Final   Staphylococcus aureus NEGATIVE NEGATIVE Final    Comment: (NOTE) The Xpert SA Assay (FDA approved for NASAL specimens in patients 38 years of age and older), is one component of a comprehensive surveillance program. It is not intended to diagnose infection nor to guide or monitor treatment. Performed at [facility], [address], [place], [place] [address]   Aerobic/Anaerobic Culture w Gram Stain (surgical/deep wound)     Status: None (Preliminary result)   Collection Time: 08/12/22  1:42 PM   Specimen: Soft Tissue, Other  Result Value Ref Range Status   Specimen Description   Final    TISSUE SOFT Performed at [facility], [address], [place], [place] [address]    Special Requests   Final    FOOT LEFT INCISION AND DRAINAGE Performed at [facility], [address], [place], [place] [address]    Gram Stain NO WBC SEEN RARE GRAM POSITIVE COCCI IN PAIRS   Final   Culture    Final    CULTURE REINCUBATED FOR BETTER GROWTH Performed at [facility], [place] [address], [place], [place] [address]    Report Status PENDING  Incomplete     DG Foot Complete Left  Result Date: 08/12/2022 CLINICAL DATA:  Status post amputation. EXAM: LEFT FOOT - COMPLETE 3+ VIEW COMPARISON:  Left foot x-rays dated August 10, 2022. FINDINGS: Interval fifth ray amputation with overlying wound VAC in place. No acute fracture or dislocation. Osteopenia. IMPRESSION: 1. Interval fifth ray amputation. Electronically Signed   By: Titus Dubin M.D.   On: 08/12/2022 16:45   DG Foot 2 Views Left  Result Date: 08/12/2022 CLINICAL DATA:  Osteomyelitis. EXAM: LEFT FOOT - 2 VIEW COMPARISON:  Left foot x-rays dated August 10, 2022. FLUOROSCOPY TIME:  Radiation Exposure Index (as provided by the fluoroscopic device): 0.06 mGy Kerma C-arm fluoroscopic images were obtained intraoperatively and submitted for post operative interpretation. FINDINGS: Single intraoperative AP view of the midfoot demonstrates further amputation of the fifth metatarsal shaft. IMPRESSION: 1. Intraoperative fluoroscopic guidance for fifth metatarsal amputation. Electronically Signed   By: Titus Dubin M.D.   On: 08/12/2022 16:43   DG C-Arm 1-60 Min-No Report  Result Date: 08/12/2022 Fluoroscopy was utilized by the requesting physician.  No radiographic interpretation.    ASSESSMENT POD 1 S/P I&D, partial 5th ray amputation and debridement of ulceration of L foot.  PLAN Continue Wound VAC. Will change VAC tomorrow. Continue IV antibiotics. Will need PICC line for 6 week of IV antibiotics. NWB L foot.  [name] 08/13/2022, 12:32 PM

## 2022-08-13 NOTE — Consult Note (Signed)
Pharmacy Antibiotic Note  Samuel Olson is a 81 y.o. male admitted on 08/10/2022 with  osteomyelitis of foot .  Patient with nonhealing ulcer of his left foot, status post surgery at [facility] in August 2023. History of MRSA treated with doxycycline. Pharmacy has been consulted for vancomycin dosing.  Patient post I&D of left foot, partial 5th ray amputation of L foot 11/10. Will need PICC line for approximately 6 week course of antibiotics. Will plan VT 11/12 prior to 2200 dose.May have more information on wound cx then as well.  Plan: Continue vancomycin '1250mg'$  IV q24h F/U cxs and clinical progress Monitor v/s , labs and levels as indicated  Height: '5\' 7"'$  (170.2 cm) Weight: 68 kg (150 lb) IBW/kg (Calculated) : 66.1  Temp (24hrs), Avg:97.7 F (36.5 C), Min:96.8 F (36 C), Max:98.4 F (36.9 C)  Recent Labs  Lab 08/10/22 1720 08/11/22 0432 08/12/22 0420 08/13/22 0530  WBC 7.3 7.1  --  6.6  CREATININE 0.70 0.66 0.57* 0.62     Estimated Creatinine Clearance: 67.7 mL/min (by C-G formula based on SCr of 0.62 mg/dL).    Allergies  Allergen Reactions   Phenergan [Promethazine] Other (See Comments)    Cramps, shakes.   Augmentin [Amoxicillin-Pot Clavulanate] Other (See Comments)    UNKNOWN [SEE BELOW]  Has patient had a PCN reaction causing immediate rash, facial/tongue/throat swelling, SOB or lightheadedness with hypotension: Unknown Has patient had a PCN reaction causing severe rash involving mucus membranes or skin necrosis: Uknown PATIENT HAS HAD A PCN REACTION THAT REQUIRED HOSPITALIZATION: >> UNSPECIFIED REACTION WHILE HE WAS ALREADY ADMITTED TO A HOSPITAL.  Has patient had a PCN reaction occurring within the last 10 years: No    Oxycodone Other (See Comments)    UNSPECIFIED REACTION  TOLERATES APAP WITHOUT OXYCODONE   Percocet [Oxycodone-Acetaminophen] Other (See Comments)    Unknown. Tolerates acetaminophen alone.   Antimicrobials this admission: Vancomycin  11/8>> Ceftriaxone 11/9>> Cefepime 11/8 X1   Microbiology results: 11/8 Bcx: ngtd x 3 days 11/10 wound cx; gram stain few GPC  Thank you for allowing pharmacy to be a part of this patient's care.   Isac Sarna, BS Pharm D, BCPS Clinical Pharmacist 08/13/2022 12:06 PM

## 2022-08-14 DIAGNOSIS — R338 Other retention of urine: Secondary | ICD-10-CM | POA: Diagnosis not present

## 2022-08-14 DIAGNOSIS — M86172 Other acute osteomyelitis, left ankle and foot: Secondary | ICD-10-CM | POA: Diagnosis not present

## 2022-08-14 DIAGNOSIS — L02612 Cutaneous abscess of left foot: Secondary | ICD-10-CM | POA: Diagnosis present

## 2022-08-14 DIAGNOSIS — G2581 Restless legs syndrome: Secondary | ICD-10-CM | POA: Diagnosis not present

## 2022-08-14 DIAGNOSIS — E785 Hyperlipidemia, unspecified: Secondary | ICD-10-CM | POA: Diagnosis not present

## 2022-08-14 MED ORDER — ADULT MULTIVITAMIN W/MINERALS CH
1.0000 | ORAL_TABLET | Freq: Every day | ORAL | Status: DC
  Administered 2022-08-14 – 2022-08-18 (×5): 1 via ORAL
  Filled 2022-08-14 (×5): qty 1

## 2022-08-14 MED ORDER — JUVEN PO PACK
1.0000 | PACK | Freq: Two times a day (BID) | ORAL | Status: DC
  Administered 2022-08-14 – 2022-08-18 (×9): 1 via ORAL
  Filled 2022-08-14 (×10): qty 1

## 2022-08-14 MED ORDER — INFLUENZA VAC A&B SA ADJ QUAD 0.5 ML IM PRSY
0.5000 mL | PREFILLED_SYRINGE | INTRAMUSCULAR | Status: AC
  Administered 2022-08-15: 0.5 mL via INTRAMUSCULAR
  Filled 2022-08-14: qty 0.5

## 2022-08-14 MED ORDER — CHLORHEXIDINE GLUCONATE CLOTH 2 % EX PADS
6.0000 | MEDICATED_PAD | Freq: Every day | CUTANEOUS | Status: DC
  Administered 2022-08-14 – 2022-08-18 (×4): 6 via TOPICAL

## 2022-08-14 MED ORDER — ENSURE ENLIVE PO LIQD
237.0000 mL | Freq: Two times a day (BID) | ORAL | Status: DC
  Administered 2022-08-14 – 2022-08-18 (×7): 237 mL via ORAL

## 2022-08-14 MED ORDER — SODIUM CHLORIDE 0.9 % IV SOLN
8.0000 mg/kg | Freq: Every day | INTRAVENOUS | Status: DC
  Administered 2022-08-14 – 2022-08-17 (×4): 500 mg via INTRAVENOUS
  Filled 2022-08-14 (×5): qty 10

## 2022-08-14 MED ORDER — SODIUM CHLORIDE 0.9 % IV SOLN
2.0000 g | Freq: Three times a day (TID) | INTRAVENOUS | Status: DC
  Administered 2022-08-14 – 2022-08-18 (×13): 2 g via INTRAVENOUS
  Filled 2022-08-14 (×13): qty 12.5

## 2022-08-14 NOTE — Consult Note (Signed)
Pharmacy Antibiotic Note  Samuel Olson is a 81 y.o. male admitted on 08/10/2022 with  osteomyelitis of foot .  Patient with nonhealing ulcer of his left foot, status post surgery at [facility] in August 2023. History of MRSA treated with doxycycline. Pharmacy has been consulted for vancomycin dosing.  Patient post I&D of left foot, partial 5th ray amputation of L foot 11/10. Will need PICC line for approximately 6 week course of antibiotics. Will plan VT 11/12 prior to 2200 dose.May have more information on wound cx then as well. VT levels this evening, wound cx + for Pseudomonas . D/c ceftriaxone and start cefepime.  Plan: Continue vancomycin '1250mg'$  IV q24h Cefepime 2gm IV q8h F/U cxs and clinical progress Monitor v/s , labs and levels as indicated  Height: '5\' 7"'$  (170.2 cm) Weight: 68 kg (150 lb) IBW/kg (Calculated) : 66.1  Temp (24hrs), Avg:98 F (36.7 C), Min:97.7 F (36.5 C), Max:98.5 F (36.9 C)  Recent Labs  Lab 08/10/22 1720 08/11/22 0432 08/12/22 0420 08/13/22 0530  WBC 7.3 7.1  --  6.6  CREATININE 0.70 0.66 0.57* 0.62     Estimated Creatinine Clearance: 67.7 mL/min (by C-G formula based on SCr of 0.62 mg/dL).    Allergies  Allergen Reactions   Phenergan [Promethazine] Other (See Comments)    Cramps, shakes.   Augmentin [Amoxicillin-Pot Clavulanate] Other (See Comments)    UNKNOWN [SEE BELOW]  Has patient had a PCN reaction causing immediate rash, facial/tongue/throat swelling, SOB or lightheadedness with hypotension: Unknown Has patient had a PCN reaction causing severe rash involving mucus membranes or skin necrosis: Uknown PATIENT HAS HAD A PCN REACTION THAT REQUIRED HOSPITALIZATION: >> UNSPECIFIED REACTION WHILE HE WAS ALREADY ADMITTED TO A HOSPITAL.  Has patient had a PCN reaction occurring within the last 10 years: No    Oxycodone Other (See Comments)    UNSPECIFIED REACTION  TOLERATES APAP WITHOUT OXYCODONE   Percocet [Oxycodone-Acetaminophen] Other  (See Comments)    Unknown. Tolerates acetaminophen alone.   Antimicrobials this admission: Vancomycin 11/8>> Ceftriaxone 11/9>>11 Cefepime 11/8 X1 restarted 11/12>>   Microbiology results: 11/8 Bcx: ngtd 11/10 wound cx;  wound cx; gram stain few staph epi, rare pseudomona aeruginosa  Thank you for allowing pharmacy to be a part of this patient's care.   Isac Sarna, BS Pharm D, BCPS Clinical Pharmacist 08/14/2022 1:13 PM

## 2022-08-14 NOTE — Progress Notes (Signed)
Initial Nutrition Assessment RD working remotely.   DOCUMENTATION CODES:   Not applicable  INTERVENTION:  - ordered Ensure Plus High Protein po BID, each supplement provides 350 kcal and 20 grams of protein.  - ordered 1 packet Juven BID, each packet provides 95 calories, 2.5 grams of protein (collagen), and 9.8 grams of carbohydrate (3 grams sugar); also contains 7 grams of L-arginine and L-glutamine, 300 mg vitamin C, 15 mg vitamin E, 1.2 mcg vitamin B-12, 9.5 mg zinc, 200 mg calcium, and 1.5 g  Calcium Beta-hydroxy-Beta-methylbutyrate to support wound healing.  - ordered 1 tablet multivitamin with minerals/day.  - weigh patient today.   - complete NFPE when feasible.    NUTRITION DIAGNOSIS:   Increased nutrient needs related to acute illness, post-op healing, wound healing as evidenced by estimated needs.  GOAL:   Patient will meet greater than or equal to 90% of their needs  MONITOR:   PO intake, Supplement acceptance, Labs, Weight trends  REASON FOR ASSESSMENT:   Malnutrition Screening Tool  ASSESSMENT:   81 y.o. male, with medical history of GERD, mood disorder, restless leg syndrome, remote history of seizures (last in 1973), anxiety, IBS, arthritis, cervical myelopathy, sleep apnea, vertigo, HLD, biliary tract cancer, and dementia. He was seen by a Podiatrist for evaluation of myelitis as he has a non-healing L foot ulcer s/p surgery in 05/2022. MRI indicated osteomyelitis of L metatarsal area.  He has mainly been eating 75-100% at meals since admission; ate 25% of dinner last night as the only percentage outside of this range.  Weight on 11/8 was documented as 150 lb which appears to possibly be a stated weight. PTA the most recently documented weight was 157 lb on 06/10/22 which would indicate 7 lb weight loss (4.4% body weight) in 2 months; not significant for time frame. Prior to 9/8, the most recently documented weight was 163 lb on 12/08/21 which would indicate 13  lb weight loss (8% body weight) in the past 8 months; not significant for time frame.  Per notes: - started on broad-spectrum abx for suspected osteomyelitis   Labs reviewed; Ca: 8.5 mg/dl.  Medications reviewed; 6 g melatonin/night, 40 mg oral protonix/day, 1250 mg fibercon/day, 17 g miralax TID, SMOG enema x1 on 11/11.    NUTRITION - FOCUSED PHYSICAL EXAM:  RD working remotely.  Diet Order:   Diet Order             Diet regular Room service appropriate? Yes; Fluid consistency: Thin  Diet effective now                   EDUCATION NEEDS:   No education needs have been identified at this time  Skin:  Skin Assessment: Skin Integrity Issues: Skin Integrity Issues:: Other (Comment), Incisions Incisions: L leg (11/10) Other: wound to L lateral heel  Last BM:  11/11 (type 1 x1, medium amount)  Height:   Ht Readings from Last 1 Encounters:  08/10/22 '5\' 7"'$  (1.702 m)    Weight:   Wt Readings from Last 1 Encounters:  08/10/22 68 kg    BMI:  Body mass index is 23.49 kg/m.  Estimated Nutritional Needs:  Kcal:  1800-2100 kcal Protein:  85-100 grams Fluid:  >/= 1.8 L/day     Jarome Matin, MS, RD, LDN, CNSC Clinical Dietitian PRN/Relief staff On-call/weekend pager # available in [facility]

## 2022-08-14 NOTE — Progress Notes (Signed)
Patient only wears 2 liters oxygen at night to sleep. He does not wear CPAP. Will give Oxygen 2 liters.

## 2022-08-14 NOTE — Progress Notes (Signed)
PROGRESS NOTE    Samuel Olson  OZD:[id number] DOB: 06/27/1941 DOA: 08/10/2022 PCP: Elyn Aquas    Brief Narrative:  81 year old male admitted to the hospital with osteomyelitis of left fifth toe and underlying left foot abscess.  Started on IV antibiotics.  Seen by podiatry with plans for operative management 11/10.  Patient with amputation of fifth metatarsal.  Currently has wound VAC in place.  Continue IV antibiotics.   Assessment & Plan:   Principal Problem:   Acute osteomyelitis of left foot (HCC) Active Problems:   Chronic constipation   Sleep apnea   Acute urinary retention   Abscess of left foot   Acute left fifth metatarsal osteomyelitis Abscess left foot Cellulitis left lower extremity -Continue on IV antibiotics -Podiatry following -Status post operative management -Intraoperative cultures growing staph epi as well as Pseudomonas -Discussed with infectious disease, Dr. Baxter Flattery.  Ceftriaxone changed to cefepime.  Awaiting final operative culture results as well as bone biopsy -Continue wound VAC.  Postop care per podiatry.  Urinary retention -Bladder scan shows residual urinary volume of almost 900 cc -Foley catheter was placed for retention -He was started on Flomax, but discontinued due to soft blood pressures  Constipation -Start on frequent MiraLAX doses -Patient did have a bowel movement after enema  Hyperlipidemia -Continue statin  Mood disorder -Continue Depakote  OSA -Continue on CPAP   DVT prophylaxis: enoxaparin (LOVENOX) injection 40 mg Start: 08/13/22 1000  Code Status: DNR Family Communication: No family at the bedside Disposition Plan: Status is: Inpatient Remains inpatient appropriate because: Needs operative management of osteomyelitis and subsequent placement to skilled nursing facility     Consultants:  Podiatry  Procedures:  Incision and drainage of abscess, left foot 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, left foot Excisional  debridement of ulceration, left foot Application of KCI Wound VAC, left foot  Antimicrobials:  Vancomycin Ceftriaxone DC'd 11/12 Cefepime   Subjective: Patient had a bowel movement last night after enema.  Does not have any other new complaints today.  Objective: Vitals:   08/13/22 1425 08/13/22 2102 08/14/22 0516 08/14/22 1300  BP: (!) 94/58 (!) 96/59 (!) 97/56 (!) 100/55  Pulse: 78 79 72 75  Resp:  '16 18 18  '$ Temp:  97.7 F (36.5 C) 97.8 F (36.6 C) 98.1 F (36.7 C)  TempSrc:  Oral  Oral  SpO2:  95% 94% 96%  Weight:    63 kg  Height:        Intake/Output Summary (Last 24 hours) at 08/14/2022 1832 Last data filed at 08/14/2022 1700 Gross per 24 hour  Intake 1214.79 ml  Output 1800 ml  Net -585.21 ml   Filed Weights   08/10/22 1446 08/14/22 1300  Weight: 68 kg 63 kg    Examination:  General exam: Appears calm and comfortable  Respiratory system: Clear to auscultation. Respiratory effort normal. Cardiovascular system: S1 & S2 heard, RRR. No JVD, murmurs, rubs, gallops or clicks. No pedal edema. Gastrointestinal system: Abdomen is distended, soft and nontender. No organomegaly or masses felt. Normal bowel sounds heard. Central nervous system: Alert and oriented. No focal neurological deficits. Extremities: Left foot is wrapped in dressing with wound VAC Skin: No rashes, lesions or ulcers Psychiatry: Judgement and insight appear normal. Mood & affect appropriate.     Data Reviewed: I have personally reviewed following labs and imaging studies  CBC: Recent Labs  Lab 08/10/22 1720 08/11/22 0432 08/13/22 0530  WBC 7.3 7.1 6.6  NEUTROABS 5.8  --   --  HGB 13.7 11.5* 11.9*  HCT 42.9 36.6* 36.8*  MCV 100.9* 101.1* 99.7  PLT 198 182 878   Basic Metabolic Panel: Recent Labs  Lab 08/10/22 1720 08/11/22 0432 08/12/22 0420 08/13/22 0530  NA 140 140  --  139  K 4.2 4.2  --  4.3  CL 102 105  --  102  CO2 30 31  --  31  GLUCOSE 102* 94  --  96  BUN 32*  31*  --  19  CREATININE 0.70 0.66 0.57* 0.62  CALCIUM 9.0 8.5*  --  8.5*   GFR: Estimated Creatinine Clearance: 64.5 mL/min (by C-G formula based on SCr of 0.62 mg/dL). Liver Function Tests: Recent Labs  Lab 08/10/22 1720  AST 26  ALT 20  ALKPHOS 88  BILITOT 0.4  PROT 7.9  ALBUMIN 3.8   No results for input(s): "LIPASE", "AMYLASE" in the last 168 hours. No results for input(s): "AMMONIA" in the last 168 hours. Coagulation Profile: No results for input(s): "INR", "PROTIME" in the last 168 hours. Cardiac Enzymes: No results for input(s): "CKTOTAL", "CKMB", "CKMBINDEX", "TROPONINI" in the last 168 hours. BNP (last 3 results) No results for input(s): "PROBNP" in the last 8760 hours. HbA1C: No results for input(s): "HGBA1C" in the last 72 hours.  CBG: Recent Labs  Lab 08/12/22 1444  GLUCAP 82   Lipid Profile: No results for input(s): "CHOL", "HDL", "LDLCALC", "TRIG", "CHOLHDL", "LDLDIRECT" in the last 72 hours. Thyroid Function Tests: No results for input(s): "TSH", "T4TOTAL", "FREET4", "T3FREE", "THYROIDAB" in the last 72 hours. Anemia Panel: No results for input(s): "VITAMINB12", "FOLATE", "FERRITIN", "TIBC", "IRON", "RETICCTPCT" in the last 72 hours. Sepsis Labs: No results for input(s): "PROCALCITON", "LATICACIDVEN" in the last 168 hours.  Recent Results (from the past 240 hour(s))  Culture, blood (Routine X 2) w Reflex to ID Panel     Status: None (Preliminary result)   Collection Time: 08/10/22  9:59 PM   Specimen: Right Antecubital; Blood  Result Value Ref Range Status   Specimen Description RIGHT ANTECUBITAL  Final   Special Requests   Final    BOTTLES DRAWN AEROBIC AND ANAEROBIC Blood Culture adequate volume   Culture   Final    NO GROWTH 4 DAYS Performed at [facility], [address] [place], [place] [address]    Report Status PENDING  Incomplete  Culture, blood (Routine X 2) w Reflex to ID Panel     Status: None (Preliminary result)   Collection Time:  08/10/22 10:00 PM   Specimen: BLOOD RIGHT HAND  Result Value Ref Range Status   Specimen Description BLOOD RIGHT HAND  Final   Special Requests   Final    BOTTLES DRAWN AEROBIC AND ANAEROBIC Blood Culture adequate volume   Culture   Final    NO GROWTH 4 DAYS Performed at [facility], [address] [place], [place] [address]    Report Status PENDING  Incomplete  Surgical PCR screen     Status: None   Collection Time: 08/12/22  6:16 AM   Specimen: Nasal Mucosa; Nasal Swab  Result Value Ref Range Status   MRSA, PCR NEGATIVE NEGATIVE Final   Staphylococcus aureus NEGATIVE NEGATIVE Final    Comment: (NOTE) The Xpert SA Assay (FDA approved for NASAL specimens in patients 79 years of age and older), is one component of a comprehensive surveillance program. It is not intended to diagnose infection nor to guide or monitor treatment. Performed at [facility], [address], [place], [place] [address]  Aerobic/Anaerobic Culture w Gram Stain (surgical/deep wound)     Status: None (Preliminary result)   Collection Time: 08/12/22  1:42 PM   Specimen: Soft Tissue, Other  Result Value Ref Range Status   Specimen Description   Final    TISSUE SOFT Performed at [facility], [address], [place], [place] [address]    Special Requests   Final    FOOT LEFT INCISION AND DRAINAGE Performed at [facility], [address], [place], [place] [address]    Gram Stain   Final    NO WBC SEEN RARE GRAM POSITIVE COCCI IN PAIRS Performed at [facility], [place] [address], [place], [place] [address]    Culture   Final    RARE STAPHYLOCOCCUS EPIDERMIDIS RARE PSEUDOMONAS AERUGINOSA SUSCEPTIBILITIES TO FOLLOW NO ANAEROBES ISOLATED; CULTURE IN PROGRESS FOR 5 DAYS    Report Status PENDING  Incomplete         Radiology Studies: No results found.      Scheduled Meds:  aspirin EC  81 mg Oral Daily   Chlorhexidine Gluconate Cloth  6 each Topical Daily   cholecalciferol  5,000 Units  Oral q morning   divalproex  125 mg Oral BID   enoxaparin (LOVENOX) injection  40 mg Subcutaneous Q24H   feeding supplement  237 mL Oral BID BM   [START ON 08/15/2022] influenza vaccine adjuvanted  0.5 mL Intramuscular Tomorrow-1000   melatonin  6 mg Oral QHS   multivitamin with minerals  1 tablet Oral Daily   mupirocin ointment  1 Application Nasal BID   nutrition supplement (JUVEN)  1 packet Oral BID BM   pantoprazole  40 mg Oral Daily   polycarbophil  1,250 mg Oral Daily   polyethylene glycol  17 g Oral TID   pramipexole  1 mg Oral TID   simvastatin  40 mg Oral QHS   Continuous Infusions:  ceFEPime (MAXIPIME) IV 2 g (08/14/22 1356)   vancomycin 166.7 mL/hr at 08/14/22 0034     LOS: 4 days    Time spent: 76mns    JKathie Dike MD [facility]   If 7PM-7AM, please contact night-coverage [email or web address]  08/14/2022, 6:32 PM

## 2022-08-14 NOTE — Consult Note (Signed)
[facility] for Infectious Disease  Total days of antibiotics 5         Reason for Consult: osteomyelitis of left foot   Referring Physician: memon  Principal Problem:   Acute osteomyelitis of left foot [facility])    HPI: Samuel Olson is a 81 y.o. male with PMHx of nonhealing left foot ulcer and abscess followed by dr Caprice Beaver from podiatry. He recently completed course of doxycycline for MRSA wound. His outpatient mri showed evidence of osteomyelitis to 5th metatarsal. However he was referred to hospital for evaluation and anticipated surgical debridement on 11/10. Xray showed 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 Adjacent new soft tissue swelling/gas, suggesting soft tissue infection, with presumed osteomyelitis at the surgical margin.He was started on vancomycin and ceftriaxone. He underwent IX D of left foot abscess and partial 5th ray amputation and wound vac placement. Tissue sent for culture and path. Infectious work up - shows blood cx ngtd, and tissue cx showing staph epi and pseudomonas. Awaiting path results to see if clear margins and decision for abtx management. His sed rate is 32.   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   Sleep apnea    Uses O2 at 2L while sleeping; "can't tolerate CPAP OR BIPAP" * (04/17/2017)   Vertigo    Wears glasses     Allergies:  Allergies  Allergen Reactions   Phenergan [Promethazine] Other (See Comments)    Cramps, shakes.   Augmentin  [Amoxicillin-Pot Clavulanate] Other (See Comments)    UNKNOWN [SEE BELOW]  Has patient had a PCN reaction causing immediate rash, facial/tongue/throat swelling, SOB or lightheadedness with hypotension: Unknown Has patient had a PCN reaction causing severe rash involving mucus membranes or skin necrosis: Uknown PATIENT HAS HAD A PCN REACTION THAT REQUIRED HOSPITALIZATION: >> UNSPECIFIED REACTION WHILE HE WAS ALREADY ADMITTED TO A HOSPITAL.  Has patient had a PCN reaction occurring within the last 10 years: No    Oxycodone Other (See Comments)    UNSPECIFIED REACTION  TOLERATES APAP WITHOUT OXYCODONE   Percocet [Oxycodone-Acetaminophen] Other (See Comments)    Unknown. Tolerates acetaminophen alone.    MEDICATIONS:  aspirin EC  81 mg Oral Daily   Chlorhexidine Gluconate Cloth  6 each Topical Daily   cholecalciferol  5,000 Units Oral q morning   divalproex  125 mg Oral BID   enoxaparin (LOVENOX) injection  40 mg Subcutaneous Q24H   feeding supplement  237 mL Oral BID BM   melatonin  6 mg Oral QHS   multivitamin with minerals  1 tablet Oral Daily   mupirocin ointment  1 Application Nasal BID   nutrition supplement (JUVEN)  1 packet Oral BID BM   pantoprazole  40 mg Oral Daily   polycarbophil  1,250 mg Oral Daily   polyethylene glycol  17 g Oral TID  pramipexole  1 mg Oral TID   simvastatin  40 mg Oral QHS    Social History   Tobacco Use   Smoking status: Former    Types: Cigarettes    Quit date: 1966    Years since quitting: 57.9   Smokeless tobacco: Never   Tobacco comments:    quit smoking cigarettes in 1966  Vaping Use   Vaping Use: Never used  Substance Use Topics   Alcohol use: No   Drug use: No    Family History  Problem Relation Age of Onset   Heart disease Mother    Heart disease Father    Breast cancer Sister    Heart disease Brother    Colon cancer Neg Hx    Esophageal cancer Neg Hx    Inflammatory bowel disease Neg Hx    Liver disease Neg Hx     Pancreatic cancer Neg Hx    Rectal cancer Neg Hx    Stomach cancer Neg Hx      LABS: Results for orders placed or performed during the hospital encounter of 08/10/22 (from the past 17 hour(s))  Aerobic/Anaerobic Culture w Gram Stain (surgical/deep wound)     Status: None (Preliminary result)   Collection Time: 08/12/22  1:42 PM   Specimen: Soft Tissue, Other  Result Value Ref Range   Specimen Description      TISSUE SOFT Performed at [facility], [address], [place], [place] [address]    Special Requests      FOOT LEFT INCISION AND DRAINAGE Performed at [facility], [address] [place], [place] [address]    Gram Stain NO WBC SEEN RARE GRAM POSITIVE COCCI IN PAIRS     Culture      RARE STAPHYLOCOCCUS EPIDERMIDIS RARE PSEUDOMONAS AERUGINOSA CULTURE REINCUBATED FOR BETTER GROWTH Performed at [facility], [place] [address], [place], [place] [address]    Report Status PENDING   Glucose, capillary     Status: None   Collection Time: 08/12/22  2:44 PM  Result Value Ref Range   Glucose-Capillary 82 70 - 99 mg/dL    Comment: Glucose reference range applies only to samples taken after fasting for at least 8 hours.  CBC     Status: Abnormal   Collection Time: 08/13/22  5:30 AM  Result Value Ref Range   WBC 6.6 4.0 - 10.5 K/uL   RBC 3.69 (L) 4.22 - 5.81 MIL/uL   Hemoglobin 11.9 (L) 13.0 - 17.0 g/dL   HCT 36.8 (L) 39.0 - 52.0 %   MCV 99.7 80.0 - 100.0 fL   MCH 32.2 26.0 - 34.0 pg   MCHC 32.3 30.0 - 36.0 g/dL   RDW 13.0 11.5 - 15.5 %   Platelets 162 150 - 400 K/uL   nRBC 0.0 0.0 - 0.2 %    Comment: Performed at [facility], [address], [place], [place] [address]  Basic metabolic panel     Status: Abnormal   Collection Time: 08/13/22  5:30 AM  Result Value Ref Range   Sodium 139 135 - 145 mmol/L   Potassium 4.3 3.5 - 5.1 mmol/L   Chloride 102 98 - 111 mmol/L   CO2 31 22 - 32 mmol/L   Glucose, Bld 96 70 - 99 mg/dL    Comment: Glucose reference range applies  only to samples taken after fasting for at least 8 hours.   BUN 19 8 - 23 mg/dL   Creatinine, Ser 0.62 0.61 - 1.24  mg/dL   Calcium 8.5 (L) 8.9 - 10.3 mg/dL   GFR, Estimated >60 >60 mL/min    Comment: (NOTE) Calculated using the CKD-EPI Creatinine Equation (2021)    Anion gap 6 5 - 15    Comment: Performed at [facility], [address], [place], [place] [address]    MICRO:  RARE STAPHYLOCOCCUS EPIDERMIDIS RARE PSEUDOMONAS AERUGINOSA SUSCEPTIBILITIES TO FOLLOW   IMAGING: DG Foot Complete Left  Result Date: 08/12/2022 CLINICAL DATA:  Status post amputation. EXAM: LEFT FOOT - COMPLETE 3+ VIEW COMPARISON:  Left foot x-rays dated August 10, 2022. FINDINGS: Interval fifth ray amputation with overlying wound VAC in place. No acute fracture or dislocation. Osteopenia. IMPRESSION: 1. Interval fifth ray amputation. Electronically Signed   By: Titus Dubin M.D.   On: 08/12/2022 16:45   DG Foot 2 Views Left  Result Date: 08/12/2022 CLINICAL DATA:  Osteomyelitis. EXAM: LEFT FOOT - 2 VIEW COMPARISON:  Left foot x-rays dated August 10, 2022. FLUOROSCOPY TIME:  Radiation Exposure Index (as provided by the fluoroscopic device): 0.06 mGy Kerma C-arm fluoroscopic images were obtained intraoperatively and submitted for post operative interpretation. FINDINGS: Single intraoperative AP view of the midfoot demonstrates further amputation of the fifth metatarsal shaft. IMPRESSION: 1. Intraoperative fluoroscopic guidance for fifth metatarsal amputation. Electronically Signed   By: Titus Dubin M.D.   On: 08/12/2022 16:43   DG C-Arm 1-60 Min-No Report  Result Date: 08/12/2022 Fluoroscopy was utilized by the requesting physician.  No radiographic interpretation.    Assessment/Plan:  81yo M with left foot chronic plantar non-healing ulcer that developed 5th metatarsal osteomyelitis s/p partial resection of 5th MT, cx showing PsA and rare staph epi.  - recommend to change abtx to daptomycin '8mg'$ /kg/day  plus cefepime. - will follow culture results to see if can find oral equivalent and also it would be good to see if path results had clean margins since he may not necessarily need 6 wk of iv abtx given recent resection of infected bone and tissue  Dr Juleen China to provide further recs tomorrow.

## 2022-08-14 NOTE — Progress Notes (Signed)
50 ml output in wound vac.  Pt tol smog enema well, had med amount hard stool followed by softer stool.  Pain managed with 1x dose of prn morphine.  Pt slept most of noc.

## 2022-08-14 NOTE — Consult Note (Addendum)
Pharmacy Antibiotic Note  Samuel Olson is a 81 y.o. male admitted on 08/10/2022 with  osteomyelitis of foot .  Patient with nonhealing ulcer of his left foot, status post surgery at [facility] in August 2023. History of MRSA treated with doxycycline. Pharmacy has been consulted for vancomycin dosing.  Patient post I&D of left foot, partial 5th ray amputation of L foot 11/10. Will need PICC line for approximately 6 week course of antibiotics. . Wound cx + for Pseudomonas . D/c ceftriaxone and start cefepime. Now ID wants to transition vancomycin to daptomycin  Plan: Daptomycin '8mg'$ /kg('500mg'$ ) IV q24h Cefepime 2gm IV q8h F/U cxs and clinical progress Monitor v/s , labs   Height: '5\' 7"'$  (170.2 cm) Weight: 63 kg (138 lb 14.2 oz) (standing weight) IBW/kg (Calculated) : 66.1  Temp (24hrs), Avg:97.9 F (36.6 C), Min:97.7 F (36.5 C), Max:98.1 F (36.7 C)  Recent Labs  Lab 08/10/22 1720 08/11/22 0432 08/12/22 0420 08/13/22 0530  WBC 7.3 7.1  --  6.6  CREATININE 0.70 0.66 0.57* 0.62     Estimated Creatinine Clearance: 64.5 mL/min (by C-G formula based on SCr of 0.62 mg/dL).    Allergies  Allergen Reactions   Phenergan [Promethazine] Other (See Comments)    Cramps, shakes.   Augmentin [Amoxicillin-Pot Clavulanate] Other (See Comments)    UNKNOWN [SEE BELOW]  Has patient had a PCN reaction causing immediate rash, facial/tongue/throat swelling, SOB or lightheadedness with hypotension: Unknown Has patient had a PCN reaction causing severe rash involving mucus membranes or skin necrosis: Uknown PATIENT HAS HAD A PCN REACTION THAT REQUIRED HOSPITALIZATION: >> UNSPECIFIED REACTION WHILE HE WAS ALREADY ADMITTED TO A HOSPITAL.  Has patient had a PCN reaction occurring within the last 10 years: No    Oxycodone Other (See Comments)    UNSPECIFIED REACTION  TOLERATES APAP WITHOUT OXYCODONE   Percocet [Oxycodone-Acetaminophen] Other (See Comments)    Unknown. Tolerates acetaminophen alone.    Antimicrobials this admission: Vancomycin 11/8>> 11/12 Ceftriaxone 11/9>>11/12 Cefepime 11/8 X1 restarted 11/12>> Daptomycin 11/12>>   Microbiology results: 11/8 Bcx: ngtd 11/10 wound cx;  wound cx; gram stain few staph epi, rare pseudomona aeruginosa  Thank you for allowing pharmacy to be a part of this patient's care.   Isac Sarna, BS Pharm D, BCPS Clinical Pharmacist 08/14/2022 7:47 PM

## 2022-08-14 NOTE — Progress Notes (Signed)
PODIATRY PROGRESS NOTE  SUBJECTIVE Samuel Olson POD 2 following I&D, partial 5th ray amputation and debridement of ulceration of L foot.  Doing well.  Pain controlled.  No nausea, vomiting, fever or chills.  Family at bedside.  MEDICATIONS Scheduled Meds:  aspirin EC  81 mg Oral Daily   Chlorhexidine Gluconate Cloth  6 each Topical Daily   cholecalciferol  5,000 Units Oral q morning   divalproex  125 mg Oral BID   enoxaparin (LOVENOX) injection  40 mg Subcutaneous Q24H   feeding supplement  237 mL Oral BID BM   melatonin  6 mg Oral QHS   multivitamin with minerals  1 tablet Oral Daily   mupirocin ointment  1 Application Nasal BID   nutrition supplement (JUVEN)  1 packet Oral BID BM   pantoprazole  40 mg Oral Daily   polycarbophil  1,250 mg Oral Daily   polyethylene glycol  17 g Oral TID   pramipexole  1 mg Oral TID   simvastatin  40 mg Oral QHS   Continuous Infusions:  cefTRIAXone (ROCEPHIN)  IV 2 g (08/14/22 0537)   vancomycin 166.7 mL/hr at 08/14/22 0034   PRN Meds:.fluticasone, hydrALAZINE, HYDROcodone-acetaminophen, morphine injection, mouth rinse, polyethylene glycol  OBJECTIVE Vital signs in last 24 hours:   Temp:  [97.7 F (36.5 C)-98.5 F (36.9 C)] 97.8 F (36.6 C) (11/12 0516) Pulse Rate:  [72-79] 72 (11/12 0516) Resp:  [16-18] 18 (11/12 0516) BP: (82-97)/(56-59) 97/56 (11/12 0516) SpO2:  [94 %-95 %] 94 % (11/12 0516)  GENERAL:  Alert, NAD.  L foot elevated on pillow. EQUIPMENT:  Wound VAC in place and operational at 125 mmHg continuous pressure. DRESSING:  Intact without strikethrough. INTEGUMENT:  Incision well approximated with sutures intact.  Mild periwound rubor.  Ulceration plantar L foot granular.  No necrosis present. VASCULAR:  L DP pulse palpable.  L PT pulse palpable.  Capillary refill brisk to remaining digits of L foot. MUSCULOSKELETAL:  Calves soft, NT.  LAB/TEST RESULTS  Recent Labs    08/12/22 0420 08/13/22 0530  WBC  --  6.6  HGB  --   11.9*  HCT  --  36.8*  PLT  --  162  NA  --  139  K  --  4.3  CL  --  102  CO2  --  31  BUN  --  19  CREATININE 0.57* 0.62  GLUCOSE  --  96  CALCIUM  --  8.5*    Recent Results (from the past 240 hour(s))  Culture, blood (Routine X 2) w Reflex to ID Panel     Status: None (Preliminary result)   Collection Time: 08/10/22  9:59 PM   Specimen: Right Antecubital; Blood  Result Value Ref Range Status   Specimen Description RIGHT ANTECUBITAL  Final   Special Requests   Final    BOTTLES DRAWN AEROBIC AND ANAEROBIC Blood Culture adequate volume   Culture   Final    NO GROWTH 4 DAYS Performed at [facility], [address] [place], [place] [address]    Report Status PENDING  Incomplete  Culture, blood (Routine X 2) w Reflex to ID Panel     Status: None (Preliminary result)   Collection Time: 08/10/22 10:00 PM   Specimen: BLOOD RIGHT HAND  Result Value Ref Range Status   Specimen Description BLOOD RIGHT HAND  Final   Special Requests   Final    BOTTLES DRAWN AEROBIC AND ANAEROBIC Blood Culture adequate volume  Culture   Final    NO GROWTH 4 DAYS Performed at [facility], [address], [place], [place] [address]    Report Status PENDING  Incomplete  Surgical PCR screen     Status: None   Collection Time: 08/12/22  6:16 AM   Specimen: Nasal Mucosa; Nasal Swab  Result Value Ref Range Status   MRSA, PCR NEGATIVE NEGATIVE Final   Staphylococcus aureus NEGATIVE NEGATIVE Final    Comment: (NOTE) The Xpert SA Assay (FDA approved for NASAL specimens in patients 33 years of age and older), is one component of a comprehensive surveillance program. It is not intended to diagnose infection nor to guide or monitor treatment. Performed at [facility], [address], [place], [place] [address]   Aerobic/Anaerobic Culture w Gram Stain (surgical/deep wound)     Status: None (Preliminary result)   Collection Time: 08/12/22  1:42 PM   Specimen: Soft Tissue, Other  Result Value Ref Range  Status   Specimen Description   Final    TISSUE SOFT Performed at [facility], [address] [place], [place] [address]    Special Requests   Final    FOOT LEFT INCISION AND DRAINAGE Performed at [facility], [address], [place], [place] [address]    Gram Stain NO WBC SEEN RARE GRAM POSITIVE COCCI IN PAIRS   Final   Culture   Final    RARE STAPHYLOCOCCUS EPIDERMIDIS RARE PSEUDOMONAS AERUGINOSA CULTURE REINCUBATED FOR BETTER GROWTH Performed at [facility], [place] [address], [place], [place] [address]    Report Status PENDING  Incomplete     DG Foot Complete Left  Result Date: 08/12/2022 CLINICAL DATA:  Status post amputation. EXAM: LEFT FOOT - COMPLETE 3+ VIEW COMPARISON:  Left foot x-rays dated August 10, 2022. FINDINGS: Interval fifth ray amputation with overlying wound VAC in place. No acute fracture or dislocation. Osteopenia. IMPRESSION: 1. Interval fifth ray amputation. Electronically Signed   By: Titus Dubin M.D.   On: 08/12/2022 16:45   DG Foot 2 Views Left  Result Date: 08/12/2022 CLINICAL DATA:  Osteomyelitis. EXAM: LEFT FOOT - 2 VIEW COMPARISON:  Left foot x-rays dated August 10, 2022. FLUOROSCOPY TIME:  Radiation Exposure Index (as provided by the fluoroscopic device): 0.06 mGy Kerma C-arm fluoroscopic images were obtained intraoperatively and submitted for post operative interpretation. FINDINGS: Single intraoperative AP view of the midfoot demonstrates further amputation of the fifth metatarsal shaft. IMPRESSION: 1. Intraoperative fluoroscopic guidance for fifth metatarsal amputation. Electronically Signed   By: Titus Dubin M.D.   On: 08/12/2022 16:43   DG C-Arm 1-60 Min-No Report  Result Date: 08/12/2022 Fluoroscopy was utilized by the requesting physician.  No radiographic interpretation.    ASSESSMENT POD 2 S/P I&D, partial 5th ray amputation and debridement of ulceration of L foot.   PLAN Changed Wound VAC today personally.  Operational  at 125 mmHg continuous pressure.  Will change VAC 08/16/2022. Continue IV antibiotics. Recommend 6 week of IV antibiotics.  May put weight on L heel to transfer to bedside toilet only.  Otherwise, remain NWB L foot.  [name] 08/14/2022, 12:42 PM

## 2022-08-15 DIAGNOSIS — M86172 Other acute osteomyelitis, left ankle and foot: Secondary | ICD-10-CM | POA: Diagnosis not present

## 2022-08-15 LAB — BASIC METABOLIC PANEL
Anion gap: 5 (ref 5–15)
BUN: 28 mg/dL — ABNORMAL HIGH (ref 8–23)
CO2: 30 mmol/L (ref 22–32)
Calcium: 8.3 mg/dL — ABNORMAL LOW (ref 8.9–10.3)
Chloride: 104 mmol/L (ref 98–111)
Creatinine, Ser: 0.64 mg/dL (ref 0.61–1.24)
GFR, Estimated: >60 mL/min (ref >60)
Glucose, Bld: 97 mg/dL (ref 70–99)
Potassium: 4.3 mmol/L (ref 3.5–5.1)
Sodium: 139 mmol/L (ref 135–145)

## 2022-08-15 LAB — CBC
HCT: 34.6 % — ABNORMAL LOW (ref 39.0–52.0)
Hemoglobin: 11.1 g/dL — ABNORMAL LOW (ref 13.0–17.0)
MCH: 32.2 pg (ref 26.0–34.0)
MCHC: 32.1 g/dL (ref 30.0–36.0)
MCV: 100.3 fL — ABNORMAL HIGH (ref 80.0–100.0)
Platelets: 154 10*3/uL (ref 150–400)
RBC: 3.45 MIL/uL — ABNORMAL LOW (ref 4.22–5.81)
RDW: 13.2 % (ref 11.5–15.5)
WBC: 5.3 10*3/uL (ref 4.0–10.5)
nRBC: 0 % (ref 0.0–0.2)

## 2022-08-15 LAB — CULTURE, BLOOD (ROUTINE X 2)
Culture: NO GROWTH
Culture: NO GROWTH
Special Requests: ADEQUATE
Special Requests: ADEQUATE

## 2022-08-15 MED ORDER — ALPRAZOLAM 0.25 MG PO TABS
0.2500 mg | ORAL_TABLET | Freq: Three times a day (TID) | ORAL | Status: DC | PRN
  Administered 2022-08-15: 0.25 mg via ORAL
  Filled 2022-08-15: qty 1

## 2022-08-15 MED ORDER — RISAQUAD PO CAPS
1.0000 | ORAL_CAPSULE | Freq: Three times a day (TID) | ORAL | Status: DC
  Administered 2022-08-15 – 2022-08-18 (×11): 1 via ORAL
  Filled 2022-08-15 (×11): qty 1

## 2022-08-15 MED ORDER — MORPHINE SULFATE (PF) 2 MG/ML IV SOLN
2.0000 mg | INTRAVENOUS | Status: DC | PRN
  Administered 2022-08-15 – 2022-08-18 (×7): 2 mg via INTRAVENOUS
  Filled 2022-08-15 (×7): qty 1

## 2022-08-15 MED ORDER — ALPRAZOLAM 0.5 MG PO TABS
0.5000 mg | ORAL_TABLET | Freq: Three times a day (TID) | ORAL | Status: DC | PRN
  Administered 2022-08-15: 0.5 mg via ORAL
  Filled 2022-08-15: qty 1

## 2022-08-15 MED ORDER — SENNOSIDES-DOCUSATE SODIUM 8.6-50 MG PO TABS
1.0000 | ORAL_TABLET | Freq: Two times a day (BID) | ORAL | Status: DC
  Administered 2022-08-15 – 2022-08-18 (×7): 1 via ORAL
  Filled 2022-08-15 (×7): qty 1

## 2022-08-15 NOTE — Progress Notes (Signed)
Patient uses 2L [place] at night while sleeping.  No CPAP in room.

## 2022-08-15 NOTE — Progress Notes (Signed)
      [facility]  Date of Admission:  08/10/2022           Reason for visit: Follow up on osteomyelitis  Current antibiotics: Daptomycin Cefepim  ASSESSMENT:    81 y.o. 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  Podiatry following and is recommending 6 weeks of antibiotics.  Surgical path pending to assess for clear margins.  RECOMMENDATIONS:    Continue cefepime and daptomycin Await path Following   Raynelle Highland for Infectious [place] [phone] pager 08/15/2022, 2:24 PM

## 2022-08-15 NOTE — Hospital Course (Addendum)
81 year old male admitted to the hospital with osteomyelitis of left fifth toe and underlying left foot abscess.  Started on IV antibiotics.  Seen by podiatry with plans for operative management 11/10.  Patient with amputation of fifth metatarsal.  Currently has wound VAC in place.  Continue IV antibiotics.   ===================================================================   Assessment & Plan:   Principal Problem:   Acute osteomyelitis of left foot (HCC) Active Problems:   Chronic constipation   Sleep apnea   Acute urinary retention   Abscess of left foot     Acute left fifth metatarsal osteomyelitis/ Abscess left foot Cellulitis left lower extremity - will Continue on IV antibiotics -Podiatry following -Status post operative management -Intraoperative cultures growing staph Epi as well as Pseudomonas -Discussed with infectious disease, Dr. Baxter Flattery.  Ceftriaxone changed to cefepime.  Awaiting final operative culture results as well as bone biopsy -Continue wound VAC.  Postop care per podiatry.   Urinary retention -Bladder scan shows residual urinary volume of almost 900 cc -Foley catheter was placed for retention -He was started on Flomax, but discontinued due to soft blood pressures   Constipation -Start on frequent MiraLAX doses -Patient did have a bowel movement after enema   Hyperlipidemia -Continue statin   Mood disorder -Continue Depakote   OSA -Continue on CPAP

## 2022-08-15 NOTE — TOC Progression Note (Signed)
Transition of Care [facility]) - Progression Note    Patient Details  Name: Samuel Olson MRN: [id number] Date of Birth: 10/30/1940  Transition of [facility]) CM/SW Contact  Salome Arnt, [place] Phone Number: 08/15/2022, 11:49 AM  Clinical Narrative:  LCSW spoke with pt's wife about SNF. They request [facility] first, then [facility], [facility], or [facility]. Wife is aware [facility] and [facility] are unable to make bed offer. Discussed Medicare.gov for facility ratings and she indicates her son has already reviewed these. TOC left message for [facility] and [facility] requesting admissions to review referral. TOC will continue to follow.     Expected Discharge Plan: [place] Barriers to Discharge: Continued Medical Work up  Expected Discharge Plan and Services Expected Discharge Plan: [place] In-house Referral: Clinical Social Work   Post Acute Care Choice: [place] Living arrangements for the past 2 months: Single Family Home                                       Social Determinants of Health (SDOH) Interventions    Readmission Risk Interventions     No data to display

## 2022-08-15 NOTE — Progress Notes (Signed)
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  Pain controlled.    MEDICATIONS Scheduled Meds:  acidophilus  1 capsule Oral TID   aspirin EC  81 mg Oral Daily   Chlorhexidine Gluconate Cloth  6 each Topical Daily   cholecalciferol  5,000 Units Oral q morning   divalproex  125 mg Oral BID   enoxaparin (LOVENOX) injection  40 mg Subcutaneous Q24H   feeding supplement  237 mL Oral BID BM   melatonin  6 mg Oral QHS   multivitamin with minerals  1 tablet Oral Daily   mupirocin ointment  1 Application Nasal BID   nutrition supplement (JUVEN)  1 packet Oral BID BM   pantoprazole  40 mg Oral Daily   polycarbophil  1,250 mg Oral Daily   polyethylene glycol  17 g Oral TID   pramipexole  1 mg Oral TID   senna-docusate  1 tablet Oral BID   Continuous Infusions:  ceFEPime (MAXIPIME) IV Stopped (08/15/22 1241)   DAPTOmycin (CUBICIN) 500 mg in sodium chloride 0.9 % IVPB Stopped (08/14/22 2200)   PRN Meds:.ALPRAZolam, fluticasone, HYDROcodone-acetaminophen, morphine injection, mouth rinse, polyethylene glycol  OBJECTIVE Vital signs in last 24 hours:   Temp:  [97.6 F (36.4 C)-98.2 F (36.8 C)] 97.6 F (36.4 C) (11/13 1440) Pulse Rate:  [61-80] 61 (11/13 1440) Resp:  [17-20] 20 (11/13 1440) BP: (98-112)/(55-70) 112/70 (11/13 1440) SpO2:  [95 %-98 %] 98 % (11/13 1440)  GENERAL:  Alert, NAD.  L foot elevated on pillow. EQUIPMENT:  Wound VAC in place and operational at 125 mmHg continuous pressure. DRESSING:  Intact without strikethrough.  LAB/TEST RESULTS  Recent Labs    08/13/22 0530 08/15/22 0510  WBC 6.6 5.3  HGB 11.9* 11.1*  HCT 36.8* 34.6*  PLT 162 154  NA 139 139  K 4.3 4.3  CL 102 104  CO2 31 30  BUN 19 28*  CREATININE 0.62 0.64  GLUCOSE 96 97  CALCIUM 8.5* 8.3*     Recent Results (from the past 240 hour(s))  Culture, blood (Routine X 2) w Reflex to ID Panel     Status: None    Collection Time: 08/10/22  9:59 PM   Specimen: Right Antecubital; Blood  Result Value Ref Range Status   Specimen Description RIGHT ANTECUBITAL  Final   Special Requests   Final    BOTTLES DRAWN AEROBIC AND ANAEROBIC Blood Culture adequate volume   Culture   Final    NO GROWTH 5 DAYS Performed at [facility] [address] [place], [place] [address]    Report Status 08/15/2022 FINAL  Final  Culture, blood (Routine X 2) w Reflex to ID Panel     Status: None   Collection Time: 08/10/22 10:00 PM   Specimen: BLOOD RIGHT HAND  Result Value Ref Range Status   Specimen Description BLOOD RIGHT HAND  Final   Special Requests   Final    BOTTLES DRAWN AEROBIC AND ANAEROBIC Blood Culture adequate volume   Culture   Final    NO GROWTH 5 DAYS Performed at [facility], [address] [place], [place] [address]    Report Status 08/15/2022 FINAL  Final  Surgical PCR screen     Status: None   Collection Time: 08/12/22  6:16 AM   Specimen: Nasal Mucosa; Nasal Swab  Result Value Ref Range Status   MRSA, PCR NEGATIVE NEGATIVE Final  Staphylococcus aureus NEGATIVE NEGATIVE Final    Comment: (NOTE) The Xpert SA Assay (FDA approved for NASAL specimens in patients 72 years of age and older), is one component of a comprehensive surveillance program. It is not intended to diagnose infection nor to guide or monitor treatment. Performed at [facility], [address], [place], [place] [address]   Aerobic/Anaerobic Culture w Gram Stain (surgical/deep wound)     Status: None (Preliminary result)   Collection Time: 08/12/22  1:42 PM   Specimen: Soft Tissue, Other  Result Value Ref Range Status   Specimen Description   Final    TISSUE SOFT Performed at [facility], [address], [place], [place] [address]    Special Requests   Final    FOOT LEFT INCISION AND DRAINAGE Performed at [facility], [address], [place], [place] [address]    Gram Stain   Final    NO WBC SEEN RARE GRAM POSITIVE  COCCI IN PAIRS Performed at [facility], [place] [address] [place], [place] [address]    Culture   Final    RARE STAPHYLOCOCCUS EPIDERMIDIS RARE PSEUDOMONAS AERUGINOSA NO ANAEROBES ISOLATED; CULTURE IN PROGRESS FOR 5 DAYS    Report Status PENDING  Incomplete   Organism ID, Bacteria STAPHYLOCOCCUS EPIDERMIDIS  Final   Organism ID, Bacteria PSEUDOMONAS AERUGINOSA  Final      Susceptibility   Pseudomonas aeruginosa - MIC*    CEFTAZIDIME 4 SENSITIVE Sensitive     CIPROFLOXACIN <=0.25 SENSITIVE Sensitive     GENTAMICIN <=1 SENSITIVE Sensitive     IMIPENEM 1 SENSITIVE Sensitive     PIP/TAZO 8 SENSITIVE Sensitive     CEFEPIME 2 SENSITIVE Sensitive     * RARE PSEUDOMONAS AERUGINOSA   Staphylococcus epidermidis - MIC*    CIPROFLOXACIN <=0.5 SENSITIVE Sensitive     ERYTHROMYCIN >=8 RESISTANT Resistant     GENTAMICIN <=0.5 SENSITIVE Sensitive     OXACILLIN >=4 RESISTANT Resistant     TETRACYCLINE >=16 RESISTANT Resistant     VANCOMYCIN 1 SENSITIVE Sensitive     TRIMETH/SULFA <=10 SENSITIVE Sensitive     CLINDAMYCIN <=0.25 SENSITIVE Sensitive     RIFAMPIN <=0.5 SENSITIVE Sensitive     Inducible Clindamycin NEGATIVE Sensitive     * RARE STAPHYLOCOCCUS EPIDERMIDIS     No results found.  ASSESSMENT POD 3 S/P I&D, partial 5th ray amputation and debridement of ulceration of L foot.   PLAN Continue Wound VAC at 125 mmHg continuous pressure.  Will change VAC 08/16/2022. Continue IV antibiotics per ID. Pathology pending. May put weight on L heel to transfer to bedside toilet only.  Otherwise, remain NWB L foot.  [name] 08/15/2022, 5:25 PM

## 2022-08-15 NOTE — Progress Notes (Signed)
DOB: Oct 09, 1940 Date: 08/15/2022   Must ID: [id number]   To Whom it May Concern:  Please be advised that the above named patient will require a short-term nursing home stay- anticipated 30 days or less rehabilitation and strengthening. The plan is for return home.

## 2022-08-15 NOTE — Progress Notes (Signed)
Pt repeatedly using call light button for several hours into beginning of shift.  Pt states that we are ignoring him or not paying attention to him.  Advised pt that I have responded to his light several times myself along with additional staff.  Pt also yelling from room and could be heard at nurses station.  Pt only verbalized need during this time is "wanting my foot wrapped."  Repeatedly advised pt that dressing is a wound vac, had been changed during day shift.  Dressing assessed several times, repeatedly placed foot back onto pillow to elevate for comfort, gave pain medicine for comfort.  Pt states, "I can't believe you're just going to leave me in here."  Television placed on game shows for pt comfort, sat with pt when able to during shift while patient was awake.  Left light on in room for comfort.

## 2022-08-15 NOTE — Progress Notes (Signed)
Physical Therapy Treatment Patient Details Name: JEAN SKOW MRN: [id number] DOB: 1941/05/09 Today's Date: 08/15/2022   History of Present Illness Cace Osorto  is a 81 y.o. male, with past medical history of GERD, mood disorder, restless leg syndrome, remote history of seizures (last in 1973), patient was sent by his podiatrist Dr. Caprice Beaver for evaluation of to myelitis, patient with nonhealing ulcer of his left foot, status post surgery at [facility] in August 2023, postoperative course was uneventful, surgical site has healed, he subsequently developed new ulcer behind the region of site, wound cultures were significant for MRSA, for which he was treated with doxycycline, RI was obtained which did show evidence of left foot osteomyelitis of left metatarsal area, with worsening wound, so patient was sent for further evaluation, patient/family denies any fever, chills, but overall reports generalized weakness and frailty.  -In ED no significant lab abnormalities, blood cultures were sent, and patient was started on broad-spectrum IV antibiotics and [facility] hospitalist consulted to admit.    PT Comments    REASSESSMENT: Patient appears more confused today and perseverating on what happened to his foot. Patient able to sit EOB with use of bedrails, stand with min assist, and transfer to chair requiring mod A due to unsteadiness on feet, generalized weakness and poor use of RW requiring verbal cueing for walker placement. Patient with good carryover for weightbearing precautions. Patient tolerated sitting up in chair after therapy - nurse aware. Patient will benefit from continued skilled physical therapy in hospital and recommended venue below to increase strength, balance, endurance for safe ADLs and gait.   Recommendations for follow up therapy are one component of a multi-disciplinary discharge planning process, led by the attending physician.  Recommendations may be updated based on patient status,  additional functional criteria and insurance authorization.  Follow Up Recommendations  Skilled nursing-short term rehab (<3 hours/day) Can patient physically be transported by private vehicle: Yes   Assistance Recommended at Discharge Set up Supervision/Assistance  Patient can return home with the following A little help with walking and/or transfers;A little help with bathing/dressing/bathroom;Help with stairs or ramp for entrance;Assistance with cooking/housework   Equipment Recommendations  None recommended by PT    Recommendations for Other Services       Precautions / Restrictions Precautions Precautions: Fall Restrictions Weight Bearing Restrictions: Yes LLE Weight Bearing: Partial weight bearing LLE Partial Weight Bearing Percentage or Pounds: NWB other than PWB through heel only for transfers     Mobility  Bed Mobility Overal bed mobility: Needs Assistance Bed Mobility: Supine to Sit     Supine to sit: Supervision     General bed mobility comments: use of bedrails, slightly labored movement    Transfers Overall transfer level: Needs assistance Equipment used: Rolling walker (2 wheels) Transfers: Sit to/from Stand, Bed to chair/wheelchair/BSC Sit to Stand: Min assist   Step pivot transfers: Mod assist       General transfer comment: patient requiring min assist to boost from EOB, mod assist to transfer to chair due to generalized weakness and unsteadiness on feet, good carryover w/ precautions but poor carryover with use of RW today requiring verbal cueing for walker placement    Ambulation/Gait Ambulation/Gait assistance: Mod assist Gait Distance (Feet): 3 Feet Assistive device: Rolling walker (2 wheels) Gait Pattern/deviations: Decreased step length - right, Decreased step length - left, Decreased stride length Gait velocity: decreased     General Gait Details: limited to a few unsteady side steps at bedside due to fatigue,  safety concerns with RW  use, and generalized weakness   Stairs             Wheelchair Mobility    Modified Rankin (Stroke Patients Only)       Balance Overall balance assessment: Needs assistance Sitting-balance support: Feet supported, No upper extremity supported Sitting balance-Leahy Scale: Good Sitting balance - Comments: fair/good seated EOB   Standing balance support: Bilateral upper extremity supported, During functional activity, Reliant on assistive device for balance Standing balance-Leahy Scale: Fair Standing balance comment: poor/fair with RW                            Cognition Arousal/Alertness: Lethargic, Awake/alert Behavior During Therapy: Agitated Overall Cognitive Status: Difficult to assess                                 General Comments: recently given Xanax        Exercises      General Comments        Pertinent Vitals/Pain Pain Assessment Pain Assessment: No/denies pain    Home Living                          Prior Function            PT Goals (current goals can now be found in the care plan section) Acute Rehab PT Goals Patient Stated Goal: return home after rehab PT Goal Formulation: With patient Time For Goal Achievement: 08/29/22 Potential to Achieve Goals: Good Progress towards PT goals: Progressing toward goals    Frequency    Min 3X/week      PT Plan Current plan remains appropriate    Co-evaluation              AM-PAC PT "6 Clicks" Mobility   Outcome Measure  Help needed turning from your back to your side while in a flat bed without using bedrails?: None Help needed moving from lying on your back to sitting on the side of a flat bed without using bedrails?: A Little Help needed moving to and from a bed to a chair (including a wheelchair)?: A Little Help needed standing up from a chair using your arms (e.g., wheelchair or bedside chair)?: A Little Help needed to walk in hospital room?:  A Little Help needed climbing 3-5 steps with a railing? : A Lot 6 Click Score: 18    End of Session   Activity Tolerance: Patient tolerated treatment well;Patient limited by fatigue;Patient limited by lethargy Patient left: in chair;with call bell/phone within reach;with chair alarm set Nurse Communication: Mobility status PT Visit Diagnosis: Unsteadiness on feet (R26.81);Other abnormalities of gait and mobility (R26.89);Muscle weakness (generalized) (M62.81)     Time: 2951-8841 PT Time Calculation (min) (ACUTE ONLY): 19 min  Charges:  $Therapeutic Activity: 8-22 mins                     Zigmund Gottron, SPT

## 2022-08-15 NOTE — Progress Notes (Signed)
PROGRESS NOTE    ELIM ECONOMOU  SNK:[id number] DOB: 1940-12-09 DOA: 08/10/2022 PCP: Elyn Aquas    Brief Narrative:  Samuel Olson is a  81 year old male admitted to the hospital with osteomyelitis of left fifth toe and underlying left foot abscess.  Started on IV antibiotics.  Seen by podiatry with plans for operative management 11/10.  Patient is now S/p amputation of fifth metatarsal.   Currently has wound VAC in place.  Continue IV antibiotics.  Pending pathology and cultures to determine final antibiotics per infectious disease team.   Assessment & Plan:   Principal Problem:   Acute osteomyelitis of left foot (HCC) Active Problems:   Chronic constipation   Sleep apnea   Acute urinary retention   Abscess of left foot   Acute left fifth metatarsal osteomyelitis Abscess left foot Cellulitis left lower extremity -Continue on IV antibiotics --pending final pathology and cultures -Podiatry following -Status post operative management -Intraoperative cultures growing staph epi as well as Pseudomonas -Discussed with infectious disease, Dr. Baxter Flattery.  Ceftriaxone changed to cefepime.  Awaiting final operative culture results as well as bone biopsy -Continue wound VAC.  Postop care per podiatry.  Urinary retention -Bladder scan shows residual urinary volume of almost 900 cc -Foley catheter was placed for retention -He was started on Flomax, but discontinued due to soft blood pressures -We will initiate trial of voiding, discontinue catheter 08/15/2022  Constipation -Start on frequent MiraLAX doses -Adding Senokot -Patient did have a bowel movement after enema  Hyperlipidemia -Continue statin  Mood disorder -Continue Depakote -As needed Xanax  OSA -Continue on CPAP   DVT prophylaxis: enoxaparin (LOVENOX) injection 40 mg Start: 08/13/22 1000  Code Status: DNR Family Communication: No family at the bedside Disposition Plan: Status is: Inpatient Remains inpatient  appropriate because: Needs operative management of osteomyelitis and subsequent placement to skilled nursing facility  Likely patient to return home with home health in 2-3 days pending determination of IV versus p.o. antibiotics depending on cultures   Consultants:  Podiatry  Procedures:  Incision and drainage of abscess, left foot 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, left foot Excisional debridement of ulceration, left foot Application of KCI Wound VAC, left foot  Antimicrobials:  Vancomycin Ceftriaxone DC'd 11/12 Cefepime   Subjective: Patient had a bowel movement last night after enema.  Does not have any other new complaints today.  Objective: Vitals:   08/14/22 0516 08/14/22 1300 08/14/22 2112 08/15/22 0524  BP: (!) 97/56 (!) 100/55 98/63 (!) 99/55  Pulse: 72 75 80 64  Resp: '18 18 18 17  '$ Temp: 97.8 F (36.6 C) 98.1 F (36.7 C) 98.2 F (36.8 C) 97.6 F (36.4 C)  TempSrc:  Oral Oral Oral  SpO2: 94% 96% 96% 95%  Weight:  63 kg    Height:        Intake/Output Summary (Last 24 hours) at 08/15/2022 1115 Last data filed at 08/15/2022 0705 Gross per 24 hour  Intake 960 ml  Output 550 ml  Net 410 ml   Filed Weights   08/10/22 1446 08/14/22 1300  Weight: 68 kg 63 kg      Physical Exam:   General:  AAO x 3,  cooperative, no distress;   HEENT:  Normocephalic, PERRL, otherwise with in Normal limits   Neuro:  CNII-XII intact. , normal motor and sensation, reflexes intact   Lungs:   Clear to auscultation BL, Respirations unlabored,  No wheezes / crackles  Cardio:    S1/S2, RRR, No murmure, No Rubs  or Gallops   Abdomen:  Soft, non-tender, bowel sounds active all four quadrants, no guarding or peritoneal signs.  Muscular  skeletal:  Amputated left fifth digit of the left foot Left foot is wrapped in dressing with wound VAC Limited exam -global generalized weaknesses - in bed, able to move all 4 extremities,   2+ pulses,  symmetric, No pitting edema  Skin:  Dry, warm  to touch, left foot lateral surgical wound  Wounds: Please see nursing documentation         Data Reviewed: I have personally reviewed following labs and imaging studies  CBC: Recent Labs  Lab 08/10/22 1720 08/11/22 0432 08/13/22 0530 08/15/22 0510  WBC 7.3 7.1 6.6 5.3  NEUTROABS 5.8  --   --   --   HGB 13.7 11.5* 11.9* 11.1*  HCT 42.9 36.6* 36.8* 34.6*  MCV 100.9* 101.1* 99.7 100.3*  PLT 198 182 162 500   Basic Metabolic Panel: Recent Labs  Lab 08/10/22 1720 08/11/22 0432 08/12/22 0420 08/13/22 0530 08/15/22 0510  NA 140 140  --  139 139  K 4.2 4.2  --  4.3 4.3  CL 102 105  --  102 104  CO2 30 31  --  31 30  GLUCOSE 102* 94  --  96 97  BUN 32* 31*  --  19 28*  CREATININE 0.70 0.66 0.57* 0.62 0.64  CALCIUM 9.0 8.5*  --  8.5* 8.3*   GFR: Estimated Creatinine Clearance: 64.5 mL/min (by C-G formula based on SCr of 0.64 mg/dL). Liver Function Tests: Recent Labs  Lab 08/10/22 1720  AST 26  ALT 20  ALKPHOS 88  BILITOT 0.4  PROT 7.9  ALBUMIN 3.8    CBG: Recent Labs  Lab 08/12/22 1444  GLUCAP 82     Recent Results (from the past 240 hour(s))  Culture, blood (Routine X 2) w Reflex to ID Panel     Status: None   Collection Time: 08/10/22  9:59 PM   Specimen: Right Antecubital; Blood  Result Value Ref Range Status   Specimen Description RIGHT ANTECUBITAL  Final   Special Requests   Final    BOTTLES DRAWN AEROBIC AND ANAEROBIC Blood Culture adequate volume   Culture   Final    NO GROWTH 5 DAYS Performed at [facility], [address] [place], [place] [address]    Report Status 08/15/2022 FINAL  Final  Culture, blood (Routine X 2) w Reflex to ID Panel     Status: None   Collection Time: 08/10/22 10:00 PM   Specimen: BLOOD RIGHT HAND  Result Value Ref Range Status   Specimen Description BLOOD RIGHT HAND  Final   Special Requests   Final    BOTTLES DRAWN AEROBIC AND ANAEROBIC Blood Culture adequate volume   Culture   Final    NO GROWTH 5  DAYS Performed at [facility], [address] [place], [place] [address]    Report Status 08/15/2022 FINAL  Final  Surgical PCR screen     Status: None   Collection Time: 08/12/22  6:16 AM   Specimen: Nasal Mucosa; Nasal Swab  Result Value Ref Range Status   MRSA, PCR NEGATIVE NEGATIVE Final   Staphylococcus aureus NEGATIVE NEGATIVE Final    Comment: (NOTE) The Xpert SA Assay (FDA approved for NASAL specimens in patients 60 years of age and older), is one component of a comprehensive surveillance program. It is not intended to diagnose infection nor to guide or monitor treatment. Performed at  [facility], [address], [place], [place] [address]   Aerobic/Anaerobic Culture w Gram Stain (surgical/deep wound)     Status: None (Preliminary result)   Collection Time: 08/12/22  1:42 PM   Specimen: Soft Tissue, Other  Result Value Ref Range Status   Specimen Description   Final    TISSUE SOFT Performed at [facility], [address], [place], [place] [address]    Special Requests   Final    FOOT LEFT INCISION AND DRAINAGE Performed at [facility], [address] [place], [place] [address]    Gram Stain   Final    NO WBC SEEN RARE GRAM POSITIVE COCCI IN PAIRS Performed at [facility], [address] [place], [place] [address]    Culture   Final    RARE STAPHYLOCOCCUS EPIDERMIDIS RARE PSEUDOMONAS AERUGINOSA SUSCEPTIBILITIES TO FOLLOW NO ANAEROBES ISOLATED; CULTURE IN PROGRESS FOR 5 DAYS    Report Status PENDING  Incomplete         Radiology Studies: No results found.      Scheduled Meds:  acidophilus  1 capsule Oral TID   aspirin EC  81 mg Oral Daily   Chlorhexidine Gluconate Cloth  6 each Topical Daily   cholecalciferol  5,000 Units Oral q morning   divalproex  125 mg Oral BID   enoxaparin (LOVENOX) injection  40 mg Subcutaneous Q24H   feeding supplement  237 mL Oral BID BM   melatonin  6 mg Oral QHS   multivitamin with minerals  1 tablet Oral Daily    mupirocin ointment  1 Application Nasal BID   nutrition supplement (JUVEN)  1 packet Oral BID BM   pantoprazole  40 mg Oral Daily   polycarbophil  1,250 mg Oral Daily   polyethylene glycol  17 g Oral TID   pramipexole  1 mg Oral TID   Continuous Infusions:  ceFEPime (MAXIPIME) IV Stopped (08/15/22 0646)   DAPTOmycin (CUBICIN) 500 mg in sodium chloride 0.9 % IVPB Stopped (08/14/22 2200)     LOS: 5 days    Time spent: 36mns  SDeatra James MD [facility]   If 7PM-7AM, please contact night-coverage [email or web address]  08/15/2022, 11:15 AM

## 2022-08-15 NOTE — NC FL2 (Signed)
[place] LEVEL OF CARE SCREENING TOOL     IDENTIFICATION  Patient Name: Samuel Olson Birthdate: 1940-11-04 Sex: male Admission Date (Current Location): 08/10/2022  [facility] and [place] Number:  [employer] and Address:  [place] [address] [place]      Provider Number: [id number]  Attending Physician Name and Address:  Deatra James, MD  Relative Name and Phone Number:       Current Level of Care: Hospital Recommended Level of Care: [place] Prior Approval Number:    Date Approved/Denied:   PASRR Number: pending  Discharge Plan: SNF    Current Diagnoses: Patient Active Problem List   Diagnosis Date Noted   Acute urinary retention 08/14/2022   Abscess of left foot 08/14/2022   Acute osteomyelitis of left foot ([place]) 08/10/2022   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   Abnormal barium swallow 06/11/2022   Oropharyngeal dysphagia 06/11/2022   Unintentional weight loss 06/11/2022   Fecal impaction ([place]) 06/11/2022   Other constipation 06/11/2022   Skin rash 06/11/2022   Anorexia 06/11/2022   Frequent falls 03/14/2022   Demand ischemia 11/14/2021   RLS (restless legs syndrome) 11/14/2021   HLD (hyperlipidemia) 11/14/2021   CAP (community acquired pneumonia) 11/13/2021   Sleep apnea 11/13/2021   Chronic constipation 07/17/2021   Dyspepsia 07/17/2021   Gastroesophageal reflux disease 03/22/2020   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   Generalized abdominal pain 06/28/2019   Abdominal cramping 06/28/2019   History of colonic polyps 06/28/2019   Biliary obstruction 11/30/2018   Choledocholithiasis 11/30/2018   Seizures ([place]) 11/29/2018   Anxiety 11/29/2018   Elevated LFTs 11/29/2018   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 See d/c summary for updates.)  AMBULATORY STATUS COMMUNICATION OF NEEDS Skin   Extensive Assist Verbally Skin abrasions, Bruising, Surgical wounds, Wound Vac                       Personal Care Assistance Level of Assistance  Bathing, Feeding, Dressing Bathing Assistance: Maximum assistance Feeding assistance: Limited assistance Dressing Assistance: Maximum assistance     Functional Limitations Info  Sight, Hearing, Speech Sight Info: Impaired Hearing Info: Adequate Speech Info: Adequate    SPECIAL CARE FACTORS FREQUENCY  PT (By licensed PT)     PT Frequency: 5x weekly              Contractures      Additional Factors Info  Code Status, Allergies, Psychotropic Code Status Info: DNR Allergies Info: Phenergan (promethazine), Augmentin (amoxicillin-pot Clavulanate), Oxycodone, Percocet (oxycodone-acetaminophen) Psychotropic Info: Depakote         Current Medications (08/15/2022):  This is the current hospital active medication list Current Facility-Administered Medications  Medication Dose Route Frequency Provider Last Rate Last Admin   acidophilus (RISAQUAD) capsule 1 capsule  1 capsule Oral TID Skipper Cliche A, MD   1 capsule at 08/15/22 0814   ALPRAZolam Duanne Moron) tablet 0.5 mg  0.5 mg Oral TID PRN Skipper Cliche A, MD   0.5 mg at 08/15/22 1027   aspirin EC tablet 81 mg  81  mg Oral Daily Caprice Beaver, DPM   81 mg at 08/15/22 7846   ceFEPIme (MAXIPIME) 2 g in sodium chloride 0.9 % 100 mL IVPB  2 g Intravenous Q8H Kathie Dike, MD   Stopped at 08/15/22 0646   Chlorhexidine Gluconate Cloth 2 % PADS 6 each  6 each Topical Daily Kathie Dike, MD   6 each at 08/14/22 9629   cholecalciferol (VITAMIN D3) 25 MCG  (1000 UNIT) tablet 5,000 Units  5,000 Units Oral q morning Caprice Beaver, DPM   5,000 Units at 08/15/22 0815   DAPTOmycin (CUBICIN) 500 mg in sodium chloride 0.9 % IVPB  8 mg/kg Intravenous Q2000 Carlyle Basques, MD   Stopped at 08/14/22 2200   divalproex (DEPAKOTE SPRINKLE) capsule 125 mg  125 mg Oral BID Caprice Beaver, DPM   125 mg at 08/15/22 0817   enoxaparin (LOVENOX) injection 40 mg  40 mg Subcutaneous Q24H Kathie Dike, MD   40 mg at 08/15/22 0818   feeding supplement (ENSURE ENLIVE / ENSURE PLUS) liquid 237 mL  237 mL Oral BID BM Kathie Dike, MD   237 mL at 08/14/22 1938   fluticasone (FLONASE) 50 MCG/ACT nasal spray 1 spray  1 spray Each Nare Daily PRN Caprice Beaver, DPM       hydrALAZINE (APRESOLINE) injection 5 mg  5 mg Intravenous Q6H PRN Caprice Beaver, DPM       HYDROcodone-acetaminophen (NORCO) 10-325 MG per tablet 1 tablet  1 tablet Oral Q6H PRN Caprice Beaver, DPM   1 tablet at 08/15/22 0817   melatonin tablet 6 mg  6 mg Oral QHS Caprice Beaver, DPM   6 mg at 08/14/22 2249   morphine (PF) 2 MG/ML injection 2 mg  2 mg Intravenous Q3H PRN Shahmehdi, Erling Conte A, MD       multivitamin with minerals tablet 1 tablet  1 tablet Oral Daily Kathie Dike, MD   1 tablet at 08/15/22 0816   mupirocin ointment (BACTROBAN) 2 % 1 Application  1 Application Nasal BID Caprice Beaver, DPM   1 Application at 52/84/13 2440   nutrition supplement (JUVEN) (JUVEN) powder packet 1 packet  1 packet Oral BID BM Kathie Dike, MD   1 packet at 08/15/22 0819   Oral care mouth rinse  15 mL Mouth Rinse PRN Caprice Beaver, DPM       pantoprazole (PROTONIX) EC tablet 40 mg  40 mg Oral Daily Caprice Beaver, DPM   40 mg at 08/15/22 0816   polycarbophil (FIBERCON) tablet 1,250 mg  1,250 mg Oral Daily Caprice Beaver, DPM   1,250 mg at 08/15/22 0815   polyethylene glycol (MIRALAX / GLYCOLAX) packet 17 g  17 g Oral Daily PRN Caprice Beaver, DPM   17 g at 08/13/22 0935    polyethylene glycol (MIRALAX / GLYCOLAX) packet 17 g  17 g Oral TID Kathie Dike, MD   17 g at 08/15/22 0819   pramipexole (MIRAPEX) tablet 1 mg  1 mg Oral TID Caprice Beaver, DPM   1 mg at 08/15/22 1027     Discharge Medications: Please see discharge summary for a list of discharge medications.  Relevant Imaging Results:  Relevant Lab Results:   Additional Information SSN: [id number].   May need IV antibiotics.  Salome Arnt, LCSW

## 2022-08-16 DIAGNOSIS — M86172 Other acute osteomyelitis, left ankle and foot: Secondary | ICD-10-CM | POA: Diagnosis not present

## 2022-08-16 LAB — CBC
HCT: 36.8 % — ABNORMAL LOW (ref 39.0–52.0)
Hemoglobin: 11.7 g/dL — ABNORMAL LOW (ref 13.0–17.0)
MCH: 32.1 pg (ref 26.0–34.0)
MCHC: 31.8 g/dL (ref 30.0–36.0)
MCV: 100.8 fL — ABNORMAL HIGH (ref 80.0–100.0)
Platelets: 170 10*3/uL (ref 150–400)
RBC: 3.65 MIL/uL — ABNORMAL LOW (ref 4.22–5.81)
RDW: 13.2 % (ref 11.5–15.5)
WBC: 5.5 10*3/uL (ref 4.0–10.5)
nRBC: 0 % (ref 0.0–0.2)

## 2022-08-16 MED ORDER — ORAL CARE MOUTH RINSE: 15.0000 mL | OROMUCOSAL | 0 refills | Status: AC | PRN

## 2022-08-16 MED ORDER — MUPIROCIN 2 % EX OINT: 1.0000 | TOPICAL_OINTMENT | Freq: Two times a day (BID) | CUTANEOUS | 0 refills | Status: DC

## 2022-08-16 MED ORDER — SENNOSIDES-DOCUSATE SODIUM 8.6-50 MG PO TABS: 1.0000 | ORAL_TABLET | Freq: Two times a day (BID) | ORAL | 0 refills | Status: DC

## 2022-08-16 MED ORDER — JUVEN PO PACK: 1.0000 | PACK | Freq: Two times a day (BID) | ORAL | 0 refills | Status: AC

## 2022-08-16 MED ORDER — ENSURE ENLIVE PO LIQD: 237.0000 mL | Freq: Two times a day (BID) | ORAL | 12 refills | Status: DC

## 2022-08-16 MED ORDER — RISAQUAD PO CAPS: 1.0000 | ORAL_CAPSULE | Freq: Three times a day (TID) | ORAL | 0 refills | Status: AC

## 2022-08-16 NOTE — Progress Notes (Signed)
Pt has not voided this shift. Nurse on previous shift stated foley catheter was removed around 1000 and had voided only a small amount since. Bladder scanner read >200. Pt states he feels like he needs to urinate but unable to, MD notified.  Straight cath was done via sterile technique with Sadie, NT, 550 ml golden yellow urine obtained. MD notified.

## 2022-08-16 NOTE — Progress Notes (Signed)
PROGRESS NOTE    [name]  ASN:[id number] DOB: 1941/03/27 DOA: 08/10/2022 PCP: Elyn Aquas    Subjective: Patient was seen and examined this morning, stable no acute distress-expressing distrust of medications being given by nursing staff.  No issues overnight medically stable   Brief Narrative:  Samuel Olson is a  81 year old male admitted to the hospital with osteomyelitis of left fifth toe and underlying left foot abscess.  Started on IV antibiotics.  Seen by podiatry with plans for operative management 11/10.  Patient is now S/p amputation of fifth metatarsal.   Currently has wound VAC in place.  Continue IV antibiotics.  Pending pathology and cultures to determine final antibiotics per infectious disease team.   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 Baxter Flattery. ... Dr. Juleen China following--recommending continuing cefepime/daptomycin -Awaiting final path report  -Awaiting final operative culture results as well as bone biopsy -Continue wound VAC.  Postop care per podiatry.  Urinary retention -Bladder scan shows residual urinary volume of almost 900 cc -Foley catheter was placed for retention --was discontinued 08/15/2022 Monitoring for urinary retention  -He was started on Flomax, but discontinued due to soft blood pressures   Constipation - on frequent MiraLAX doses -Adding Senokot -Patient did have a bowel movement after  enema  Hyperlipidemia -Continue statin  Mood disorder -Continue Depakote -As needed Xanax  OSA -Continue on CPAP   DVT prophylaxis: enoxaparin (LOVENOX) injection 40 mg Start: 08/13/22 1000  Code Status: DNR Family Communication: No family at the bedside Disposition Plan: Status is: Inpatient Remains inpatient appropriate because: Needs operative management of osteomyelitis and subsequent placement to skilled nursing facility  -Disposition: From home-planning to discharge to SNF within 1-2 days with a final antibiotic recommendations  If IV route-then patient will need a PICC line   Consultants:  Podiatry  Procedures:  Incision and drainage of abscess, left foot 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, left foot Excisional debridement of ulceration, left foot Application of KCI Wound VAC, left foot  Antimicrobials:  Vancomycin - DCed ceftriaxone DC'd 11/12 Cefepime Daptomycin   Subjective: Patient had a bowel movement last night after enema.  Does not have any other new complaints today.  Objective: Vitals:   08/15/22 0524 08/15/22 1440 08/15/22 2104 08/16/22 0523  BP: (!) 99/55 112/70 117/73 113/73  Pulse: 64 61 (!) 59 69  Resp: '17 20 18 17  '$ Temp: 97.6 F (36.4 C) 97.6 F (36.4 C) 97.7 F (36.5 C) (!) 97.5 F (36.4 C)  TempSrc: Oral  Oral Oral  SpO2: 95% 98% 98% 95%  Weight:      Height:        Intake/Output Summary (Last 24 hours) at 08/16/2022 1155 Last data filed at 08/16/2022 0900 Gross per 24 hour  Intake 640 ml  Output 550 ml  Net 90 ml   Filed Weights   08/10/22 1446 08/14/22 1300  Weight: 68 kg 63 kg       Physical Exam:   General:  AAO x 3,  cooperative, no  distress;   HEENT:  Normocephalic, PERRL, otherwise with in Normal limits   Neuro:  CNII-XII intact. , normal motor and sensation, reflexes intact   Lungs:   Clear to auscultation BL, Respirations unlabored,  No wheezes / crackles  Cardio:    S1/S2, RRR, No murmure, No Rubs or Gallops    Abdomen:  Soft, non-tender, bowel sounds active all four quadrants, no guarding or peritoneal signs.  Muscular  skeletal:  Amputated left fifth digit of the left foot Left foot is wrapped in dressing with wound VAC Limited exam - sever global generalized weaknesses - in bed, able to move all 4 extremities,   2+ pulses,  symmetric, No pitting edema  Skin:  Dry, warm to touch, negative for any Rashes,  Wounds: Please see nursing documentation           Data Reviewed: I have personally reviewed following labs and imaging studies  CBC: Recent Labs  Lab 08/10/22 1720 08/11/22 0432 08/13/22 0530 08/15/22 0510 08/16/22 0413  WBC 7.3 7.1 6.6 5.3 5.5  NEUTROABS 5.8  --   --   --   --   HGB 13.7 11.5* 11.9* 11.1* 11.7*  HCT 42.9 36.6* 36.8* 34.6* 36.8*  MCV 100.9* 101.1* 99.7 100.3* 100.8*  PLT 198 182 162 154 947   Basic Metabolic Panel: Recent Labs  Lab 08/10/22 1720 08/11/22 0432 08/12/22 0420 08/13/22 0530 08/15/22 0510  NA 140 140  --  139 139  K 4.2 4.2  --  4.3 4.3  CL 102 105  --  102 104  CO2 30 31  --  31 30  GLUCOSE 102* 94  --  96 97  BUN 32* 31*  --  19 28*  CREATININE 0.70 0.66 0.57* 0.62 0.64  CALCIUM 9.0 8.5*  --  8.5* 8.3*   GFR: Estimated Creatinine Clearance: 64.5 mL/min (by C-G formula based on SCr of 0.64 mg/dL). Liver Function Tests: Recent Labs  Lab 08/10/22 1720  AST 26  ALT 20  ALKPHOS 88  BILITOT 0.4  PROT 7.9  ALBUMIN 3.8    CBG: Recent Labs  Lab 08/12/22 1444  GLUCAP 82     Recent Results (from the past 240 hour(s))  Culture, blood (Routine X 2) w Reflex to ID Panel     Status: None   Collection Time: 08/10/22  9:59 PM   Specimen: Right Antecubital; Blood  Result Value Ref Range Status   Specimen Description RIGHT ANTECUBITAL  Final   Special Requests   Final    BOTTLES DRAWN AEROBIC AND ANAEROBIC Blood Culture adequate volume   Culture   Final    NO GROWTH 5 DAYS Performed at [facility], [address],  [place], [place] [address]    Report Status 08/15/2022 FINAL  Final  Culture, blood (Routine X 2) w Reflex to ID Panel     Status: None   Collection Time: 08/10/22 10:00 PM   Specimen: BLOOD RIGHT HAND  Result Value Ref Range Status   Specimen Description BLOOD RIGHT HAND  Final   Special Requests   Final    BOTTLES DRAWN AEROBIC AND ANAEROBIC Blood Culture adequate volume   Culture   Final    NO GROWTH 5 DAYS Performed at [facility], [address] [place],  [address]    Report Status 08/15/2022 FINAL  Final  Surgical PCR screen     Status: None   Collection Time: 08/12/22  6:16 AM   Specimen: Nasal Mucosa; Nasal Swab  Result Value Ref Range Status   MRSA, PCR NEGATIVE NEGATIVE Final   Staphylococcus aureus NEGATIVE NEGATIVE Final    Comment: (NOTE) The Xpert SA Assay (FDA approved for NASAL specimens in patients 47 years of age and older), is one component of a comprehensive surveillance program. It is not intended to diagnose infection nor to guide or monitor treatment. Performed at [facility], [address], [place], [place] [address]   Aerobic/Anaerobic Culture w Gram Stain (surgical/deep wound)     Status: None (Preliminary result)   Collection Time: 08/12/22  1:42 PM   Specimen: Soft Tissue, Other  Result Value Ref Range Status   Specimen Description TISSUE SOFT  Final   Special Requests FOOT LEFT INCISION AND DRAINAGE  Final   Gram Stain NO WBC SEEN RARE GRAM POSITIVE COCCI IN PAIRS   Final   Culture   Final    RARE STAPHYLOCOCCUS EPIDERMIDIS RARE PSEUDOMONAS AERUGINOSA NO ANAEROBES ISOLATED; CULTURE IN PROGRESS FOR 5 DAYS    Report Status PENDING  Incomplete   Organism ID, Bacteria STAPHYLOCOCCUS EPIDERMIDIS  Final   Organism ID, Bacteria PSEUDOMONAS AERUGINOSA  Final      Susceptibility   Pseudomonas aeruginosa - MIC*    CEFTAZIDIME 4 SENSITIVE Sensitive     CIPROFLOXACIN <=0.25 SENSITIVE Sensitive     GENTAMICIN <=1 SENSITIVE Sensitive     IMIPENEM 1  SENSITIVE Sensitive     PIP/TAZO 8 SENSITIVE Sensitive     CEFEPIME 2 SENSITIVE Sensitive     LEVOFLOXACIN Value in next row Sensitive      SENSITIVE0.25Performed at [facility], [place] [address], [place], [place] [address]    * RARE PSEUDOMONAS AERUGINOSA   Staphylococcus epidermidis - MIC*    CIPROFLOXACIN Value in next row Sensitive      SENSITIVE0.25Performed at [facility], [place] [address], [place], [place] [address]    ERYTHROMYCIN Value in next row Resistant      SENSITIVE0.25Performed at [facility], [place] [address], [place], [place] [address]    GENTAMICIN Value in next row Sensitive      SENSITIVE0.25Performed at [facility], [place] [address] [place], [place] [address]    OXACILLIN Value in next row Resistant      SENSITIVE0.25Performed at [facility], [place] [address], [place], [place] [address]    TETRACYCLINE Value in next row Resistant      SENSITIVE0.25Performed at [facility], [place] [address], [place], [place] [address]    VANCOMYCIN Value in next row Sensitive      SENSITIVE0.25Performed at [facility], [place] [address], [place], [place] [address]    TRIMETH/SULFA Value in next row Sensitive      SENSITIVE0.25Performed at [facility], [place] [address] [place], [place] [address]    CLINDAMYCIN Value in next row Sensitive      SENSITIVE0.25Performed at [facility], [place] [address] [place], [place] [address]    RIFAMPIN Value in next row Sensitive      SENSITIVE0.25Performed at [facility], [place] [address] [place], [place] [address]    Inducible Clindamycin Value in next row Sensitive      SENSITIVE0.25Performed at [facility], [place] [address] [place], [place] [address]    * RARE STAPHYLOCOCCUS EPIDERMIDIS         Radiology Studies: No results found.      Scheduled Meds:  acidophilus  1 capsule Oral  TID   aspirin EC  81 mg Oral Daily   Chlorhexidine Gluconate Cloth  6 each Topical Daily    cholecalciferol  5,000 Units Oral q morning   divalproex  125 mg Oral BID   enoxaparin (LOVENOX) injection  40 mg Subcutaneous Q24H   feeding supplement  237 mL Oral BID BM   melatonin  6 mg Oral QHS   multivitamin with minerals  1 tablet Oral Daily   mupirocin ointment  1 Application Nasal BID   nutrition supplement (JUVEN)  1 packet Oral BID BM   pantoprazole  40 mg Oral Daily   polycarbophil  1,250 mg Oral Daily   polyethylene glycol  17 g Oral TID   pramipexole  1 mg Oral TID   senna-docusate  1 tablet Oral BID   Continuous Infusions:  ceFEPime (MAXIPIME) IV 2 g (08/16/22 0509)   DAPTOmycin (CUBICIN) 500 mg in sodium chloride 0.9 % IVPB Stopped (08/15/22 2031)     LOS: 6 days    Time spent: 57mns  SDeatra James MD [facility] > 50 min was spent seeing examining patient, reviewing labs medication, medical records, drawn plan of care Discussing with patient and consultants  If 7PM-7AM, please contact night-coverage [email or web address]  08/16/2022, 11:55 AM

## 2022-08-16 NOTE — Progress Notes (Signed)
[facility] for Infectious Disease  Date of Admission:  08/10/2022           Reason for visit: Follow up on osteomyelitis   Current antibiotics: Daptomycin Cefepime  ASSESSMENT:    81 y.o. 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  Podiatry following.  Surgical path pending to assess for clear margins although I think challenging to discern truly if all osteomyelitic bone has been resected.   RECOMMENDATIONS:    Continue cefepime and daptomycin Plan for 4 weeks of IV therapy from date of surgery through 09/09/22 This can be followed by 2 weeks of oral therapy with linezolid and levaquin (we confirmed susceptibility) to complete 6 weeks of highly bioavailable therapy Will need a PICC line ordered/placed See OPAT note.  Will sign off, please call as needed  Diagnosis: OSteomyelitis  Culture Result: MRSE, PsA  Allergies  Allergen Reactions   Phenergan [Promethazine] Other (See Comments)    Cramps, shakes.   Augmentin [Amoxicillin-Pot Clavulanate] Other (See Comments)    UNKNOWN [SEE BELOW]  Has patient had a PCN reaction causing immediate rash, facial/tongue/throat swelling, SOB or lightheadedness with hypotension: Unknown Has patient had a PCN reaction causing severe rash involving mucus membranes or skin necrosis: Uknown PATIENT HAS HAD A PCN REACTION THAT REQUIRED HOSPITALIZATION: >> UNSPECIFIED REACTION WHILE HE WAS ALREADY ADMITTED TO A HOSPITAL.  Has patient had a PCN reaction occurring within the last 10 years: No    Oxycodone Other (See Comments)    UNSPECIFIED REACTION  TOLERATES APAP WITHOUT OXYCODONE   Percocet [Oxycodone-Acetaminophen] Other (See Comments)    Unknown. Tolerates acetaminophen alone.    OPAT Orders Discharge antibiotics to be given via PICC line Discharge antibiotics: Per pharmacy protocol    Cefepime Daptomycin  Duration: 4 weeks  End Date: 09/09/22  [facility] Care Per Protocol:  Home health RN for IV administration and teaching; PICC line care and labs.    Labs weekly while on IV antibiotics: _xxx_ CBC with differential __ BMP _xxx_ CMP _xxx_ CRP _xxxx_ ESR __ Vancomycin trough __xxx CK  _xxx_ Please pull PIC at completion of IV antibiotics __ Please leave PIC in place until doctor has seen patient or been notified  Fax weekly labs to [phone]  Clinic Follow Up Appt: 09/05/22 at 3:30 with Dr Madelon Lips for [place] [phone] pager 08/16/2022, 5:06 PM

## 2022-08-16 NOTE — TOC Progression Note (Addendum)
Transition of Care [facility]) - Progression Note    Patient Details  Name: Samuel Olson MRN: [id number] Date of Birth: 10/17/1940  [facility]) CM/SW Contact  Ihor Gully, LCSW Phone Number: 08/16/2022, 3:56 PM  Clinical Narrative:    Message left for Eddie at [facility] requesting return contact regarding referral. Spouse notified of denials and pending for [facility].    Expected Discharge Plan: [place] Barriers to Discharge: Continued Medical Work up  Expected Discharge Plan and Services Expected Discharge Plan: [place] In-house Referral: Clinical Social Work   Post Acute Care Choice: [place] Living arrangements for the past 2 months: Single Family Home                                       Social Determinants of Health (SDOH) Interventions    Readmission Risk Interventions     No data to display

## 2022-08-16 NOTE — Progress Notes (Signed)
PODIATRY PROGRESS NOTE  SUBJECTIVE Samuel Olson POD 4 following I&D, partial 5th ray amputation and debridement of ulceration of L foot.  Pain controlled.    MEDICATIONS Scheduled Meds:  acidophilus  1 capsule Oral TID   aspirin EC  81 mg Oral Daily   Chlorhexidine Gluconate Cloth  6 each Topical Daily   cholecalciferol  5,000 Units Oral q morning   divalproex  125 mg Oral BID   enoxaparin (LOVENOX) injection  40 mg Subcutaneous Q24H   feeding supplement  237 mL Oral BID BM   melatonin  6 mg Oral QHS   multivitamin with minerals  1 tablet Oral Daily   mupirocin ointment  1 Application Nasal BID   nutrition supplement (JUVEN)  1 packet Oral BID BM   pantoprazole  40 mg Oral Daily   polycarbophil  1,250 mg Oral Daily   polyethylene glycol  17 g Oral TID   pramipexole  1 mg Oral TID   senna-docusate  1 tablet Oral BID   Continuous Infusions:  ceFEPime (MAXIPIME) IV 2 g (08/16/22 0509)   DAPTOmycin (CUBICIN) 500 mg in sodium chloride 0.9 % IVPB Stopped (08/15/22 2031)   PRN Meds:.ALPRAZolam, fluticasone, HYDROcodone-acetaminophen, morphine injection, mouth rinse, polyethylene glycol  OBJECTIVE Vital signs in last 24 hours:   Temp:  [97.5 F (36.4 C)-97.7 F (36.5 C)] 97.5 F (36.4 C) (11/14 0523) Pulse Rate:  [59-69] 69 (11/14 0523) Resp:  [17-20] 17 (11/14 0523) BP: (112-117)/(70-73) 113/73 (11/14 0523) SpO2:  [95 %-98 %] 95 % (11/14 0523)  GENERAL:  Alert, NAD.  L foot elevated on pillow. EQUIPMENT:  Wound VAC in place and operational at 125 mmHg continuous pressure. DRESSING:  Intact without strikethrough. INTEGUMENT:  Incision remains well approximated with sutures intact.  Mild periwound rubor.  Ulceration plantar L foot granular.  No necrosis present. VASCULAR:  L DP pulse palpable.  L PT pulse palpable.  Capillary refill brisk to remaining digits of L foot. MUSCULOSKELETAL:  Calves soft, NT.  LAB/TEST RESULTS  Recent Labs    08/15/22 0510 08/16/22 0413  WBC 5.3  5.5  HGB 11.1* 11.7*  HCT 34.6* 36.8*  PLT 154 170  NA 139  --   K 4.3  --   CL 104  --   CO2 30  --   BUN 28*  --   CREATININE 0.64  --   GLUCOSE 97  --   CALCIUM 8.3*  --      Recent Results (from the past 240 hour(s))  Culture, blood (Routine X 2) w Reflex to ID Panel     Status: None   Collection Time: 08/10/22  9:59 PM   Specimen: Right Antecubital; Blood  Result Value Ref Range Status   Specimen Description RIGHT ANTECUBITAL  Final   Special Requests   Final    BOTTLES DRAWN AEROBIC AND ANAEROBIC Blood Culture adequate volume   Culture   Final    NO GROWTH 5 DAYS Performed at [facility], [address] [place], [place] [address]    Report Status 08/15/2022 FINAL  Final  Culture, blood (Routine X 2) w Reflex to ID Panel     Status: None   Collection Time: 08/10/22 10:00 PM   Specimen: BLOOD RIGHT HAND  Result Value Ref Range Status   Specimen Description BLOOD RIGHT HAND  Final   Special Requests   Final    BOTTLES DRAWN AEROBIC AND ANAEROBIC Blood Culture adequate volume   Culture   Final  NO GROWTH 5 DAYS Performed at [facility], [address] [place], [place] [address]    Report Status 08/15/2022 FINAL  Final  Surgical PCR screen     Status: None   Collection Time: 08/12/22  6:16 AM   Specimen: Nasal Mucosa; Nasal Swab  Result Value Ref Range Status   MRSA, PCR NEGATIVE NEGATIVE Final   Staphylococcus aureus NEGATIVE NEGATIVE Final    Comment: (NOTE) The Xpert SA Assay (FDA approved for NASAL specimens in patients 41 years of age and older), is one component of a comprehensive surveillance program. It is not intended to diagnose infection nor to guide or monitor treatment. Performed at [facility], [address] [place], [place] [address]   Aerobic/Anaerobic Culture w Gram Stain (surgical/deep wound)     Status: None (Preliminary result)   Collection Time: 08/12/22  1:42 PM   Specimen: Soft Tissue, Other  Result Value Ref Range Status    Specimen Description TISSUE SOFT  Final   Special Requests FOOT LEFT INCISION AND DRAINAGE  Final   Gram Stain NO WBC SEEN RARE GRAM POSITIVE COCCI IN PAIRS   Final   Culture   Final    RARE STAPHYLOCOCCUS EPIDERMIDIS RARE PSEUDOMONAS AERUGINOSA NO ANAEROBES ISOLATED; CULTURE IN PROGRESS FOR 5 DAYS    Report Status PENDING  Incomplete   Organism ID, Bacteria STAPHYLOCOCCUS EPIDERMIDIS  Final   Organism ID, Bacteria PSEUDOMONAS AERUGINOSA  Final      Susceptibility   Pseudomonas aeruginosa - MIC*    CEFTAZIDIME 4 SENSITIVE Sensitive     CIPROFLOXACIN <=0.25 SENSITIVE Sensitive     GENTAMICIN <=1 SENSITIVE Sensitive     IMIPENEM 1 SENSITIVE Sensitive     PIP/TAZO 8 SENSITIVE Sensitive     CEFEPIME 2 SENSITIVE Sensitive     LEVOFLOXACIN Value in next row Sensitive      SENSITIVE0.25Performed at [facility], [place] [address], [place], [place] [address]    * RARE PSEUDOMONAS AERUGINOSA   Staphylococcus epidermidis - MIC*    CIPROFLOXACIN Value in next row Sensitive      SENSITIVE0.25Performed at [facility], [place] [address] [place], [place] [address]    ERYTHROMYCIN Value in next row Resistant      SENSITIVE0.25Performed at [facility], [place] [address], [place], [place] [address]    GENTAMICIN Value in next row Sensitive      SENSITIVE0.25Performed at [facility], [place] [address], [place], [place] [address]    OXACILLIN Value in next row Resistant      SENSITIVE0.25Performed at [facility], [place] [address] [place], [place] [address]    TETRACYCLINE Value in next row Resistant      SENSITIVE0.25Performed at [facility], [place] [address] [place], [place] [address]    VANCOMYCIN Value in next row Sensitive      SENSITIVE0.25Performed at [facility], [place] [address], [place], [place] [address]    TRIMETH/SULFA Value in next row Sensitive      SENSITIVE0.25Performed at [facility], [place] [address], [place], [place] [address]    CLINDAMYCIN  Value in next row Sensitive      SENSITIVE0.25Performed at [facility], [place] [address], [place], [place] [address]    RIFAMPIN Value in next row Sensitive      SENSITIVE0.25Performed at [facility], [place] [address], [place], [place] [address]    Inducible Clindamycin Value in next row Sensitive  SENSITIVE0.25Performed at [facility], [place] [address], [place],  [address]    * RARE STAPHYLOCOCCUS EPIDERMIDIS     No results found.  ASSESSMENT POD 4 S/P I&D, partial 5th ray amputation and debridement of ulceration of L foot.   PLAN Personally changed Wound VAC dressing.  Continue Wound VAC at 125 mmHg continuous pressure.  Will change VAC dressing again 08/18/2022. Continue IV antibiotics per ID. Pathology pending. May put weight on L heel to transfer to bedside toilet only.  Otherwise, remain NWB L foot.  [name] 08/16/2022, 12:03 PM

## 2022-08-16 NOTE — Progress Notes (Signed)
Patient uses 2L [place] qhs, patient does not use CPAP.

## 2022-08-16 NOTE — Progress Notes (Signed)
Patient has not voided so far this shift put on [facility] just had gas no BM, MD aware of the not voiding. Now sitting in bed side chair. Will bladder scan at noon if no urine.

## 2022-08-16 NOTE — Progress Notes (Signed)
PHARMACY CONSULT NOTE FOR:  OUTPATIENT  PARENTERAL ANTIBIOTIC THERAPY (OPAT)  Indication: MRSE + Pseudomonas L-foot osteo Regimen: Daptomycin 500 mg IV every 24 hours and Cefepime 2g IV every 8 hours End date: 09/09/22  After completion of IV therapy, the patient will continue with oral therapy for 2 weeks - linezolid 600 mg po twice daily and Levofloxacin 750 mg po every 24 hours 12/9-12/22   IV antibiotic discharge orders are pended. To discharging provider:  please sign these orders via discharge navigator,  Select New Orders & click on the button choice - Manage This Unsigned Work.    Thank you for allowing pharmacy to be a part of this patient's care.  Alycia Rossetti, PharmD, BCPS Infectious Diseases Clinical Pharmacist 08/16/2022 1:35 PM   **Pharmacist phone directory can now be found on [email or web address] ([email or web address]).  Listed under [place].

## 2022-08-17 ENCOUNTER — Inpatient Hospital Stay: Payer: Self-pay

## 2022-08-17 DIAGNOSIS — M86172 Other acute osteomyelitis, left ankle and foot: Secondary | ICD-10-CM | POA: Diagnosis not present

## 2022-08-17 LAB — CBC
HCT: 37.4 % — ABNORMAL LOW (ref 39.0–52.0)
Hemoglobin: 12 g/dL — ABNORMAL LOW (ref 13.0–17.0)
MCH: 32.2 pg (ref 26.0–34.0)
MCHC: 32.1 g/dL (ref 30.0–36.0)
MCV: 100.3 fL — ABNORMAL HIGH (ref 80.0–100.0)
Platelets: 166 10*3/uL (ref 150–400)
RBC: 3.73 MIL/uL — ABNORMAL LOW (ref 4.22–5.81)
RDW: 12.9 % (ref 11.5–15.5)
WBC: 5.4 10*3/uL (ref 4.0–10.5)
nRBC: 0 % (ref 0.0–0.2)

## 2022-08-17 LAB — AEROBIC/ANAEROBIC CULTURE W GRAM STAIN (SURGICAL/DEEP WOUND): Gram Stain: NONE SEEN

## 2022-08-17 LAB — CREATININE, SERUM
Creatinine, Ser: 0.59 mg/dL — ABNORMAL LOW (ref 0.61–1.24)
GFR, Estimated: >60 mL/min (ref >60)

## 2022-08-17 LAB — SURGICAL PATHOLOGY

## 2022-08-17 MED ORDER — SODIUM CHLORIDE 0.9% FLUSH
10.0000 mL | Freq: Two times a day (BID) | INTRAVENOUS | Status: DC
  Administered 2022-08-17 – 2022-08-18 (×3): 10 mL

## 2022-08-17 MED ORDER — SODIUM CHLORIDE 0.9% FLUSH: 10.0000 mL | INTRAVENOUS | Status: DC | PRN

## 2022-08-17 MED ORDER — TAMSULOSIN HCL 0.4 MG PO CAPS
0.4000 mg | ORAL_CAPSULE | Freq: Two times a day (BID) | ORAL | Status: DC
  Administered 2022-08-17 – 2022-08-18 (×3): 0.4 mg via ORAL
  Filled 2022-08-17 (×3): qty 1

## 2022-08-17 NOTE — Care Management Important Message (Signed)
Important Message  Patient Details  Name: Samuel Olson MRN: [id number] Date of Birth: Mar 12, 1941   Medicare Important Message Given:  Yes     Tommy Medal 08/17/2022, 2:25 PM

## 2022-08-17 NOTE — Progress Notes (Signed)
PROGRESS NOTE    CALIBER LANDESS  TGG:[id number] DOB: 1941/03/18 DOA: 08/10/2022 PCP: Elyn Aquas    Subjective: No fever  Or chills   No Nausea, Vomiting or Diarrhea  Brief Narrative:  Samuel Olson is a  81 year old male admitted to the hospital with osteomyelitis of left fifth toe and underlying left foot abscess.  Started on IV antibiotics.  Seen by podiatry with plans for operative management 11/10.  Patient is now S/p amputation of fifth metatarsal.   Currently has wound VAC in place.  Continue IV antibiotics.  Pending pathology and cultures to determine final antibiotics per infectious disease team.   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 on 08/12/22 -Intraoperative wound cultures with MRSE + Pseudomonas in the setting of Lt-foot residual osteomyelitis -Infectious disease consult appreciated with the following recommendations -Treat with IV cefepime 2 g every 8 hours AND IV daptomycin 500 mg daily for 6 weeks Last Day of Therapy:  09/09/22 Labs - Once weekly:  CBC/D and BMP, Labs - Every other week:  ESR and CRP -After completing IV cefepime and IV daptomycin as above on 09/10/2022, start Linezolid 600 mg twice daily and Levaquin 750 mg daily for 14 days--last dose 09/24/2022 -Wound VAC changed on 08/18/2022 -Outpatient follow-up with podiatrist Dr. Caprice Beaver advised -Continue Wound VAC as recommended by podiatrist- I   2)Acute urinary retention -Patient initially had Foley catheter was placed for retention --was discontinued 08/15/2022 -He was treated with Flomax, however urinary retention persisted , On 08/17/2022 patient continued to have urinary retention and  required in and out catheterization with more than 600 mL of urine -Patient may need chronic indwelling Foley and outpatient urology follow-up for urodynamic studies   3)Constipation -Improved with laxatives   4)Hyperlipidemia -Continue simvastatin   5)Mood disorder -Continue Depakote -As needed Xanax   6) chronic hypoxic respiratory failure/OSA -At baseline patient uses 2 L of oxygen via nasal cannula, oxygen requirement has Not changed  -Nightly BiPAP advised  7)Generalized Weakness and Ambulatory Dysfunction issues--- physical therapy eval appreciated recommends SNF rehab   DVT prophylaxis: enoxaparin (LOVENOX) injection 40 mg Start: 08/13/22 1000  Code Status: DNR Family Communication: No family at the bedside Disposition Plan: SNF rehab for IV antibiotic treatment and wound care, as well as physical therapy rehab  Status is: Inpatient Remains inpatient appropriate because: Needs operative management of osteomyelitis and subsequent placement to skilled nursing facility  -Disposition: From home-planning to discharge to SNF within 1-2 days   Consultants:  Podiatry  Procedures:  Incision and drainage of abscess, left foot 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, left foot Excisional debridement of ulceration, left foot Application of KCI Wound VAC, left foot  Antimicrobials:  Vancomycin - DCed ceftriaxone DC'd 11/12 Cefepime Daptomycin  Objective: Vitals:   08/16/22 0523 08/16/22 1313 08/17/22 0344 08/17/22 1407  BP: 113/73 110/74 119/80 113/65  Pulse: 69 70 70 68  Resp: 17     Temp: (!) 97.5 F (36.4 C) 97.6 F (36.4 C) 98.4 F (36.9 C) (!) 97.4 F (36.3 C)  TempSrc: Oral Oral Oral Oral  SpO2: 95% 96% 96% 97%  Weight:      Height:  Intake/Output Summary (Last 24 hours) at 08/17/2022 1918 Last data filed at 08/17/2022 0819 Gross per 24 hour  Intake 240 ml  Output 1900 ml  Net -1660 ml   Filed Weights   08/10/22 1446 08/14/22 1300  Weight: 68 kg 63 kg    Exam Gen:- Awake Alert, no acute distress  HEENT:- [place].AT, No sclera icterus Neck-Supple Neck,No JVD,.  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 in situ    Data Reviewed: I have personally reviewed following labs and imaging studies  CBC: Recent Labs  Lab 08/11/22 0432 08/13/22 0530 08/15/22 0510 08/16/22 0413 08/17/22 0441  WBC 7.1 6.6 5.3 5.5 5.4  HGB 11.5* 11.9* 11.1* 11.7* 12.0*  HCT 36.6* 36.8* 34.6* 36.8* 37.4*  MCV 101.1* 99.7 100.3* 100.8* 100.3*  PLT 182 162 154 170 409   Basic Metabolic Panel: Recent Labs  Lab 08/11/22 0432 08/12/22 0420 08/13/22 0530 08/15/22 0510 08/17/22 0441  NA 140  --  139 139  --   K 4.2  --  4.3 4.3  --   CL 105  --  102 104  --   CO2 31  --  31 30  --   GLUCOSE 94  --  96 97  --   BUN 31*  --  19 28*  --   CREATININE 0.66 0.57* 0.62 0.64 0.59*  CALCIUM 8.5*  --  8.5* 8.3*  --    GFR: Estimated Creatinine Clearance: 64.5 mL/min (A) (by C-G formula based on SCr of 0.59 mg/dL (L)).  CBG: Recent Labs  Lab 08/12/22 1444  GLUCAP 82     Recent Results (from the past 240 hour(s))  Culture, blood (Routine X 2) w Reflex to ID Panel     Status: None   Collection Time: 08/10/22  9:59 PM   Specimen: Right Antecubital; Blood  Result Value Ref Range Status   Specimen Description RIGHT ANTECUBITAL  Final   Special Requests   Final    BOTTLES DRAWN AEROBIC AND ANAEROBIC Blood Culture adequate volume   Culture   Final    NO GROWTH 5 DAYS Performed at [facility], [address] [place], [place] [address]    Report Status 08/15/2022 FINAL  Final  Culture, blood (Routine X  2) w Reflex to ID Panel     Status: None   Collection Time: 08/10/22 10:00 PM   Specimen: BLOOD RIGHT HAND  Result Value Ref Range Status   Specimen Description BLOOD RIGHT HAND  Final   Special Requests   Final    BOTTLES DRAWN AEROBIC AND ANAEROBIC Blood Culture adequate volume   Culture   Final    NO GROWTH 5 DAYS Performed at [facility], [address], [place], [place] [address]    Report Status 08/15/2022 FINAL  Final  Surgical PCR screen     Status: None   Collection Time: 08/12/22  6:16 AM   Specimen: Nasal Mucosa; Nasal Swab  Result Value Ref Range Status   MRSA, PCR NEGATIVE NEGATIVE Final   Staphylococcus aureus NEGATIVE NEGATIVE Final    Comment: (NOTE) The Xpert SA Assay (FDA approved for NASAL specimens in patients 81 years of age and older),  is one component of a comprehensive surveillance program. It is not intended to diagnose infection nor to guide or monitor treatment. Performed at [facility], [address], [place], [place] [address]   Aerobic/Anaerobic Culture w Gram Stain (surgical/deep wound)     Status: None   Collection Time: 08/12/22  1:42 PM   Specimen: Soft Tissue, Other  Result Value Ref Range Status   Specimen Description   Final    TISSUE SOFT Performed at [facility], [address] [place], [place] [address]    Special Requests   Final    FOOT LEFT INCISION AND DRAINAGE Performed at [facility], [address], [place], [place] [address]    Gram Stain NO WBC SEEN RARE GRAM POSITIVE COCCI IN PAIRS   Final   Culture   Final    RARE STAPHYLOCOCCUS EPIDERMIDIS RARE PSEUDOMONAS AERUGINOSA NO ANAEROBES ISOLATED Performed at [facility], [place] [address], [place], [place] [address]    Report Status 08/17/2022 FINAL  Final   Organism ID, Bacteria STAPHYLOCOCCUS EPIDERMIDIS  Final   Organism ID, Bacteria PSEUDOMONAS AERUGINOSA  Final      Susceptibility   Pseudomonas aeruginosa - MIC*    CEFTAZIDIME 4 SENSITIVE Sensitive      CIPROFLOXACIN <=0.25 SENSITIVE Sensitive     GENTAMICIN <=1 SENSITIVE Sensitive     IMIPENEM 1 SENSITIVE Sensitive     PIP/TAZO 8 SENSITIVE Sensitive     CEFEPIME 2 SENSITIVE Sensitive     LEVOFLOXACIN Value in next row Sensitive      SENSITIVE0.25    * RARE PSEUDOMONAS AERUGINOSA   Staphylococcus epidermidis - MIC*    CIPROFLOXACIN Value in next row Sensitive      SENSITIVE0.25    ERYTHROMYCIN Value in next row Resistant      SENSITIVE0.25    GENTAMICIN Value in next row Sensitive      SENSITIVE0.25    OXACILLIN Value in next row Resistant      SENSITIVE0.25    TETRACYCLINE Value in next row Resistant      SENSITIVE0.25    VANCOMYCIN Value in next row Sensitive      SENSITIVE0.25    TRIMETH/SULFA Value in next row Sensitive      SENSITIVE0.25    CLINDAMYCIN Value in next row Sensitive      SENSITIVE0.25    RIFAMPIN Value in next row Sensitive      SENSITIVE0.25    Inducible Clindamycin Value in next row Sensitive      SENSITIVE0.25    * RARE STAPHYLOCOCCUS EPIDERMIDIS     Radiology Studies: [place] EKG SITE RITE  Result Date: 08/17/2022 If Site Rite image not attached, placement could not be confirmed due to current cardiac rhythm.   Scheduled Meds:  acidophilus  1 capsule Oral TID   aspirin EC  81 mg Oral Daily   Chlorhexidine Gluconate Cloth  6 each Topical Daily   cholecalciferol  5,000 Units Oral q morning   divalproex  125 mg Oral BID   enoxaparin (LOVENOX) injection  40 mg Subcutaneous Q24H   feeding supplement  237 mL Oral BID BM   melatonin  6 mg Oral QHS   multivitamin with minerals  1 tablet Oral Daily   nutrition supplement (JUVEN)  1 packet Oral BID BM   pantoprazole  40 mg Oral Daily   polycarbophil  1,250 mg Oral Daily   polyethylene glycol  17 g Oral TID   pramipexole  1 mg Oral TID  senna-docusate  1 tablet Oral BID   sodium chloride flush  10-40 mL Intracatheter Q12H   tamsulosin  0.4 mg Oral BID   Continuous Infusions:  ceFEPime (MAXIPIME) IV 2  g (08/17/22 1427)   DAPTOmycin (CUBICIN) 500 mg in sodium chloride 0.9 % IVPB 500 mg (08/16/22 2317)    LOS: 7 days   Roxan Hockey, MD [facility] If 7PM-7AM, please contact night-coverage [email or web address]  08/17/2022, 7:18 PM

## 2022-08-17 NOTE — TOC Progression Note (Signed)
Transition of Care [facility]) - Progression Note    Patient Details  Name: Samuel Olson MRN: [id number] Date of Birth: Apr 07, 1941  Transition of [facility]) CM/SW Contact  Salome Arnt, [place] Phone Number: 08/17/2022, 11:07 AM  Clinical Narrative:  Discussed expanding bed search with pt's wife as still no bed offers. Wife agreeable. Pt's wife accepts bed at [facility]. Facility notified. CMA updating authorization.      Expected Discharge Plan: [place] Barriers to Discharge: Continued Medical Work up  Expected Discharge Plan and Services Expected Discharge Plan: [place] In-house Referral: Clinical Social Work   Post Acute Care Choice: [place] Living arrangements for the past 2 months: Single Family Home                                       Social Determinants of Health (SDOH) Interventions    Readmission Risk Interventions     No data to display

## 2022-08-17 NOTE — Progress Notes (Signed)
Physical Therapy Treatment Patient Details Name: Samuel Olson MRN: [id number] DOB: Oct 31, 1940 Today's Date: 08/17/2022   History of Present Illness Samuel Olson  is a 81 y.o. male, with past medical history of GERD, mood disorder, restless leg syndrome, remote history of seizures (last in 1973), patient was sent by his podiatrist Dr. 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  -In ED no significant lab abnormalities, blood cultures were sent, and patient was started on broad-spectrum IV antibiotics and [facility] consulted to admit.    PT Comments    Patient presents in bed and very confused. Patient able to follow commands initially to sit EOB without assistance and demonstrates good seated balance, but is unable to follow any further commands due to perseverating on who is outside his room and agitation. Patient is oriented that he is in the hospital, but is consistently questioning where his wife/other hospital staff are. Patient requiring constant redirection to therapy session with fair/poor carryover. Patient put back to bed - nurse aware. Patient will benefit from continued skilled physical therapy in hospital and recommended venue below to increase strength, balance, endurance for safe ADLs and gait.    Recommendations for follow up therapy are one component of a multi-disciplinary discharge planning process, led by the attending physician.   Recommendations may be updated based on patient status, additional functional criteria and insurance authorization.  Follow Up Recommendations  Skilled nursing-short term rehab (<3 hours/day) Can patient physically be transported by private vehicle: Yes   Assistance Recommended at Discharge Intermittent Supervision/Assistance  Patient can return home with the following Help with stairs or ramp for entrance;Assistance with cooking/housework;A lot of help with walking and/or transfers;A lot of help with bathing/dressing/bathroom   Equipment Recommendations  None recommended by PT    Recommendations for Other Services       Precautions / Restrictions Precautions Precautions: Fall Restrictions Weight Bearing Restrictions: Yes LLE Weight Bearing: Non weight bearing LLE Partial Weight Bearing Percentage or Pounds: NWB other than PWB on heel for transfers     Mobility  Bed Mobility Overal bed mobility: Modified Independent Bed Mobility: Supine to Sit     Supine to sit: Supervision     General bed mobility comments: slightly labored movement    Transfers                        Ambulation/Gait                   Stairs             Wheelchair Mobility    Modified Rankin (Stroke Patients Only)       Balance Overall balance assessment: Needs assistance Sitting-balance support: No upper extremity supported, Feet supported Sitting balance-Leahy Scale: Good Sitting balance - Comments: seated EOB  Cognition Arousal/Alertness: Awake/alert Behavior During Therapy: Anxious Overall Cognitive Status: Impaired/Different from baseline                                 General Comments: Pt very confused (and agrees with this), able to follow a few commands in the beginning of the session before not following commands at all due to perseverating on who is outside of his room         Exercises      General Comments        Pertinent Vitals/Pain Pain Assessment Pain Assessment: No/denies pain    Home Living                          Prior Function            PT Goals (current goals can now be found in the care plan section) Acute Rehab PT Goals Patient Stated Goal: return home after rehab PT Goal Formulation: With patient Time For Goal Achievement: 08/31/22 Potential to Achieve Goals: Good Progress towards PT goals: Progressing toward goals    Frequency    Min 3X/week      PT Plan Current plan remains appropriate    Co-evaluation              AM-PAC PT "6 Clicks" Mobility   Outcome Measure  Help needed turning from your back to your side while in a flat bed without using bedrails?: None Help needed moving from lying on your back to sitting on the side of a flat bed without using bedrails?: None Help needed moving to and from a bed to a chair (including a wheelchair)?: A Little Help needed standing up from a chair using your arms (e.g., wheelchair or bedside chair)?: A Little Help needed to walk in hospital room?: A Little Help needed climbing 3-5 steps with a railing? : A Lot 6 Click Score: 19    End of Session   Activity Tolerance: Patient limited by lethargy;Treatment limited secondary to agitation Patient left: in bed;with call bell/phone within reach Nurse Communication: Mobility status PT Visit Diagnosis: Unsteadiness on feet (R26.81);Other abnormalities of gait and mobility (R26.89);Muscle weakness (generalized) (M62.81)     Time: 3419-6222 PT Time Calculation (min) (ACUTE ONLY): 15 min  Charges:  $Therapeutic Activity: 8-22 mins                     Zigmund Gottron, SPT

## 2022-08-17 NOTE — Progress Notes (Signed)
PODIATRY PROGRESS NOTE  SUBJECTIVE Samuel Olson POD 5 following I&D, partial 5th ray amputation and debridement of ulceration of L foot. His wife contacted my [place] office approximately an hour ago stating she wanted to take him home.    MEDICATIONS Scheduled Meds:  acidophilus  1 capsule Oral TID   aspirin EC  81 mg Oral Daily   Chlorhexidine Gluconate Cloth  6 each Topical Daily   cholecalciferol  5,000 Units Oral q morning   divalproex  125 mg Oral BID   enoxaparin (LOVENOX) injection  40 mg Subcutaneous Q24H   feeding supplement  237 mL Oral BID BM   melatonin  6 mg Oral QHS   multivitamin with minerals  1 tablet Oral Daily   nutrition supplement (JUVEN)  1 packet Oral BID BM   pantoprazole  40 mg Oral Daily   polycarbophil  1,250 mg Oral Daily   polyethylene glycol  17 g Oral TID   pramipexole  1 mg Oral TID   senna-docusate  1 tablet Oral BID   tamsulosin  0.4 mg Oral BID   Continuous Infusions:  ceFEPime (MAXIPIME) IV 2 g (08/17/22 0545)   DAPTOmycin (CUBICIN) 500 mg in sodium chloride 0.9 % IVPB 500 mg (08/16/22 2317)   PRN Meds:.ALPRAZolam, fluticasone, HYDROcodone-acetaminophen, morphine injection, mouth rinse, polyethylene glycol  OBJECTIVE Vital signs in last 24 hours:   Temp:  [98.4 F (36.9 C)] 98.4 F (36.9 C) (11/15 0344) Pulse Rate:  [70] 70 (11/15 0344) BP: (119)/(80) 119/80 (11/15 0344) SpO2:  [96 %] 96 % (11/15 0344)  LAB/TEST RESULTS  Recent Labs    08/15/22 0510 08/16/22 0413 08/17/22 0441  WBC 5.3 5.5 5.4  HGB 11.1* 11.7* 12.0*  HCT 34.6* 36.8* 37.4*  PLT 154 170 166  NA 139  --   --   K 4.3  --   --   CL 104  --   --   CO2 30  --   --   BUN 28*  --   --   CREATININE 0.64  --  0.59*  GLUCOSE 97  --   --   CALCIUM 8.3*  --   --      Recent Results (from the past 240 hour(s))  Culture, blood (Routine X 2) w Reflex to ID Panel     Status: None   Collection Time: 08/10/22  9:59 PM   Specimen: Right Antecubital; Blood  Result Value  Ref Range Status   Specimen Description RIGHT ANTECUBITAL  Final   Special Requests   Final    BOTTLES DRAWN AEROBIC AND ANAEROBIC Blood Culture adequate volume   Culture   Final    NO GROWTH 5 DAYS Performed at [facility], [address], [place], [place] [address]    Report Status 08/15/2022 FINAL  Final  Culture, blood (Routine X 2) w Reflex to ID Panel     Status: None   Collection Time: 08/10/22 10:00 PM   Specimen: BLOOD RIGHT HAND  Result Value Ref Range Status   Specimen Description BLOOD RIGHT HAND  Final   Special Requests   Final    BOTTLES DRAWN AEROBIC AND ANAEROBIC Blood Culture adequate volume   Culture   Final    NO GROWTH 5 DAYS Performed at [facility], [address] [place], [place] [address]    Report Status 08/15/2022 FINAL  Final  Surgical PCR screen     Status: None   Collection Time: 08/12/22  6:16 AM  Specimen: Nasal Mucosa; Nasal Swab  Result Value Ref Range Status   MRSA, PCR NEGATIVE NEGATIVE Final   Staphylococcus aureus NEGATIVE NEGATIVE Final    Comment: (NOTE) The Xpert SA Assay (FDA approved for NASAL specimens in patients 34 years of age and older), is one component of a comprehensive surveillance program. It is not intended to diagnose infection nor to guide or monitor treatment. Performed at [facility], [address] [place], [place] [address]   Aerobic/Anaerobic Culture w Gram Stain (surgical/deep wound)     Status: None   Collection Time: 08/12/22  1:42 PM   Specimen: Soft Tissue, Other  Result Value Ref Range Status   Specimen Description   Final    TISSUE SOFT Performed at [facility], [address], [place], [place] [address]    Special Requests   Final    FOOT LEFT INCISION AND DRAINAGE Performed at [facility], [address] [place], [place] [address]    Gram Stain NO WBC SEEN RARE GRAM POSITIVE COCCI IN PAIRS   Final   Culture   Final    RARE STAPHYLOCOCCUS EPIDERMIDIS RARE PSEUDOMONAS AERUGINOSA NO ANAEROBES  ISOLATED Performed at [facility], [place] [address], [place], [place] [address]    Report Status 08/17/2022 FINAL  Final   Organism ID, Bacteria STAPHYLOCOCCUS EPIDERMIDIS  Final   Organism ID, Bacteria PSEUDOMONAS AERUGINOSA  Final      Susceptibility   Pseudomonas aeruginosa - MIC*    CEFTAZIDIME 4 SENSITIVE Sensitive     CIPROFLOXACIN <=0.25 SENSITIVE Sensitive     GENTAMICIN <=1 SENSITIVE Sensitive     IMIPENEM 1 SENSITIVE Sensitive     PIP/TAZO 8 SENSITIVE Sensitive     CEFEPIME 2 SENSITIVE Sensitive     LEVOFLOXACIN Value in next row Sensitive      SENSITIVE0.25    * RARE PSEUDOMONAS AERUGINOSA   Staphylococcus epidermidis - MIC*    CIPROFLOXACIN Value in next row Sensitive      SENSITIVE0.25    ERYTHROMYCIN Value in next row Resistant      SENSITIVE0.25    GENTAMICIN Value in next row Sensitive      SENSITIVE0.25    OXACILLIN Value in next row Resistant      SENSITIVE0.25    TETRACYCLINE Value in next row Resistant      SENSITIVE0.25    VANCOMYCIN Value in next row Sensitive      SENSITIVE0.25    TRIMETH/SULFA Value in next row Sensitive      SENSITIVE0.25    CLINDAMYCIN Value in next row Sensitive      SENSITIVE0.25    RIFAMPIN Value in next row Sensitive      SENSITIVE0.25    Inducible Clindamycin Value in next row Sensitive      SENSITIVE0.25    * RARE STAPHYLOCOCCUS EPIDERMIDIS     [place] EKG SITE RITE  Result Date: 08/17/2022 If Site Rite image not attached, placement could not be confirmed due to current cardiac rhythm.   ASSESSMENT POD 5 S/P I&D, partial 5th ray amputation and debridement of ulceration of L foot.   PLAN I explained to his wife that he needs to go to SNF.  It is important for him to stay off of his foot.  In addition, he will need IV ABX and his Wound VAC dressing changed.  RECOMMENDATIONS Okay for D/C to SNF from my standpoint. Antibiotics per ID recommendations. Will need Wound VAC changed 3 times per week.  VAC should remain  at  125 mmHg of continuous pressure. May put weight on L heel to transfer to bedside toilet only.  Otherwise, remain NWB L foot. He should follow-up with me at my [place] office on 09/05/2022 at 2:00 PM.  [name] 08/17/2022, 1:20 PM

## 2022-08-17 NOTE — Progress Notes (Signed)
Peripherally Inserted Central Catheter Placement  The IV Nurse has discussed with the patient and/or persons authorized to consent for the patient, the purpose of this procedure and the potential benefits and risks involved with this procedure.  The benefits include less needle sticks, lab draws from the catheter, and the patient may be discharged home with the catheter. Risks include, but not limited to, infection, bleeding, blood clot (thrombus formation), and puncture of an artery; nerve damage and irregular heartbeat and possibility to perform a PICC exchange if needed/ordered by physician.  Alternatives to this procedure were also discussed.  Bard Power PICC patient education guide, fact sheet on infection prevention and patient information card has been provided to patient /or left at bedside.    PICC Placement Documentation  PICC Single Lumen 83/81/84 Right Basilic 41 cm 0 cm (Active)  Indication for Insertion or Continuance of Line Prolonged intravenous therapies 08/17/22 1347  Exposed Catheter (cm) 0 cm 08/17/22 1347  Site Assessment Clean, Dry, Intact 08/17/22 1347  Line Status Flushed;Blood return noted;Saline locked 08/17/22 1347  Dressing Type Transparent 08/17/22 1347  Dressing Status Antimicrobial disc in place 08/17/22 [place] checked and tightened 08/17/22 1347  Dressing Change Due 08/24/22 08/17/22 1347       Scotty Court 08/17/2022, 1:49 PM

## 2022-08-17 NOTE — Progress Notes (Signed)
Occupational Therapy Treatment Patient Details Name: Samuel Olson MRN: [id number] DOB: [birth year] Today's Date: 08/17/2022   History of present illness Samuel Olson  is a 81 y.o. male, with past medical history of GERD, mood disorder, restless leg syndrome, remote history of seizures (last in 1973), patient was sent by his podiatrist Dr. 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  -In ED no significant lab abnormalities, blood cultures were sent, and patient was started on broad-spectrum IV antibiotics and [facility] hospitalist consulted to admit.   OT comments  Pt agreeable to OT evaluation but more confused than during previous evaluation. Pt followed commands well but repeated himself often and perseverated on who this therapist worked for. Pt was able to complete bed mobility with supervision. Min to mod A needed for sit to stand from EOB with RW. Pt was nervious and very concerned about maintaining his L LE weight bearing precautions, which he did well. Pt finished the session by completing B UE strengthening while inclined in bed. Pt left with be alarm set and call bell within reach. Pt will benefit from continued OT in the hospital and recommended venue below to increase strength, balance, and endurance for safe ADL's.      Recommendations for follow up therapy are one component of a multi-disciplinary discharge planning process, led by the attending  physician.  Recommendations may be updated based on patient status, additional functional criteria and insurance authorization.    Follow Up Recommendations  Skilled nursing-short term rehab (<3 hours/day)     Assistance Recommended at Discharge Intermittent Supervision/Assistance  Patient can return home with the following  A little help with walking and/or transfers;A lot of help with bathing/dressing/bathroom;Assistance with cooking/housework;Assist for transportation;Help with stairs or ramp for entrance   Equipment Recommendations  None recommended by OT    Recommendations for Other Services      Precautions / Restrictions Precautions Precautions: Fall Restrictions Weight Bearing Restrictions: Yes LLE Weight Bearing: Non weight bearing LLE Partial Weight Bearing Percentage or Pounds: NWB other than PWB on heel for transfers (LLE)       Mobility Bed Mobility Overal bed mobility: Needs Assistance Bed Mobility: Supine to Sit     Supine to sit: Supervision     General bed mobility comments: use of bedrails, slightly labored movement    Transfers Overall transfer level: Needs assistance Equipment used: Rolling walker (2 wheels) Transfers: Sit to/from Stand Sit to Stand: Min assist, Mod assist           General transfer comment: Pt required assist to boost from EOB to stand x2 repswith good ability to keep WB precautions. Pt was nervious and fearful of puting weight on his L LE. Stand pivot not attempted.     Balance Overall balance assessment: Needs assistance Sitting-balance support: Feet supported, No upper extremity supported Sitting balance-Leahy Scale: Good Sitting balance - Comments: seated EOB   Standing balance support: Bilateral upper extremity supported, During functional activity, Reliant on assistive device for balance Standing balance-Leahy Scale:  Poor Standing balance comment: poor to fair with RW                             Cognition Arousal/Alertness: Awake/alert Behavior During Therapy: WFL for tasks assessed/performed Overall Cognitive Status: Impaired/Different from baseline                                 General Comments: Pt repeatedly asking who this therapist worked for. Pt followed commands well but generally was confused and repeating himself.        Exercises Exercises: General Upper Extremity General Exercises - Upper Extremity Shoulder Flexion: AROM, Both, 10 reps, Supine Shoulder ABduction: AROM, Both, 10 reps, Supine Shoulder Horizontal ABduction: AROM, Both, 10 reps, Supine (x10 protraction and overhead press as well.)            Pertinent Vitals/ Pain       Pain Assessment Pain Assessment: No/denies pain                                                          Frequency  Min 1X/week        Progress Toward Goals  OT Goals(current goals can now be found in the care plan section)  Progress towards OT goals: Progressing toward goals  Acute Rehab OT Goals Patient Stated Goal: Go to rehab. OT Goal Formulation: With patient Time For Goal Achievement: 08/25/22 Potential to Achieve Goals: Good ADL Goals Pt Will Perform Grooming: with modified independence;standing Pt Will Perform Lower Body Bathing: with modified independence;sitting/lateral leans;with adaptive equipment Pt Will Perform Lower Body Dressing: with modified independence;with adaptive equipment;sitting/lateral leans Pt Will Transfer to Toilet: with modified independence;ambulating Pt/caregiver will Perform Home Exercise Program: Increased strength;Both right and left upper extremity;Independently  Plan Discharge plan remains appropriate                                    End of Session Equipment Utilized During Treatment: Rolling walker (2 wheels)  OT Visit Diagnosis: Unsteadiness on feet (R26.81);Other abnormalities of gait and mobility (R26.89);Muscle  weakness (generalized) (M62.81);Repeated falls (R29.6);History of falling (Z91.81)   Activity Tolerance Patient tolerated treatment well   Patient Left in chair;with call bell/phone within reach   Nurse Communication          Time: 1610-9604 OT Time Calculation (min): 23 min  Charges: OT General Charges $OT Visit: 1 Visit OT Treatments $Therapeutic Exercise: 8-22 mins  Lannie Yusuf OT, MOT  Larey Seat 08/17/2022, 10:38 AM

## 2022-08-17 NOTE — Progress Notes (Signed)
Wears 2L at night, does not use CPAP.

## 2022-08-17 NOTE — Progress Notes (Signed)
No acute events overnight. Traveon Louro Elanie Hammitt,RN 

## 2022-08-17 NOTE — TOC Progression Note (Addendum)
Transition of Care [facility]) - Progression Note    Patient Details  Name: Samuel Olson MRN: [id number] Date of Birth: Feb 06, 1941  Transition of Care [facility]) CM/SW Contact  Salome Arnt, [place] Phone Number: 08/17/2022, 3:12 PM  Clinical Narrative: Pt's wife called LCSW and said she now wants [facility]. Facility notified and will order wound vac. Courtney in admissions to let LCSW know if they can get wound vac today. Auth received for [facility]. MD updated.        Expected Discharge Plan: [place] Barriers to Discharge: Continued Medical Work up  Expected Discharge Plan and Services Expected Discharge Plan: [place] In-house Referral: Clinical Social Work   Post Acute Care Choice: [place] Living arrangements for the past 2 months: Single Family Home                                       Social Determinants of Health (SDOH) Interventions    Readmission Risk Interventions     No data to display

## 2022-08-18 DIAGNOSIS — M86172 Other acute osteomyelitis, left ankle and foot: Secondary | ICD-10-CM | POA: Diagnosis not present

## 2022-08-18 LAB — CBC
HCT: 34.9 % — ABNORMAL LOW (ref 39.0–52.0)
Hemoglobin: 11.1 g/dL — ABNORMAL LOW (ref 13.0–17.0)
MCH: 32 pg (ref 26.0–34.0)
MCHC: 31.8 g/dL (ref 30.0–36.0)
MCV: 100.6 fL — ABNORMAL HIGH (ref 80.0–100.0)
Platelets: 171 10*3/uL (ref 150–400)
RBC: 3.47 MIL/uL — ABNORMAL LOW (ref 4.22–5.81)
RDW: 13 % (ref 11.5–15.5)
WBC: 4.8 10*3/uL (ref 4.0–10.5)
nRBC: 0 % (ref 0.0–0.2)

## 2022-08-18 MED ORDER — DAPTOMYCIN IV (FOR PTA / DISCHARGE USE ONLY): 500.0000 mg | INTRAVENOUS | 0 refills | Status: AC

## 2022-08-18 MED ORDER — ALPRAZOLAM 0.5 MG PO TABS: 0.5000 mg | ORAL_TABLET | Freq: Two times a day (BID) | ORAL | 0 refills | Status: AC | PRN

## 2022-08-18 MED ORDER — TAMSULOSIN HCL 0.4 MG PO CAPS: 0.4000 mg | ORAL_CAPSULE | Freq: Every day | ORAL | 5 refills | Status: DC

## 2022-08-18 MED ORDER — SENNOSIDES-DOCUSATE SODIUM 8.6-50 MG PO TABS: 2.0000 | ORAL_TABLET | Freq: Two times a day (BID) | ORAL | 5 refills | Status: AC

## 2022-08-18 MED ORDER — CEFEPIME IV (FOR PTA / DISCHARGE USE ONLY): 2.0000 g | Freq: Three times a day (TID) | INTRAVENOUS | 0 refills | Status: AC

## 2022-08-18 MED ORDER — LEVOFLOXACIN 750 MG PO TABS: 750.0000 mg | ORAL_TABLET | Freq: Every day | ORAL | 0 refills | Status: AC

## 2022-08-18 MED ORDER — POLYETHYLENE GLYCOL 3350 17 G PO PACK: 17.0000 g | PACK | Freq: Two times a day (BID) | ORAL | 5 refills | Status: AC

## 2022-08-18 MED ORDER — LINEZOLID 600 MG PO TABS: 600.0000 mg | ORAL_TABLET | Freq: Two times a day (BID) | ORAL | 0 refills | Status: AC

## 2022-08-18 NOTE — Discharge Instructions (Addendum)
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 Caprice Beaver-- in 7 to 10 days --- at [facility], -[address], [place] [address]  4)Please follow-up with Urologist Dr. Alyson Ingles in about 2 weeks or so --- for possible removal of Foley catheter and voiding trial in his office----[facility], [address], [place] [address] Phone Number----[phone]  5)Lt Foot Wound VAC with continuous suction at 125 mmHg, wound VAC was last changed on 08/18/2022  6)you need oxygen at home at 2 L via nasal cannula continuously while awake and while asleep--- smoking or having open fires around oxygen can cause fire, significant injury and death

## 2022-08-18 NOTE — TOC Transition Note (Addendum)
Transition of Care [facility]) - CM/SW Discharge Note   Patient Details  Name: Samuel Olson MRN: [id number] Date of Birth: 11-Apr-1941  Transition of Care [facility]) CM/SW Contact:  Shade Flood, LCSW Phone Number: 08/18/2022, 1:04 PM   Clinical Narrative:     Pt stable for dc per MD. Damaris Schooner with Merrilyn Puma at [facility] today and they are able to accept pt as long as he can have wet to dry dressing overnight and wound vac applied tomorrow. Discussed with Dr. Caprice Beaver who states that this is acceptable. Updated Merrilyn Puma that pt will need small black foam for the wound vac.  Spoke with pt and wife in the room to review dc plan. They remain in agreement with EMS transfer to [facility].  DC clinical sent electronically. RN to call report. EMS arranged.  There are no other TOC needs for dc.  1637: TOC received information from [place] is out of network with [employer]. Updated pt's wife who states that she understands and would still like for pt to go to [facility]. Also updated pt's wife that EMS may not have staffing to get pt tonight and so he may dc tomorrow AM. RN to keep her updated.  Final next level of care: Skilled Nursing Facility Barriers to Discharge: Barriers Resolved   Patient Goals and CMS Choice Patient states their goals for this hospitalization and ongoing recovery are:: short term rehab CMS Medicare.gov Compare Post Acute Care list provided to:: Patient Represenative (must comment) Choice offered to / list presented to : Adult Children  Discharge Placement              Patient chooses bed at: [facility] Patient to be transferred to facility by: EMS Name of family member notified: Pamala Hurry Patient and family notified of of transfer: 08/18/22  Discharge Plan and Services In-house Referral: Clinical Social Work   Post Acute Care Choice: [place]                               Social Determinants of Health  (SDOH) Interventions     Readmission Risk Interventions     No data to display

## 2022-08-18 NOTE — Discharge Summary (Addendum)
Samuel Olson, is a 81 y.o. male  DOB [birth year]  MRN [id number].  Admission date:  08/10/2022  Admitting Physician  Albertine Patricia, MD  Discharge Date:  08/18/2022   Primary MD  Elyn Aquas  Recommendations for primary care physician for things to follow:   1)Indication:  MRSE + PsA L-foot residual osteomyelitis -Treat with IV cefepime 2 g every 8 hours AND IV daptomycin 500 mg daily for 6 weeks Last Day of Therapy:  09/09/22 Labs - Once weekly:  CBC/D and BMP, Labs - Every other week:  ESR and CRP -After completing IV cefepime and IV daptomycin as above on 09/10/2022.linezolid 600 mg twice daily and Levaquin 750 mg daily for 14 days--last dose 09/24/2022  2)Continue Wound VAC as recommended by podiatrist- if Wound Vac is Not available yet, then ok to temporarily Remove current wound vac dressing. Do wet to dry until wound vac can be applied later. Will need Wound VAC changed 3 times per week. (M, W, Fri) VAC should remain at 125 mmHg of continuous pressure.   3)Follow up with podiatrist Dr. Caprice Beaver-- in 7 to 10 days --- at [facility], -[address] [place], [place] [address]  4)Please follow-up with Urologist Dr. 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  Admission Diagnosis  Acute  osteomyelitis of left foot ([place]) [M86.172]   Discharge Diagnosis  Acute osteomyelitis of left foot ([place]) [W29.562]    Principal Problem:   Acute osteomyelitis of left foot ([place]) Active Problems:   Chronic constipation   Sleep apnea   Acute urinary retention   Abscess of left foot      Past Medical History:  Diagnosis Date   Anxiety    Arthritis    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   Sleep apnea    Uses O2 at 2L while sleeping; "can't tolerate CPAP OR BIPAP" * (04/17/2017)  Vertigo    Wears glasses     Past Surgical History:  Procedure Laterality Date   BACK SURGERY     BILIARY STENT PLACEMENT  12/10/2018   Procedure: BILIARY STENT PLACEMENT;  Surgeon: Irving Copas., MD;  Location: [facility];  Service: Gastroenterology;;   BIOPSY  12/10/2018   Procedure: BIOPSY;  Surgeon: Irving Copas., MD;  Location: [facility];  Service: Gastroenterology;;   CARDIAC CATHETERIZATION     CHOLECYSTECTOMY     COLONOSCOPY     ENDOSCOPIC RETROGRADE CHOLANGIOPANCREATOGRAPHY (ERCP) WITH PROPOFOL N/A 12/10/2018   Procedure: ENDOSCOPIC RETROGRADE CHOLANGIOPANCREATOGRAPHY (ERCP) WITH PROPOFOL;  Surgeon: Irving Copas., MD;  Location: [facility];  Service: Gastroenterology;  Laterality: N/A;  NEED  2 1/2 HOUR FOR CASE-JILL/PATTY   ESOPHAGOGASTRODUODENOSCOPY (EGD) WITH PROPOFOL N/A 12/10/2018   Procedure: ESOPHAGOGASTRODUODENOSCOPY (EGD) WITH PROPOFOL;  Surgeon: Rush Landmark Telford Nab., MD;  Location: [facility];  Service:  Gastroenterology;  Laterality: N/A;   FRACTURE SURGERY     Left plates and screws   OSTECTOMY METATARSAL Left    5th   pain stimulator   2012   pain stimulator   2012   removed 2012   PARTIAL KNEE ARTHROPLASTY Right 04/17/2017   Procedure: UNICOMPARTMENTAL KNEE;  Surgeon: Vickey Huger, MD;  Location: [facility];  Service: Orthopedics;  Laterality: Right;   REMOVAL OF STONES  12/10/2018   Procedure: REMOVAL OF STONES;  Surgeon: Rush Landmark Telford Nab., MD;  Location: [facility];  Service: Gastroenterology;;   REPLACEMENT UNICONDYLAR JOINT KNEE Right 04/17/2017   SPHINCTEROTOMY  12/10/2018   Procedure: Joan Mayans;  Surgeon: Mansouraty, Telford Nab., MD;  Location: [facility];  Service: Gastroenterology;;   Bess Kinds CHOLANGIOSCOPY N/A 12/10/2018   Procedure: ZOXWRUEA CHOLANGIOSCOPY;  Surgeon: Irving Copas., MD;  Location: [facility];  Service: Gastroenterology;  Laterality: N/A;   TONSILLECTOMY     UVULOPALATOPHARYNGOPLASTY (UPPP)/TONSILLECTOMY/SEPTOPLASTY     VASECTOMY       HPI  from the history and physical done on the day of admission:   Samuel Olson  is a 81 y.o. male, with past medical history of GERD, mood disorder, restless leg syndrome, remote history of seizures (last in 1973), patient was sent by his podiatrist Dr. Caprice Beaver for evaluation of to myelitis, patient with nonhealing ulcer of his left foot, status post surgery at [facility] in August 2023, postoperative course was uneventful, surgical site has healed, he subsequently developed new ulcer behind the region of site, wound cultures were significant for MRSA, for which he was treated with doxycycline, RI was obtained which did show evidence of left foot osteomyelitis of left metatarsal area, with worsening wound, so patient was sent for further evaluation, patient/family denies any fever, chills, but overall reports generalized weakness and frailty. -In ED no significant lab abnormalities, blood cultures were  sent, and patient was started on broad-spectrum IV antibiotics and [facility] hospitalist consulted to admit.   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 on 08/12/22 -Intraoperative wound cultures with MRSE + Pseudomonas in the setting of Lt-foot residual osteomyelitis -Infectious disease consult appreciated with the following recommendations -Treat with IV cefepime 2 g every 8 hours AND IV daptomycin 500 mg daily for 6 weeks Last Day of Therapy:  09/09/22 Labs -  Once weekly:  CBC/D and BMP, Labs - Every other week:  ESR and CRP -After completing IV cefepime and IV daptomycin as above on 09/10/2022, start Linezolid 600 mg twice daily and Levaquin 750 mg daily for 14 days--last dose 09/24/2022 -Wound VAC changed on 08/18/2022 -Outpatient follow-up with podiatrist Dr. Caprice Beaver advised -Continue Wound VAC as recommended by podiatrist- if Wound Vac is Not available yet, then ok to temporarily Remove current wound vac dressing. Do wet to dry until wound vac can be applied later. Will need Wound VAC changed 3 times per week. (M, W, Fri) VAC should remain at 125 mmHg of continuous pressure.  -No additional precautions needed for patient's MRSE infection in left 5th digit which has been amputated   2)Acute urinary retention -Patient initially had Foley catheter was placed for retention --was discontinued 08/15/2022 -He was treated with Flomax, however urinary retention persisted , On 08/17/2022 patient continued to have urinary retention and required in and out catheterization with more than 600 mL of urine -On 08/18/2022 patient had urinary retention with over 900 mL of urine -Foley catheter was reinserted on  08/18/2022 -Continue Flomax and follow-up with Dr. Alyson Ingles the urologist for possible Foley catheter removal and voiding trial as outpatient in 10 to 14 days   3)Constipation -Improved with laxatives -Discharge on MiraLAX and Senokot-S   4)Hyperlipidemia -Continue simvastatin   5)Mood disorder -Continue Depakote -As needed Xanax   6) chronic hypoxic respiratory failure -At baseline patient uses 2 L of oxygen via nasal cannula, oxygen requirement has Not changed  7)Generalized Weakness and Ambulatory Dysfunction issues--- physical therapy eval appreciated recommends SNF rehab  Discharge Condition: Stable  Follow UP   Contact information for follow-up providers     Caprice Beaver, DPM. Schedule an appointment as soon as possible for a visit in 7 day(s).   Specialty: Podiatry Why: for wound check Contact information: [place] [address] [phone]         Cleon Gustin, MD. Schedule an appointment as soon as possible for a visit in 2 week(s).   Specialty: Urology Contact information: [address]  [place] [address] [phone]              Contact information for after-discharge care     [facility] .   Service: Skilled Nursing Contact information: [place] [phone]                     Consults obtained -infection disease and podiatry  Diet and Activity recommendation:  As advised  Discharge Instructions     Discharge Instructions     [facility] pharmacist to adjust dose for Vancomycin, Aminoglycosides and other anti-infective therapies as requested by physician.   Complete by: As directed    [facility] to provide Cath Flo 58m   Complete by: As directed    Administer for PICC line occlusion and as ordered by physician for other access device issues.   Anaphylaxis Kit: Provided to  treat any anaphylactic reaction to the medication being provided to the patient if First Dose or when requested by physician   Complete by: As directed    Epinephrine 1911mml vial / amp: Administer 0.11m64m0.11ml211mubcutaneously once for moderate to severe anaphylaxis, nurse to call physician and pharmacy when reaction occurs and call 911 if needed for immediate care   Diphenhydramine 50mg66mIV vial: Administer  25-711m IV/IM PRN for first dose reaction, rash, itching, mild reaction, nurse to call physician and pharmacy when reaction occurs   Sodium Chloride 0.9% NS 5069mIV: Administer if needed for hypovolemic blood pressure drop or as ordered by physician after call to physician with anaphylactic reaction   Call MD for:  difficulty breathing, headache or visual disturbances   Complete by: As directed    Call MD for:  persistant dizziness or light-headedness   Complete by: As directed    Call MD for:  persistant nausea and vomiting   Complete by: As directed    Call MD for:  temperature >100.4   Complete by: As directed    Change dressing on IV access line weekly and PRN   Complete by: As directed    Diet - low sodium heart healthy   Complete by: As directed    Discharge instructions   Complete by: As directed    1)Indication:  MRSE + PsA L-foot residual osteomyelitis -Treat with IV cefepime 2 g every 8 hours AND IV daptomycin 500 mg daily for 6 weeks Last Day of Therapy:  09/09/22 Labs - Once weekly:  CBC/D and BMP, Labs - Every other week:  ESR and CRP -After completing IV cefepime and IV daptomycin as above on 09/10/2022.linezolid 600 mg twice daily and Levaquin 750 mg daily for 14 days--last dose 09/24/2022 -No additional precautions needed for patient's MRSE infection in left 5th digit which has been amputated  2)Continue Wound VAC as recommended by podiatrist- if Wound Vac is Not available yet, then ok to temporarily Remove current wound vac dressing. Do wet to dry until wound vac can  be applied later. Will need Wound VAC changed 3 times per week. (M, W, Fri) VAC should remain at 125 mmHg of continuous pressure.   3)Follow up with podiatrist Dr. BeCaprice Beaver in 7 to 10 days --- [facility], [address] [place] [id number]Please follow-up with Urologist Dr. McAlyson Inglesn about 2 weeks or so --- for possible removal of Foley catheter and voiding trial in his office----[facility] [place], [place]one Number----[phone]  5)Lt Foot Wound VAC with continuous suction at 125 mmHg, wound VAC was last changed on 08/18/2022  6)you need oxygen at home at 2 L via nasal cannula continuously while awake and while asleep--- smoking or having open fires around oxygen can cause fire, significant injury and death   Discharge wound care:   Complete by: As directed    Lt Foot Wound VAC with continuous suction at 125 mmHg, wound VAC was last changed on 08/18/2022- Continue Wound VAC as recommended by podiatrist- if Wound Vac is Not available yet, then ok to temporarily Remove current wound vac dressing. Do wet to dry until wound vac can be applied later. Will need Wound VAC changed 3 times per week. (M, W, Fri) VAC should remain at 125 mmHg of continuous pressure.   Flush IV access with Sodium Chloride 0.9% and Heparin 10 units/ml or 100 units/ml   Complete by: As directed    Home infusion instructions - [facility]   Complete by: As directed    Instructions: Flush IV access with Sodium Chloride 0.9% and Heparin 10units/ml or 100units/ml   Change dressing on IV access line: Weekly and PRN   Instructions Cath Flo 11m29mAdminister for PICC Line occlusion and as ordered by physician for other access device   [facility] pharmacist to adjust dose for: Vancomycin, Aminoglycosides and other anti-infective therapies as requested by physician   Increase activity slowly    Complete by: As directed    Method of administration may be changed at the discretion of home infusion pharmacist based upon assessment of the patient and/or caregiver's ability to self-administer the medication ordered   Complete by: As directed         Discharge Medications     Allergies as of 08/18/2022       Reactions   Phenergan [promethazine] Other (See Comments)   Cramps, shakes.   Augmentin [amoxicillin-pot Clavulanate] Other (See Comments)   UNKNOWN [SEE BELOW] Has patient had a PCN reaction causing immediate rash, facial/tongue/throat swelling, SOB or lightheadedness with hypotension: Unknown Has patient had a PCN reaction causing severe rash involving mucus membranes or skin necrosis: Uknown PATIENT HAS HAD A PCN REACTION THAT REQUIRED HOSPITALIZATION: >> UNSPECIFIED REACTION WHILE HE WAS ALREADY ADMITTED TO A HOSPITAL.  Has patient had a PCN reaction occurring within the last 10 years: No   Oxycodone Other (See Comments)   UNSPECIFIED REACTION  TOLERATES APAP WITHOUT OXYCODONE   Percocet [oxycodone-acetaminophen] Other (See Comments)   Unknown. Tolerates acetaminophen alone.        Medication List     STOP taking these medications    CALCIUM CITRATE PO   celecoxib 200 MG capsule Commonly known as: CELEBREX   doxycycline 100 MG capsule Commonly known as: VIBRAMYCIN   GLUCOSAMINE 1500 COMPLEX PO   omeprazole 20 MG capsule Commonly known as: PRILOSEC   rOPINIRole 2 MG tablet Commonly known as: REQUIP       TAKE these medications    acetaminophen 325 MG tablet Commonly known as: TYLENOL Take 325 mg by mouth every 6 (six) hours as needed for moderate pain.   acidophilus Caps capsule Take 1 capsule by mouth 3 (three) times daily for 10 days.   ALPRAZolam 0.5 MG tablet Commonly known as: Xanax Take 1 tablet (0.5 mg total) by mouth 2 (two) times daily as needed for sleep or anxiety.   aspirin EC 81 MG tablet Take 81 mg by mouth daily. Swallow  whole.   ceFEPime  IVPB Commonly known as: MAXIPIME Inject 2 g into the vein every 8 (eight) hours for 24 days. Indication:  MRSE + PsA L-foot residual osteomyelitis First Dose: Yes Last Day of Therapy:  09/09/22 Labs - Once weekly:  CBC/D and BMP, Labs - Every other week:  ESR and CRP Method of administration: IV Push Method of administration may be changed at the discretion of home infusion pharmacist based upon assessment of the patient and/or caregiver's ability to self-administer the medication ordered.   daptomycin  IVPB Commonly known as: CUBICIN Inject 500 mg into the vein daily for 24 days. Indication:  MRSE + PsA L-foot residual osteomyelitis First Dose: Yes Last Day of Therapy:  09/09/22 Labs - Once weekly:  CBC/D, BMP, and CPK Labs - Every other week:  ESR and CRP Method of administration: IV Push Method of administration may be changed at the discretion of home infusion pharmacist based upon assessment of the patient and/or caregiver's ability to self-administer the medication ordered.   divalproex 125 MG capsule Commonly known as: DEPAKOTE SPRINKLE Take 125 mg by mouth 2 (two) times daily.   feeding supplement Liqd Take 237 mLs by mouth 2 (two) times daily between meals.   nutrition supplement (JUVEN) Pack Take 1 packet by mouth 2 (two) times daily between meals.  fluticasone 50 MCG/ACT nasal spray Commonly known as: FLONASE Place 1 spray into both nostrils daily as needed for allergies.   levofloxacin 750 MG tablet Commonly known as: Levaquin Take 1 tablet (750 mg total) by mouth daily for 14 days. Start taking on: September 10, 2022   linezolid 600 MG tablet Commonly known as: ZYVOX Take 1 tablet (600 mg total) by mouth 2 (two) times daily for 14 days. Start taking on: September 10, 2022   mouth rinse Liqd solution 15 mLs by Mouth Rinse route as needed for up to 10 days (for oral care).   mupirocin ointment 2 % Commonly known as: BACTROBAN Place 1  Application into the nose 2 (two) times daily.   OXYGEN Inhale 2 L into the lungs at bedtime.   polycarbophil 625 MG tablet Commonly known as: FIBERCON Take 1,400 mg by mouth daily.   polyethylene glycol 17 g packet Commonly known as: MIRALAX / GLYCOLAX Take 17 g by mouth 2 (two) times daily. What changed:  when to take this reasons to take this   pramipexole 1 MG tablet Commonly known as: MIRAPEX Take 1 mg by mouth 3 (three) times daily.   PRESERVISION AREDS PO Take 1 tablet by mouth 2 (two) times daily.   senna-docusate 8.6-50 MG tablet Commonly known as: Senokot-S Take 2 tablets by mouth 2 (two) times daily.   simvastatin 40 MG tablet Commonly known as: ZOCOR Take 40 mg by mouth at bedtime. for cholesterol   tamsulosin 0.4 MG Caps capsule Commonly known as: FLOMAX Take 1 capsule (0.4 mg total) by mouth daily after supper.   Vitamin D3 125 MCG (5000 UT) Tabs Take 5,000 Units by mouth every morning.               Discharge Care Instructions  (From admission, onward)           Start     Ordered   08/18/22 0000  Change dressing on IV access line weekly and PRN  (Home infusion instructions - [facility] )        08/18/22 1115   08/18/22 0000  Discharge wound care:       Comments: Lt Foot Wound VAC with continuous suction at 125 mmHg, wound VAC was last changed on 08/18/2022- Continue Wound VAC as recommended by podiatrist- if Wound Vac is Not available yet, then ok to temporarily Remove current wound vac dressing. Do wet to dry until wound vac can be applied later. Will need Wound VAC changed 3 times per week. (M, W, Fri) VAC should remain at 125 mmHg of continuous pressure.   08/18/22 1429            Major procedures and Radiology Reports - PLEASE review detailed and final reports for all details, in brief -   [place] EKG SITE RITE  Result Date: 08/17/2022 If Site Rite image not attached, placement could not be confirmed due to current  cardiac rhythm.  DG Foot Complete Left  Result Date: 08/12/2022 CLINICAL DATA:  Status post amputation. EXAM: LEFT FOOT - COMPLETE 3+ VIEW COMPARISON:  Left foot x-rays dated August 10, 2022. FINDINGS: Interval fifth ray amputation with overlying wound VAC in place. No acute fracture or dislocation. Osteopenia. IMPRESSION: 1. Interval fifth ray amputation. Electronically Signed   By: Titus Dubin M.D.   On: 08/12/2022 16:45   DG Foot 2 Views Left  Result Date: 08/12/2022 CLINICAL DATA:  Osteomyelitis. EXAM: LEFT FOOT - 2 VIEW COMPARISON:  Left foot  x-rays dated August 10, 2022. FLUOROSCOPY TIME:  Radiation Exposure Index (as provided by the fluoroscopic device): 0.06 mGy Kerma C-arm fluoroscopic images were obtained intraoperatively and submitted for post operative interpretation. FINDINGS: Single intraoperative AP view of the midfoot demonstrates further amputation of the fifth metatarsal shaft. IMPRESSION: 1. Intraoperative fluoroscopic guidance for fifth metatarsal amputation. Electronically Signed   By: Titus Dubin M.D.   On: 08/12/2022 16:43   DG C-Arm 1-60 Min-No Report  Result Date: 08/12/2022 Fluoroscopy was utilized by the requesting physician.  No radiographic interpretation.   DG Foot Complete Left  Result Date: 08/10/2022 CLINICAL DATA:  Foot infection, evaluate for osteo EXAM: LEFT FOOT - COMPLETE 3+ VIEW COMPARISON:  05/27/2022 FINDINGS: 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 New soft tissue gas with soft tissue swelling adjacent to the surgical margin. Osteomyelitis is presumed. No fracture or dislocation is seen. The joint spaces are preserved. 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 Adjacent new soft tissue swelling/gas, suggesting soft tissue infection, with presumed osteomyelitis at the surgical margin. Electronically Signed   By: Julian Hy M.D.   On: 08/10/2022 21:37    Micro Results  Recent Results (from the past 240 hour(s))   Culture, blood (Routine X 2) w Reflex to ID Panel     Status: None   Collection Time: 08/10/22  9:59 PM   Specimen: Right Antecubital; Blood  Result Value Ref Range Status   Specimen Description RIGHT ANTECUBITAL  Final   Special Requests   Final    BOTTLES DRAWN AEROBIC AND ANAEROBIC Blood Culture adequate volume   Culture   Final    NO GROWTH 5 DAYS Performed at [facility], [address] [place], [place] [address]    Report Status 08/15/2022 FINAL  Final  Culture, blood (Routine X 2) w Reflex to ID Panel     Status: None   Collection Time: 08/10/22 10:00 PM   Specimen: BLOOD RIGHT HAND  Result Value Ref Range Status   Specimen Description BLOOD RIGHT HAND  Final   Special Requests   Final    BOTTLES DRAWN AEROBIC AND ANAEROBIC Blood Culture adequate volume   Culture   Final    NO GROWTH 5 DAYS Performed at [facility], [address] [place], [place] [address]    Report Status 08/15/2022 FINAL  Final  Surgical PCR screen     Status: None   Collection Time: 08/12/22  6:16 AM   Specimen: Nasal Mucosa; Nasal Swab  Result Value Ref Range Status   MRSA, PCR NEGATIVE NEGATIVE Final   Staphylococcus aureus NEGATIVE NEGATIVE Final    Comment: (NOTE) The Xpert SA Assay (FDA approved for NASAL specimens in patients 69 years of age and older), is one component of a comprehensive surveillance program. It is not intended to diagnose infection nor to guide or monitor treatment. Performed at [facility], [address] [place], [place] [address]   Aerobic/Anaerobic Culture w Gram Stain (surgical/deep wound)     Status: None   Collection Time: 08/12/22  1:42 PM   Specimen: Soft Tissue, Other  Result Value Ref Range Status   Specimen Description   Final    TISSUE SOFT Performed at [facility], [address], [place],  [address]    Special Requests   Final    FOOT LEFT INCISION AND DRAINAGE Performed at [facility], [address], [place], [place] [address]    Gram  Stain NO WBC SEEN RARE GRAM POSITIVE COCCI IN  PAIRS   Final   Culture   Final    RARE STAPHYLOCOCCUS EPIDERMIDIS RARE PSEUDOMONAS AERUGINOSA NO ANAEROBES ISOLATED Performed at [facility], [place] [address], [place], [place] [address]    Report Status 08/17/2022 FINAL  Final   Organism ID, Bacteria STAPHYLOCOCCUS EPIDERMIDIS  Final   Organism ID, Bacteria PSEUDOMONAS AERUGINOSA  Final      Susceptibility   Pseudomonas aeruginosa - MIC*    CEFTAZIDIME 4 SENSITIVE Sensitive     CIPROFLOXACIN <=0.25 SENSITIVE Sensitive     GENTAMICIN <=1 SENSITIVE Sensitive     IMIPENEM 1 SENSITIVE Sensitive     PIP/TAZO 8 SENSITIVE Sensitive     CEFEPIME 2 SENSITIVE Sensitive     LEVOFLOXACIN Value in next row Sensitive      SENSITIVE0.25    * RARE PSEUDOMONAS AERUGINOSA   Staphylococcus epidermidis - MIC*    CIPROFLOXACIN Value in next row Sensitive      SENSITIVE0.25    ERYTHROMYCIN Value in next row Resistant      SENSITIVE0.25    GENTAMICIN Value in next row Sensitive      SENSITIVE0.25    OXACILLIN Value in next row Resistant      SENSITIVE0.25    TETRACYCLINE Value in next row Resistant      SENSITIVE0.25    VANCOMYCIN Value in next row Sensitive      SENSITIVE0.25    TRIMETH/SULFA Value in next row Sensitive      SENSITIVE0.25    CLINDAMYCIN Value in next row Sensitive      SENSITIVE0.25    RIFAMPIN Value in next row Sensitive      SENSITIVE0.25    Inducible Clindamycin Value in next row Sensitive      SENSITIVE0.25    * RARE STAPHYLOCOCCUS EPIDERMIDIS    Today   Subjective    Clinton Gallant today has no new complaints -Urinary retention issues persist -Foley placed No fever  Or chills   No Nausea, Vomiting or Diarrhea   Patient has been seen and examined prior to discharge   Objective   Blood pressure 106/70, pulse 64, temperature 97.6 F (36.4 C), temperature source Oral, resp. rate 16, height _0  (1.702 m), weight 71.2 kg, SpO2 97 %.   Intake/Output Summary  (Last 24 hours) at 08/18/2022 1612 Last data filed at 08/18/2022 1514 Gross per 24 hour  Intake 625.23 ml  Output 1675 ml  Net -1049.77 ml    Exam Gen:- Awake Alert, no acute distress  HEENT:- [place].AT, No sclera icterus Neck-Supple Neck,No JVD,.  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 in situ   Data Review   CBC w Diff:  Lab Results  Component Value Date   WBC 4.8 08/18/2022   HGB 11.1 (L) 08/18/2022   HCT 34.9 (L) 08/18/2022   PLT 171 08/18/2022   LYMPHOPCT 12 08/10/2022   MONOPCT 6 08/10/2022   EOSPCT 2 08/10/2022   BASOPCT 1 08/10/2022    CMP:  Lab Results  Component Value Date   NA 139 08/15/2022   K 4.3 08/15/2022   CL 104 08/15/2022   CO2 30 08/15/2022   BUN 28 (H) 08/15/2022   CREATININE  0.59 (L) 08/17/2022   PROT 7.9 08/10/2022   ALBUMIN 3.8 08/10/2022   BILITOT 0.4 08/10/2022   ALKPHOS 88 08/10/2022   AST 26 08/10/2022   ALT 20 08/10/2022   Total Discharge time is about 33 minutes  Roxan Hockey M.D on 08/18/2022 at 4:12 PM  Go to [email or web address] -  for contact info  [facility] - Office  [phone]

## 2022-08-19 ENCOUNTER — Encounter (HOSPITAL_COMMUNITY): Payer: Self-pay | Admitting: Podiatry

## 2022-09-05 ENCOUNTER — Ambulatory Visit: Payer: Medicare Other | Admitting: Internal Medicine

## 2022-10-07 ENCOUNTER — Ambulatory Visit: Payer: Medicare Other | Admitting: Internal Medicine

## 2022-10-07 ENCOUNTER — Other Ambulatory Visit: Payer: Self-pay

## 2022-10-07 ENCOUNTER — Encounter: Payer: Self-pay | Admitting: Internal Medicine

## 2022-10-07 VITALS — BP 101/63 | HR 79 | Temp 98.1°F | Ht 70.0 in | Wt 166.0 lb

## 2022-10-07 DIAGNOSIS — M86172 Other acute osteomyelitis, left ankle and foot: Secondary | ICD-10-CM

## 2022-10-07 NOTE — Progress Notes (Signed)
   Subjective:    Patient ID: Samuel Olson, male    DOB: Apr 22, 1941, 82 y.o.   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   Culture positive for Pseudomonas and methicillin-resistant Staph epidermidis and he was placed on IV daptomycin and cefepime for 4 weeks with plan for follow up levaquin and linezolid and has now completed prolonged antibiotics for 6 weeks.     Review of Systems  Constitutional:  Negative for chills, fatigue and fever.  Skin:  Negative for rash.       Objective:   Physical Exam Musculoskeletal:     Comments: 5th metatarsal at site of amputation on left now closed, well-healed and no erythema, no warmth.   Neurological:     Mental Status: He is alert.   SH: no tobacco currently        Assessment & Plan:

## 2022-10-07 NOTE — Assessment & Plan Note (Signed)
S/p amputation and prolonged antibiotics now clinically resolved and site looks good now.  No further antibiotics indicated He will follow up as needed.  I have personally spent 30 minutes involved in face-to-face and non-face-to-face activities for this patient on the day of the visit. Professional time spent includes the following activities: Preparing to see the patient (review of tests), Obtaining and/or reviewing separately obtained history (admission/discharge record), Performing a medically appropriate examination and/or evaluation , Ordering medications/tests/procedures, referring and communicating with other health care professionals, Documenting clinical information in the EMR, Independently interpreting results (not separately reported), Communicating results to the patient/family/caregiver, Counseling and educating the patient/family/caregiver and Care coordination (not separately reported).

## 2022-10-08 LAB — COMPLETE METABOLIC PANEL WITH GFR
AG Ratio: 1.8 (calc) (ref 1.0–2.5)
ALT: 13 U/L (ref 9–46)
AST: 17 U/L (ref 10–35)
Albumin: 4.2 g/dL (ref 3.6–5.1)
Alkaline phosphatase (APISO): 65 U/L (ref 35–144)
BUN/Creatinine Ratio: 43 (calc) — ABNORMAL HIGH (ref 6–22)
BUN: 26 mg/dL — ABNORMAL HIGH (ref 7–25)
CO2: 32 mmol/L (ref 20–32)
Calcium: 9.1 mg/dL (ref 8.6–10.3)
Chloride: 103 mmol/L (ref 98–110)
Creat: 0.61 mg/dL — ABNORMAL LOW (ref 0.70–1.22)
Globulin: 2.4 g/dL (calc) (ref 1.9–3.7)
Glucose, Bld: 74 mg/dL (ref 65–99)
Potassium: 4.6 mmol/L (ref 3.5–5.3)
Sodium: 142 mmol/L (ref 135–146)
Total Bilirubin: 0.4 mg/dL (ref 0.2–1.2)
Total Protein: 6.6 g/dL (ref 6.1–8.1)
eGFR: 96 mL/min/{1.73_m2} (ref >=60)

## 2022-10-08 LAB — CBC WITH DIFFERENTIAL/PLATELET
Absolute Monocytes: 410 cells/uL (ref 200–950)
Basophils Absolute: 22 cells/uL (ref 0–200)
Basophils Relative: 0.4 %
Eosinophils Absolute: 221 cells/uL (ref 15–500)
Eosinophils Relative: 4.1 %
HCT: 32.9 % — ABNORMAL LOW (ref 38.5–50.0)
Hemoglobin: 11.1 g/dL — ABNORMAL LOW (ref 13.2–17.1)
Lymphs Abs: 745 cells/uL — ABNORMAL LOW (ref 850–3900)
MCH: 32.2 pg (ref 27.0–33.0)
MCHC: 33.7 g/dL (ref 32.0–36.0)
MCV: 95.4 fL (ref 80.0–100.0)
MPV: 10 fL (ref 7.5–12.5)
Monocytes Relative: 7.6 %
Neutro Abs: 4001 cells/uL (ref 1500–7800)
Neutrophils Relative %: 74.1 %
Platelets: 174 10*3/uL (ref 140–400)
RBC: 3.45 10*6/uL — ABNORMAL LOW (ref 4.20–5.80)
RDW: 12.8 % (ref 11.0–15.0)
Total Lymphocyte: 13.8 %
WBC: 5.4 10*3/uL (ref 3.8–10.8)

## 2023-08-04 DEATH — deceased
